# Patient Record
Sex: Female | Born: 1965
Health system: Southern US, Community
[De-identification: ages and names within clinical notes are randomized; demographics above are authoritative.]

## PROBLEM LIST (undated history)

## (undated) DIAGNOSIS — N209 Urinary calculus, unspecified: Secondary | ICD-10-CM

## (undated) DIAGNOSIS — R011 Cardiac murmur, unspecified: Secondary | ICD-10-CM

## (undated) DIAGNOSIS — K529 Noninfective gastroenteritis and colitis, unspecified: Secondary | ICD-10-CM

## (undated) DIAGNOSIS — N3010 Interstitial cystitis (chronic) without hematuria: Secondary | ICD-10-CM

## (undated) DIAGNOSIS — K589 Irritable bowel syndrome without diarrhea: Secondary | ICD-10-CM

## (undated) DIAGNOSIS — F32A Depression, unspecified: Secondary | ICD-10-CM

## (undated) DIAGNOSIS — I499 Cardiac arrhythmia, unspecified: Secondary | ICD-10-CM

## (undated) DIAGNOSIS — F419 Anxiety disorder, unspecified: Secondary | ICD-10-CM

## (undated) DIAGNOSIS — R51 Headache: Secondary | ICD-10-CM

## (undated) DIAGNOSIS — F329 Major depressive disorder, single episode, unspecified: Secondary | ICD-10-CM

## (undated) DIAGNOSIS — I341 Nonrheumatic mitral (valve) prolapse: Secondary | ICD-10-CM

## (undated) DIAGNOSIS — C801 Malignant (primary) neoplasm, unspecified: Secondary | ICD-10-CM

## (undated) HISTORY — DX: Major depressive disorder, single episode, unspecified: F32.9

## (undated) HISTORY — DX: Depression, unspecified: F32.A

## (undated) HISTORY — DX: Noninfective gastroenteritis and colitis, unspecified: K52.9

## (undated) HISTORY — DX: Malignant (primary) neoplasm, unspecified: C80.1

## (undated) HISTORY — DX: Urinary calculus, unspecified: N20.9

## (undated) HISTORY — PX: TONSILLECTOMY: SUR1361

## (undated) HISTORY — DX: Nonrheumatic mitral (valve) prolapse: I34.1

## (undated) HISTORY — DX: Irritable bowel syndrome, unspecified: K58.9

## (undated) HISTORY — DX: Interstitial cystitis (chronic) without hematuria: N30.10

## (undated) HISTORY — PX: DILATION AND CURETTAGE OF UTERUS: SHX78

---

## 1997-12-12 ENCOUNTER — Emergency Department (HOSPITAL_COMMUNITY): Admission: EM | Admit: 1997-12-12 | Discharge: 1997-12-12 | Payer: Self-pay | Admitting: Emergency Medicine

## 1997-12-21 ENCOUNTER — Emergency Department (HOSPITAL_COMMUNITY): Admission: EM | Admit: 1997-12-21 | Discharge: 1997-12-21 | Payer: Self-pay | Admitting: Emergency Medicine

## 1998-01-08 ENCOUNTER — Other Ambulatory Visit: Admission: RE | Admit: 1998-01-08 | Discharge: 1998-01-08 | Payer: Self-pay | Admitting: Obstetrics & Gynecology

## 1998-06-14 HISTORY — PX: TUBAL LIGATION: SHX77

## 2003-06-15 HISTORY — PX: APPENDECTOMY: SHX54

## 2003-06-15 HISTORY — PX: PARTIAL HYSTERECTOMY: SHX80

## 2003-06-15 HISTORY — PX: TOTAL ABDOMINAL HYSTERECTOMY: SHX209

## 2003-09-03 ENCOUNTER — Encounter: Payer: Self-pay | Admitting: Family Medicine

## 2003-09-13 ENCOUNTER — Encounter: Payer: Self-pay | Admitting: Family Medicine

## 2004-01-21 ENCOUNTER — Encounter: Payer: Self-pay | Admitting: Family Medicine

## 2004-02-07 ENCOUNTER — Encounter: Payer: Self-pay | Admitting: Family Medicine

## 2004-12-14 ENCOUNTER — Encounter: Payer: Self-pay | Admitting: Family Medicine

## 2005-06-14 HISTORY — PX: EXPLORATORY LAPAROTOMY: SUR591

## 2008-07-26 ENCOUNTER — Ambulatory Visit: Payer: Self-pay | Admitting: Family Medicine

## 2008-07-26 DIAGNOSIS — M26629 Arthralgia of temporomandibular joint, unspecified side: Secondary | ICD-10-CM | POA: Insufficient documentation

## 2008-08-17 ENCOUNTER — Ambulatory Visit: Payer: Self-pay | Admitting: Family Medicine

## 2008-08-17 DIAGNOSIS — J069 Acute upper respiratory infection, unspecified: Secondary | ICD-10-CM | POA: Insufficient documentation

## 2008-12-19 ENCOUNTER — Ambulatory Visit: Payer: Self-pay | Admitting: Family Medicine

## 2008-12-19 DIAGNOSIS — R319 Hematuria, unspecified: Secondary | ICD-10-CM | POA: Insufficient documentation

## 2008-12-19 LAB — CONVERTED CEMR LAB
Nitrite: NEGATIVE
Protein, U semiquant: 100
Urobilinogen, UA: 0.2

## 2008-12-29 ENCOUNTER — Ambulatory Visit: Payer: Self-pay | Admitting: Occupational Medicine

## 2008-12-29 DIAGNOSIS — N209 Urinary calculus, unspecified: Secondary | ICD-10-CM | POA: Insufficient documentation

## 2009-03-31 ENCOUNTER — Ambulatory Visit: Payer: Self-pay | Admitting: Occupational Medicine

## 2009-03-31 LAB — CONVERTED CEMR LAB
Bilirubin Urine: NEGATIVE
Glucose, Urine, Semiquant: NEGATIVE
Ketones, urine, test strip: NEGATIVE
Nitrite: NEGATIVE
WBC Urine, dipstick: NEGATIVE

## 2009-04-04 ENCOUNTER — Ambulatory Visit: Payer: Self-pay | Admitting: Family Medicine

## 2009-04-04 LAB — CONVERTED CEMR LAB
Bilirubin Urine: NEGATIVE
Specific Gravity, Urine: 1.025
pH: 6

## 2009-05-10 ENCOUNTER — Ambulatory Visit: Payer: Self-pay | Admitting: Family Medicine

## 2009-05-10 DIAGNOSIS — S60229A Contusion of unspecified hand, initial encounter: Secondary | ICD-10-CM | POA: Insufficient documentation

## 2009-05-16 ENCOUNTER — Encounter: Payer: Self-pay | Admitting: Cardiology

## 2009-05-16 ENCOUNTER — Ambulatory Visit: Payer: Self-pay | Admitting: Family Medicine

## 2009-05-16 ENCOUNTER — Other Ambulatory Visit: Admission: RE | Admit: 2009-05-16 | Discharge: 2009-05-16 | Payer: Self-pay | Admitting: Family Medicine

## 2009-05-16 DIAGNOSIS — I499 Cardiac arrhythmia, unspecified: Secondary | ICD-10-CM | POA: Insufficient documentation

## 2009-05-16 DIAGNOSIS — R002 Palpitations: Secondary | ICD-10-CM | POA: Insufficient documentation

## 2009-05-26 DIAGNOSIS — K259 Gastric ulcer, unspecified as acute or chronic, without hemorrhage or perforation: Secondary | ICD-10-CM | POA: Insufficient documentation

## 2009-05-26 DIAGNOSIS — Z8679 Personal history of other diseases of the circulatory system: Secondary | ICD-10-CM | POA: Insufficient documentation

## 2009-05-28 ENCOUNTER — Ambulatory Visit: Payer: Self-pay | Admitting: Cardiology

## 2009-05-28 ENCOUNTER — Encounter: Payer: Self-pay | Admitting: Cardiology

## 2009-05-28 DIAGNOSIS — F17200 Nicotine dependence, unspecified, uncomplicated: Secondary | ICD-10-CM | POA: Insufficient documentation

## 2009-05-28 DIAGNOSIS — I059 Rheumatic mitral valve disease, unspecified: Secondary | ICD-10-CM | POA: Insufficient documentation

## 2009-06-04 ENCOUNTER — Ambulatory Visit: Payer: Self-pay | Admitting: Internal Medicine

## 2009-06-04 ENCOUNTER — Ambulatory Visit (HOSPITAL_COMMUNITY): Admission: RE | Admit: 2009-06-04 | Discharge: 2009-06-04 | Payer: Self-pay | Admitting: Cardiology

## 2009-06-04 ENCOUNTER — Ambulatory Visit: Payer: Self-pay

## 2009-06-04 ENCOUNTER — Encounter: Payer: Self-pay | Admitting: Cardiology

## 2009-06-04 ENCOUNTER — Ambulatory Visit: Payer: Self-pay | Admitting: Cardiology

## 2009-06-15 ENCOUNTER — Encounter: Payer: Self-pay | Admitting: Family Medicine

## 2009-07-16 ENCOUNTER — Telehealth: Payer: Self-pay | Admitting: Cardiology

## 2009-11-04 ENCOUNTER — Telehealth: Payer: Self-pay | Admitting: Family Medicine

## 2009-11-04 DIAGNOSIS — R197 Diarrhea, unspecified: Secondary | ICD-10-CM | POA: Insufficient documentation

## 2009-11-05 ENCOUNTER — Encounter: Payer: Self-pay | Admitting: Family Medicine

## 2009-11-07 ENCOUNTER — Ambulatory Visit: Payer: Self-pay | Admitting: Family Medicine

## 2009-11-07 LAB — CONVERTED CEMR LAB
Glucose, Urine, Semiquant: NEGATIVE
Nitrite: NEGATIVE
Specific Gravity, Urine: 1.01
WBC Urine, dipstick: NEGATIVE

## 2009-11-11 LAB — CONVERTED CEMR LAB
Alkaline Phosphatase: 62 units/L (ref 39–117)
BUN: 14 mg/dL (ref 6–23)
Basophils Absolute: 0 10*3/uL (ref 0.0–0.1)
Basophils Relative: 0 % (ref 0–1)
Eosinophils Relative: 2 % (ref 0–5)
Glucose, Bld: 90 mg/dL (ref 70–99)
Hemoglobin: 14.4 g/dL (ref 12.0–15.0)
MCHC: 32.4 g/dL (ref 30.0–36.0)
Monocytes Absolute: 1 10*3/uL (ref 0.1–1.0)
Neutro Abs: 7.6 10*3/uL (ref 1.7–7.7)
Platelets: 396 10*3/uL (ref 150–400)
RDW: 13.8 % (ref 11.5–15.5)
Sodium: 141 meq/L (ref 135–145)
Total Bilirubin: 0.6 mg/dL (ref 0.3–1.2)

## 2010-02-04 ENCOUNTER — Telehealth (INDEPENDENT_AMBULATORY_CARE_PROVIDER_SITE_OTHER): Payer: Self-pay | Admitting: *Deleted

## 2010-03-01 ENCOUNTER — Encounter: Payer: Self-pay | Admitting: Family Medicine

## 2010-03-01 DIAGNOSIS — H0469 Other changes of lacrimal passages: Secondary | ICD-10-CM | POA: Insufficient documentation

## 2010-03-06 ENCOUNTER — Ambulatory Visit: Payer: Self-pay | Admitting: Family Medicine

## 2010-03-06 ENCOUNTER — Encounter: Admission: RE | Admit: 2010-03-06 | Discharge: 2010-03-06 | Payer: Self-pay | Admitting: Family Medicine

## 2010-03-06 DIAGNOSIS — N83209 Unspecified ovarian cyst, unspecified side: Secondary | ICD-10-CM | POA: Insufficient documentation

## 2010-03-06 DIAGNOSIS — N200 Calculus of kidney: Secondary | ICD-10-CM | POA: Insufficient documentation

## 2010-03-06 LAB — CONVERTED CEMR LAB
Bilirubin Urine: NEGATIVE
Ketones, urine, test strip: NEGATIVE
Protein, U semiquant: NEGATIVE
Urobilinogen, UA: 0.2

## 2010-03-07 ENCOUNTER — Encounter: Payer: Self-pay | Admitting: Family Medicine

## 2010-04-14 ENCOUNTER — Ambulatory Visit: Payer: Self-pay | Admitting: Emergency Medicine

## 2010-04-14 DIAGNOSIS — M79609 Pain in unspecified limb: Secondary | ICD-10-CM | POA: Insufficient documentation

## 2010-04-14 DIAGNOSIS — T300 Burn of unspecified body region, unspecified degree: Secondary | ICD-10-CM | POA: Insufficient documentation

## 2010-04-15 ENCOUNTER — Telehealth (INDEPENDENT_AMBULATORY_CARE_PROVIDER_SITE_OTHER): Payer: Self-pay | Admitting: *Deleted

## 2010-04-22 ENCOUNTER — Telehealth (INDEPENDENT_AMBULATORY_CARE_PROVIDER_SITE_OTHER): Payer: Self-pay | Admitting: *Deleted

## 2010-04-29 ENCOUNTER — Encounter: Payer: Self-pay | Admitting: Cardiology

## 2010-04-29 ENCOUNTER — Ambulatory Visit: Payer: Self-pay | Admitting: Cardiology

## 2010-05-27 ENCOUNTER — Encounter
Admission: RE | Admit: 2010-05-27 | Discharge: 2010-05-27 | Payer: Self-pay | Source: Home / Self Care | Attending: Family Medicine | Admitting: Family Medicine

## 2010-06-02 LAB — HM MAMMOGRAPHY: HM Mammogram: NORMAL

## 2010-06-14 HISTORY — PX: BILATERAL OOPHORECTOMY: SHX1221

## 2010-07-09 ENCOUNTER — Telehealth (INDEPENDENT_AMBULATORY_CARE_PROVIDER_SITE_OTHER): Payer: Self-pay | Admitting: *Deleted

## 2010-07-12 LAB — CONVERTED CEMR LAB
ALT: 18 units/L (ref 0–35)
AST: 18 units/L (ref 0–37)
Alkaline Phosphatase: 53 units/L (ref 39–117)
BUN: 22 mg/dL (ref 6–23)
Bilirubin Urine: NEGATIVE
Calcium: 9.6 mg/dL (ref 8.4–10.5)
Creatinine, Ser: 0.74 mg/dL (ref 0.40–1.20)
HDL: 64 mg/dL (ref >39)
Ketones, urine, test strip: NEGATIVE
LDL Cholesterol: 84 mg/dL (ref 0–99)
Specific Gravity, Urine: 1.02
TSH: 0.755 microintl units/mL (ref 0.350–4.500)
Total Bilirubin: 0.5 mg/dL (ref 0.3–1.2)
Total CHOL/HDL Ratio: 2.5
VLDL: 10 mg/dL (ref 0–40)
WBC Urine, dipstick: NEGATIVE
pH: 5.5

## 2010-07-14 NOTE — Procedures (Signed)
Summary: [facility]   Imported By: Lanelle Bal 06/26/2009 11:50:57  _____________________________________________________________________  External Attachment:    Type:   Image     Comment:   External Document

## 2010-07-14 NOTE — Assessment & Plan Note (Signed)
Summary: diarrhea   Vital Signs:  Patient profile:   45 year old Shelly Shelly status:  hysterectomy Height:      66 inches Weight:      158 pounds BMI:     Shelly.59 O2 Sat:      99 % on Room air Temp:     98.7 degrees F oral Pulse rate:   80 / minute BP sitting:   117 / 73  (left arm) Cuff size:   regular  Vitals Entered By: Payton Spark CMA (Nov 07, 2009 1:33 PM)  O2 Flow:  Room air CC: Diarrhea and abd cramping x 2 weeks.    Primary Care Provider:  Seymour Bars DO  CC:  Diarrhea and abd cramping x 2 weeks. [name]  History of Present Illness: 45 yo WF presents for 2 wks of watery stools.  She has been having about 6 loose stools in the morning, sometimes 2 in the afternoon.   Having cramps.  No fevers or chills.  She has had blood in the stool on occasion.  She had a colonoscopy 5 yrs ago in [place].  She was told that she had a parasite back then.  She works with [employer] for her occupation.  Her last scope was done at [facility]. She denies any change to her diet or new meds.  Has not taking anything OTC for symptom relief.  Denies recent camping or travel.  No sick contacts.    Current Medications (verified): 1)  Tylenol With Codeine #3 300-30 Mg Tabs (Acetaminophen-Codeine) .... As Needed 2)  Ibuprofen 200 Mg Tabs (Ibuprofen) .... As Needed 3)  Atenolol Shelly Mg Tabs (Atenolol) .... Take One Tablet By Mouth Daily  Allergies (verified): No Known Drug Allergies  Past History:  Past Medical History: Reviewed history from 06/15/2009 and no changes required. STONE, URINARY CALCULUS,UNSPEC. (ICD-592.9) HEMATURIA UNSPECIFIED (ICD-599.70) GASTRIC ULCER (ICD-531.90) cervical cancer Mitral valve prolapse MRI braine 7-06: maxillary mucous retention cyst o/w normal 2D echo 8-05: mild MVP with moderate late MVR  Past Surgical History: Reviewed history from 05/28/2009 and no changes required. Appendectomy Hysterectomy w/o oophorectomy for cancer 08 Tubal  ligation Tonsillectomy  Physical Exam  General:  alert, well-developed, well-nourished, and well-hydrated.   Head:  normocephalic and atraumatic.   Eyes:  sclera non icteric Nose:  no nasal discharge.   Mouth:  pharynx pink and moist.   Neck:  no masses.   Lungs:  Normal respiratory effort, chest expands symmetrically. Lungs are clear to auscultation, no crackles or wheezes. Heart:  Normal rate and regular rhythm. S1 and S2 normal without gallop, murmur, click, rub or other extra sounds. Abdomen:  suprapubic TTP, periumbilical guarding.  ND.  NABS.  No HSM Pulses:  2+ radial pulses Extremities:  no LE edema Skin:  color normal and no rashes.  no jaundice Cervical Nodes:  No lymphadenopathy noted   Impression & Recommendations:  Problem # 1:  DIARRHEA (ICD-787.91) 2 wks of cramping, watery stools.  Stool for O&P neg (given hx of parastite 5 yrs ago).  Checking cx and C. diff toxin.  Will empirically treat with Cipro for probable infectious colitis, possibly food borne illness.  Check CBC and CMP today.  May need refer back to [facility] for scoping if failing to improve to r/o autoimmune etiology. Treat symptoms with Bentyl and anti emetics.  Bland diet, clear fluid rehydration.  No rebound tachycardia or wt loss seen thus far. Orders: T-Clostridium difficile Toxin A/B ([id number]) T-CBC w/Diff ([id number]) T-Comprehensive  Metabolic Panel [phone])  Complete Medication List: 1)  Tylenol With Codeine #3 300-30 Mg Tabs (Acetaminophen-codeine) .... As needed 2)  Ibuprofen 200 Mg Tabs (Ibuprofen) .... As needed 3)  Atenolol Shelly Mg Tabs (Atenolol) .... Take one tablet by mouth daily 4)  Dicyclomine Hcl 20 Mg Tabs (Dicyclomine hcl) .[name].. 1 tab by mouth three times a day as needed abdominal cramping 5)  Promethazine Hcl Shelly Mg Tabs (Promethazine hcl) .[name].. 1 tab by mouth q 6 hrs as needed nausea 6)  Ciprofloxacin Hcl 500 Mg Tabs (Ciprofloxacin hcl) .[name].. 1 tab by mouth q 12 hrs x 5  days  Other Orders: UA Dipstick w/o Micro (automated)  ([id number])  Patient Instructions: 1)  Labs downstairs today. 2)  Will f/u the rest of your stool studies on Tuesday. 3)  Bland diet, clear fluids. 4)  Use Dicyclomine for cramping and Phenergan for nausea. 5)  Will go ahead and empirically treat for infectious causes with Cipro x 5 days.   6)  REturn for f/u in 10 days. Prescriptions: CIPROFLOXACIN HCL 500 MG TABS (CIPROFLOXACIN HCL) 1 tab by mouth q 12 hrs x 5 days  #10 x 0   Entered and Authorized by:   Seymour Bars DO   Signed by:   Seymour Bars DO on 11/07/2009   Method used:   Electronically to        [facility] [phone]* (retail)       [address]       [place], [place]  [address]       Ph: [phone] or [phone]       Fax: [phone]   RxID:   [id number] PROMETHAZINE HCL Shelly MG TABS (PROMETHAZINE HCL) 1 tab by mouth q 6 hrs as needed nausea  #24 x 0   Entered and Authorized by:   Seymour Bars DO   Signed by:   Seymour Bars DO on 11/07/2009   Method used:   Electronically to        [facility] [phone]* (retail)       [address]       [place], [place]  [address]       Ph: [phone] or [phone]       Fax: [phone]   RxID:   [id number] DICYCLOMINE HCL 20 MG TABS (DICYCLOMINE HCL) 1 tab by mouth three times a day as needed abdominal cramping  #30 x 0   Entered and Authorized by:   Seymour Bars DO   Signed by:   Seymour Bars DO on 11/07/2009   Method used:   Electronically to        [facility] [phone]* (retail)       [address] [place], [place]  [address]       Ph: [phone] or [phone]       Fax: [phone]   RxID:   [id number]   Laboratory Results   Urine Tests    Routine Urinalysis   Color: yellow Appearance: Clear Glucose: negative   (Normal Range: Negative) Bilirubin: negative   (Normal Range: Negative) Ketone: negative   (Normal Range: Negative) Spec. Gravity: 1.010   (Normal Range:  1.003-1.035) Blood: large   (Normal Range: Negative) pH: 6.0   (Normal Range: 5.0-8.0) Protein: negative   (Normal Range: Negative) Urobilinogen: 0.2   (Normal Range: 0-1) Nitrite: negative   (Normal Range: Negative) Leukocyte Esterace: negative   (Normal Range:  Negative)

## 2010-07-14 NOTE — Progress Notes (Signed)
  Phone Note Call from Patient   Caller: Patient Call For: [name] Reason for Call: Talk to Doctor Summary of Call: Patient called into office today to speak with nurse.   Patient cancelled her appointment today with Dr. Jens Som due to be called out of town for work.  Patient had holter monitor and turned it in 2 weeks ago.  Patient wanted to know if the results are in yet.  Please call patient at [phone].  When you call her she will reschedule the appointment.   Initial call taken by: Haskell Riling  Follow-up for Phone Call        spoke with pt, monitor was reviewed by dr Jens Som, pt aware that monitor shows sinus with PVC's.the pt states she still will occ have episodes where her heart beats real fast. she states dr Jens Som had mentioned using atenolol for her palpitations. she does not want to make a follow up appt due to insurance issues and wonders if he wants to try atenolol if she needs it. will foward to dr Jens Som for his review Shelly Goody, RN  July 16, 2009 3:39 PM  Follow-up by: Shelly Goody, RN,  July 16, 2009 3:31 PM  Additional Follow-up for Phone Call Additional follow up Details #1::        Begin atenolol 25 mg by mouth daily Ferman Hamming, MD, [facility]  July 16, 2009 4:12 PM  pt aware and will call with any problems Shelly Goody, RN  July 16, 2009 4:40 PM     New/Updated Medications: ATENOLOL 25 MG TABS (ATENOLOL) Take one tablet by mouth daily Prescriptions: ATENOLOL 25 MG TABS (ATENOLOL) Take one tablet by mouth daily  #30 x 12   Entered by:   Shelly Goody, RN   Authorized by:   Ferman Hamming, MD, [facility]   Signed by:   Shelly Goody, RN on 07/16/2009   Method used:   Electronically to        [facility] [phone]* (retail)       [address]       [place], [place]  [address]       Ph: [phone] or [phone]       Fax: [phone]   RxID:   [id number]

## 2010-07-14 NOTE — Progress Notes (Signed)
  Phone Note Outgoing Call Call back at [facility] Phone [phone]   Call placed by: Emilio Math,  April 22, 2010 11:55 AM Call placed to: Patient Summary of Call: Foot is doing a lot better!

## 2010-07-14 NOTE — Assessment & Plan Note (Signed)
Summary: [facility]   Visit Type:  Follow-up Primary Provider:  Seymour Bars DO   History of Present Illness: 45 yo female I saw in Dec 2010 for evaluation of palpitations and MVP. Echocardiogram in December of 2010 showed normal LV function, mild left ventricular hypertrophy, mild prolapse of the anterior mitral valve leaflet and mild mitral regurgitation. Since I last saw her, she denies dyspnea and chest pain. Palpitations much improved on atenolol. Occasional "skip" but no sustained palpitations.  Current Medications (verified): 1)  Atenolol 25 Mg Tabs (Atenolol) .... Take One Tablet By Mouth Daily  Allergies (verified): No Known Drug Allergies  Past History:  Past Medical History: STONE, URINARY CALCULUS,UNSPEC. (ICD-592.9) GASTRIC ULCER (ICD-531.90) cervical cancer Mitral valve prolapse  Past Surgical History: Reviewed history from 05/28/2009 and no changes required. Appendectomy Hysterectomy w/o oophorectomy for cancer 08 Tubal ligation Tonsillectomy  Social History: Reviewed history from 05/28/2009 and no changes required. Works for [employer]. smoke 1 pack per day for 20 years drinks 2 drinks per week denies recreational drug use No exercise. Engaged to  [employer].  Has 2 grown kids.  Review of Systems       no fevers or chills, productive cough, hemoptysis, dysphasia, odynophagia, melena, hematochezia, dysuria, hematuria, rash, seizure activity, orthopnea, PND, pedal edema, claudication. Remaining systems are negative.   Vital Signs:  Patient profile:   45 year old female Menstrual status:  hysterectomy Height:      66 inches Weight:      164.75 pounds BMI:     26.69 Pulse rate:   80 / minute Pulse rhythm:   regular Resp:     18 per minute BP sitting:   121 / 75  (left arm) Cuff size:   large  Vitals Entered By: Vikki Ports (April 29, 2010 4:22 PM)  Physical Exam  General:  Well-developed well-nourished in no acute distress.  Skin  is warm and dry.  HEENT is normal.  Neck is supple. No thyromegaly.  Chest is clear to auscultation with normal expansion.  Cardiovascular exam is regular rate and rhythm.  Abdominal exam nontender or distended. No masses palpated. Extremities show no edema. neuro grossly intact    EKG  Procedure date:  04/29/2010  Findings:      Normal sinus rhythm at a rate of 80. No ST changes.  Impression & Recommendations:  Problem # 1:  TOBACCO ABUSE (ICD-305.1) Patient instructed on importance of discontinuing.  Problem # 2:  MITRAL VALVE PROLAPSE (ICD-424.0)  Will need f/u echocardiograms in the future. Her updated medication list for this problem includes:    Atenolol 50 Mg Tabs (Atenolol) .[name]... Take one tablet by mouth daily  Her updated medication list for this problem includes:    Atenolol 50 Mg Tabs (Atenolol) .[name]... Take one tablet by mouth daily  Problem # 3:  PALPITATIONS (ICD-785.1)  Much improved on atenolol. Continue at present dose. Her updated medication list for this problem includes:    Atenolol 50 Mg Tabs (Atenolol) .[name]... Take one tablet by mouth daily  Her updated medication list for this problem includes:    Atenolol 50 Mg Tabs (Atenolol) .[name]... Take one tablet by mouth daily  Patient Instructions: 1)  Your physician wants you to follow-up in: one year  You will receive a reminder letter in the mail two months in advance. If you don't receive a letter, please call our office to schedule the follow-up appointment. Prescriptions: ATENOLOL 50 MG TABS (ATENOLOL) Take one tablet by mouth daily  #  90 x 4   Entered by:   [name], RN   Authorized by:   Ferman Hamming, MD, [facility]   Signed by:   [name], RN on 04/29/2010   Method used:   Electronically to        [facility] [phone]* (retail)       [address] [place], [place]  [address]       Ph: [phone]       Fax: [phone]   RxID:   [id number]

## 2010-07-14 NOTE — Progress Notes (Signed)
Summary: Cramping and diarrhea  Phone Note Call from Patient   Caller: Patient Summary of Call: Pt called stating she has had stomach cramping and diarrhea x 3 days. Pt states she has had the same symptoms when she had stones and when she had a parasite. Pt will come today to leave [facility]A and to pick up cup for stool culture and then she will return for OV on Fri. Pt unable to get off of work before then.  Initial call taken by: Payton Spark CMA,  Nov 04, 2009 1:16 PM  Follow-up for Phone Call        ok with plan, still needs OV Fri Follow-up by: Seymour Bars DO,  Nov 04, 2009 2:00 PM  New Problems: DIARRHEA (ICD-787.91)   New Problems: DIARRHEA (ICD-787.91)  Appended Document: Cramping and diarrhea Pt aware

## 2010-07-14 NOTE — Assessment & Plan Note (Signed)
Summary: UTI/TM  (left arm) Cuff size:   regular  Vitals Entered By: Areta Haber CMA (March 01, 2010 1:13 PM)                  Current Allergies: No known allergies History of Present Illness Chief Complaint: Frequent, painful urination x History of Present Illness:  Subjective:  Patient complains of 10 day history of mucous discharge from her right eye each morning when she awakens.  There has been no eye redness, swelling, foreign body sensation, itching, burning, or changes in vision.  After she arises, the symptom resolves.  She wears contacts, but only during the day.  She has a history of seasonal allergies, but usually only in the springtime.  She notes that she has been having some mild sinus congestion at night although it alternates between the two nares.  Current Problems: OTHER CHANGE OF LACRIMAL PASSAGES (ICD-375.69) DIARRHEA (ICD-787.91) TOBACCO ABUSE (ICD-305.1) MITRAL VALVE PROLAPSE (ICD-424.0) HEART MURMUR, HX OF (ICD-V12.50) PALPITATIONS (ICD-785.1) ROUTINE GYNECOLOGICAL EXAMINATION (ICD-V72.31) OTHER SCREENING MAMMOGRAM (ICD-V76.12) OTH&UNSPEC ENDOCRN NUTRIT METAB&IMMUNITY D/O (ICD-V77.99) SCREENING FOR LIPOID DISORDERS (ICD-V77.91) UNSPECIFIED CARDIAC DYSRHYTHMIA (ICD-427.9) CONTUSION, LEFT HAND (ICD-923.20) STONE, URINARY CALCULUS,UNSPEC. (ICD-592.9) HEMATURIA UNSPECIFIED (ICD-599.70) URI (ICD-465.9) TMJ PAIN (ICD-524.62) GASTRIC ULCER (ICD-531.90)   Current Meds TYLENOL WITH CODEINE #3 300-30 MG TABS (ACETAMINOPHEN-CODEINE) as needed IBUPROFEN 800 MG TABS (IBUPROFEN) Take 1 tab by mouth three times a day with food as needed ATENOLOL 25 MG TABS (ATENOLOL) Take one tablet by mouth daily  REVIEW OF SYSTEMS Constitutional Symptoms      Denies fever, chills, night sweats, weight loss, weight gain, and fatigue.  Eyes       Denies change in vision, eye pain, eye discharge, glasses, contact lenses, and eye surgery. Ear/Nose/Throat/Mouth  Denies hearing loss/aids, change in hearing, ear pain, ear discharge, dizziness, frequent runny nose, frequent nose bleeds, sinus problems, sore throat, hoarseness, and tooth pain or bleeding.  Respiratory       Denies dry cough, productive cough, wheezing, shortness of breath, asthma, bronchitis, and emphysema/COPD.  Cardiovascular       Denies murmurs, chest pain, and tires easily with exhertion.    Gastrointestinal       Complains of stomach pain.      Denies nausea/vomiting, diarrhea, constipation, blood in bowel movements, and indigestion. Genitourniary       Complains of painful urination.      Denies kidney stones and loss of urinary control. Neurological       Denies paralysis, seizures, and fainting/blackouts. Musculoskeletal       Denies muscle pain, joint pain, joint stiffness, decreased range of motion, redness, swelling, muscle weakness, and gout.  Skin       Denies bruising, unusual mles/lumps or sores, and hair/skin or nail changes.  Psych       Denies mood changes, temper/anger issues, anxiety/stress, speech problems, depression, and sleep problems.  Past History:  Past Medical History: Last updated: 06/15/2009 STONE, URINARY CALCULUS,UNSPEC. (ICD-592.9) HEMATURIA UNSPECIFIED (ICD-599.70) GASTRIC ULCER (ICD-531.90) cervical cancer Mitral valve prolapse MRI braine 7-06: maxillary mucous retention cyst o/w normal 2D echo 8-05: mild MVP with moderate late MVR  Past Surgical History: Last updated: 05/28/2009 Appendectomy Hysterectomy w/o oophorectomy for cancer 08 Tubal ligation Tonsillectomy  Family History: Last updated: 05/28/2009 mother alive age 70 healthy father alive age 60 healthy sister alive age 55 healthy No premature CAD  Social History: Last updated: 05/28/2009 Works for [employer]. smoke 1 pack per day for 20 years drinks 2  drinks per week denies recreational drug use No exercise. Engaged to  [employer].  Has 2 grown kids.   Objective:    Appearance:  Patient appears healthy, stated age, and in no acute distress  Eyes:  Pupils are equal, round, and reactive to light and accomdation.  Extraocular movement is intact.  Conjunctivae are not inflamed.  Fundi benign.  No lid swelling, eythema, or tenderness.  No discharge present. Ears:  Canals normal.  Tympanic membranes normal.   Nose:  Mildly congested turbinates on the right.  No sinus tenderness Pharynx:  Normal  Neck:  Supple.  No adenopathy is present.  No thyromegaly is present  Assessment New Problems: OTHER CHANGE OF LACRIMAL PASSAGES (ICD-375.69)  SUSPECT ALLERGIC RHINITITIS AND SINUS CONGESTION WITH CONSEQUENT LACRIMAL DUCT CONGESTION.  Plan New Orders: Est. Patient Level III [99213] Planning Comments:   Trial of Afrin nasal spray for about 5 days.  Begin saline nasal irrigation.  Recommend that she resume her allergy med (Allegra, Claritin, Zyrtec, etc).  Recommend that she massage her tear duct several times daily.  May use Refresh tears as needed. Follow-up with ophthalmologist if not improving or if symptoms worsen.   The patient and/or caregiver has been counseled thoroughly with regard to medications prescribed including dosage, schedule, interactions, rationale for use, and possible side effects and they verbalize understanding.  Diagnoses and expected course of recovery discussed and will return if not improved as expected or if the condition worsens. Patient and/or caregiver verbalized understanding.   Orders Added: 1)  Est. Patient Level III [40981]  Appended Document: UTI/TM Disregard above chart entries (and diagnosis): wrong patient data

## 2010-07-14 NOTE — Progress Notes (Signed)
  Phone Note Outgoing Call Call back at [facility] Phone [phone]   Call placed by: Lajean Saver RN,  April 15, 2010 12:21 PM Call placed to: Patient Action Taken: Appt scheduled Summary of Call: Spoke with patient, notified her of appointment with Dr. Lajoyce Corners on 04/30/10 @ 8:45AM.

## 2010-07-14 NOTE — Assessment & Plan Note (Signed)
Summary: BURN ON TOP OF FOOT/TJ   Vital Signs:  Patient Profile:   45 Years Old Female CC:      left foot chemical burn x 1 week Height:     66 inches Weight:      163 pounds O2 Sat:      98 % O2 treatment:    Room Air Temp:     98.7 degrees F oral Pulse rate:   84 / minute Resp:     12 per minute BP sitting:   131 / 80  (left arm) Cuff size:   regular  Pt. in pain?   yes    Location:   left foot    Type:       burning  Vitals Entered By: Lajean Saver RN (April 14, 2010 7:00 PM)                   Updated Prior Medication List: ATENOLOL 25 MG TABS (ATENOLOL) Take one tablet by mouth daily DOXYCYCLINE HYCLATE 100 MG CAPS (DOXYCYCLINE HYCLATE) 1 cap by mouth two times a day for 10 days SILVADENE 1 % CREA (SILVER SULFADIAZINE) apply to wound on the top of your L foot twice a day ULTRACET 37.5-325 MG TABS (TRAMADOL-ACETAMINOPHEN) 1 tab by mouth Q6 hours as needed for pain  Current Allergies: No known allergies History of Present Illness History from: patient Chief Complaint: left foot chemical burn x 1 week History of Present Illness: Top of L foot burned 1 week ago with NaOH.  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  She wears boots but it must have soaked through and sat there for 20 mins or so.  She washed it off when she realized what happened.  She has burned herself in the past on her arm and other foot, but this was worse.  She hasn't seeked out any medical care.  She has been using topical neosprorin.   It is still red but is slightly better compared to a few days ago.  She states that it is painful and she is concerned that the bone is damaged.  Her "tendons feel tight" also.  No F/C/N/V.  Last Td was Dec '10.   REVIEW OF SYSTEMS Constitutional Symptoms      Denies fever, chills, night sweats, weight loss, weight gain, and fatigue.  Eyes       Denies change in vision, eye pain, eye discharge, glasses, contact lenses,  and eye surgery. Ear/Nose/Throat/Mouth       Denies hearing loss/aids, change in hearing, ear pain, ear discharge, dizziness, frequent runny nose, frequent nose bleeds, sinus problems, sore throat, hoarseness, and tooth pain or bleeding.  Respiratory       Denies dry cough, productive cough, wheezing, shortness of breath, asthma, bronchitis, and emphysema/COPD.  Cardiovascular       Denies murmurs, chest pain, and tires easily with exhertion.    Gastrointestinal       Denies stomach pain, nausea/vomiting, diarrhea, constipation, blood in bowel movements, and indigestion. Genitourniary       Denies painful urination, kidney stones, and loss of urinary control. Neurological       Denies paralysis, seizures, and fainting/blackouts. Musculoskeletal       Denies muscle pain, joint pain, joint stiffness, decreased range of motion, redness, swelling, muscle weakness, and gout.  Skin       Denies bruising, unusual mles/lumps or sores, and hair/skin or nail changes.  Comments: chemical burn to top of left foot Psych       Denies mood changes, temper/anger issues, anxiety/stress, speech problems, depression, and sleep problems. Other Comments: Last tetanus 05/2009   Past History:  Past Medical History: Reviewed history from 03/06/2010 and no changes required. STONE, URINARY CALCULUS,UNSPEC. (ICD-592.9) GASTRIC ULCER (ICD-531.90) cervical cancer Mitral valve prolapse MRI braine 7-06: maxillary mucous retention cyst o/w normal 2D echo 8-05: mild MVP with moderate late MVR  Past Surgical History: Reviewed history from 05/28/2009 and no changes required. Appendectomy Hysterectomy w/o oophorectomy for cancer 08 Tubal ligation Tonsillectomy  Family History: Reviewed history from 05/28/2009 and no changes required. mother alive age 28 healthy father alive age 29 healthy sister alive age 95 healthy No premature CAD  Social History: Reviewed history from 05/28/2009 and no changes  required. Works for [employer]. smoke 1 pack per day for 20 years drinks 2 drinks per week denies recreational drug use No exercise. Engaged to  [employer].  Has 2 grown kids. Physical Exam General appearance: well developed, well nourished, no acute distress Heart: normal pulses Extremities: see below Neurological: normal distal sensatio Skin: see below MSE: oriented to time, place, and person L ankle: FROM, normal exam, resisted motions are mildly tender. L foot: TTP dorsal aspect with healing granulation tissue approx 4x4cm, small amount of erythema.  No active bleeding or drainage.  TTP over area.  Resisted great toe flexion is painful, resisted foot extension and toe extension also painful. Assessment New Problems: CHEMICAL BURN (ICD-949.0) FOOT PAIN, LEFT (ICD-729.5)  Xray is normal  Plan New Medications/Changes: ULTRACET 37.5-325 MG TABS (TRAMADOL-ACETAMINOPHEN) 1 tab by mouth Q6 hours as needed for pain  #20 x 0, 04/14/2010, Hoyt Koch MD SILVADENE 1 % CREA (SILVER SULFADIAZINE) apply to wound on the top of your L foot twice a day  #QS x2 wks x 0, 04/14/2010, Hoyt Koch MD DOXYCYCLINE HYCLATE 100 MG CAPS (DOXYCYCLINE HYCLATE) 1 cap by mouth two times a day for 10 days  #20 x 0, 04/14/2010, Hoyt Koch MD  New Orders: Est. Patient Level IV [16109] T-DG Foot Complete*L* [73630] Ace  Bandage < 3in. [U0454] Planning Comments:   Last Td was 1 year ago.  Will treat with Silvadene cream & Doxy for 10 days.  Refer to Dr. Lajoyce Corners for foot wound follow up.  If she's doing a lot better, can cancel appt.  Ultracet for pain but should expect pain anyway for 1-2 weeks.  I cannot predict a scar, but will likely have a small one.  The wound will probably take a few months to completely heal up.  She should have regular physician care until it is all better.  If worsening, f/u with PCP or ER.   The patient and/or caregiver has been counseled thoroughly with regard to  medications prescribed including dosage, schedule, interactions, rationale for use, and possible side effects and they verbalize understanding.  Diagnoses and expected course of recovery discussed and will return if not improved as expected or if the condition worsens. Patient and/or caregiver verbalized understanding.   PROCEDURE:  Dressing Site: L top of foot Procedure: Covered with silvadene, gauze, and wrapped with ACE bandage Prescriptions: ULTRACET 37.5-325 MG TABS (TRAMADOL-ACETAMINOPHEN) 1 tab by mouth Q6 hours as needed for pain  #20 x 0   Entered and Authorized by:   Hoyt Koch MD   Signed by:   Hoyt Koch MD on 04/14/2010   Method used:   Print then Give to Patient   RxID:  [id number] SILVADENE 1 % CREA (SILVER SULFADIAZINE) apply to wound on the top of your L foot twice a day  #QS x2 wks x 0   Entered and Authorized by:   Hoyt Koch MD   Signed by:   Hoyt Koch MD on 04/14/2010   Method used:   Print then Give to Patient   RxID:   [id number] DOXYCYCLINE HYCLATE 100 MG CAPS (DOXYCYCLINE HYCLATE) 1 cap by mouth two times a day for 10 days  #20 x 0   Entered and Authorized by:   Hoyt Koch MD   Signed by:   Hoyt Koch MD on 04/14/2010   Method used:   Print then Give to Patient   RxID:   [id number]   Orders Added: 1)  Est. Patient Level IV [71696] 2)  T-DG Foot Complete*L* [73630] 3)  Ace  Bandage < 3in. [V8938]

## 2010-07-14 NOTE — Progress Notes (Signed)
       New/Updated Medications: IBUPROFEN 800 MG TABS (IBUPROFEN) Take 1 tab by mouth three times a day with food as needed Prescriptions: IBUPROFEN 800 MG TABS (IBUPROFEN) Take 1 tab by mouth three times a day with food as needed  #60 x 0   Entered by:   Payton Spark CMA   Authorized by:   Seymour Bars DO   Signed by:   Payton Spark CMA on 02/04/2010   Method used:   Electronically to        [facility] [phone] (retail)       [address]       [place], [place]  [address]       Ph: [phone] or [phone]       Fax: [phone]   RxID:   [id number]

## 2010-07-14 NOTE — Assessment & Plan Note (Signed)
Summary: Incorrect Chart Entries  Disregard previous chart entries dated 03/01/2010:  wrong patient. Donna Christen MD  March 01, 2010 2:54 PM

## 2010-07-14 NOTE — Letter (Signed)
Summary: [facility]   Imported By: Lanelle Bal 06/26/2009 11:49:42  _____________________________________________________________________  External Attachment:    Type:   Image     Comment:   External Document

## 2010-07-14 NOTE — Assessment & Plan Note (Signed)
Summary: ED f/u   Vital Signs:  Patient profile:   45 year old female Menstrual status:  hysterectomy Height:      66 inches Weight:      162 pounds BMI:     26.24 O2 Sat:      100 % on Room air Temp:     98.4 degrees F oral Pulse rate:   69 / minute BP sitting:   125 / 79  (left arm) Cuff size:   regular  Vitals Entered By: Payton Spark CMA (March 06, 2010 2:20 PM)  O2 Flow:  Room air CC: F/U ED ovarian cyst and ? kidney stone   Primary Care Provider:  Seymour Bars DO  CC:  F/U ED ovarian cyst and ? kidney stone.  History of Present Illness: 45 yo WF presents for HFU visit.  She was diagnosed with a L ovarian cyst based on a CT scan that showed maybe a kidney stone.  She is s/p TAH but still has her ovaries.  Denies vaginal bleeding.  Using Ibuprofen and Tylenol #3.  L pelvic pain is almost completeley resolved.  Yesterday, she started having R and midline pelvic pain with urinary frequency and dysuria.  Denies gross hematuria, fevers, chills or flank pain.      Current Medications (verified): 1)  Tylenol With Codeine #3 300-30 Mg Tabs (Acetaminophen-Codeine) .... As Needed 2)  Ibuprofen 800 Mg Tabs (Ibuprofen) .... Take 1 Tab By Mouth Three Times A Day With Food As Needed 3)  Atenolol 25 Mg Tabs (Atenolol) .... Take One Tablet By Mouth Daily  Allergies (verified): No Known Drug Allergies  Past History:  Past Medical History: STONE, URINARY CALCULUS,UNSPEC. (ICD-592.9) GASTRIC ULCER (ICD-531.90) cervical cancer Mitral valve prolapse MRI braine 7-06: maxillary mucous retention cyst o/w normal 2D echo 8-05: mild MVP with moderate late MVR  Past Surgical History: Reviewed history from 05/28/2009 and no changes required. Appendectomy Hysterectomy w/o oophorectomy for cancer 08 Tubal ligation Tonsillectomy  Social History: Reviewed history from 05/28/2009 and no changes required. Works for [employer]. smoke 1 pack per day for 20 years drinks 2 drinks  per week denies recreational drug use No exercise. Engaged to  [employer].  Has 2 grown kids.  Review of Systems      See HPI  Physical Exam  General:  alert, well-developed, well-nourished, and well-hydrated.   Head:  normocephalic and atraumatic.   Eyes:  sclera non icteric Mouth:  pharynx pink and moist.   Neck:  no masses.   Lungs:  Normal respiratory effort, chest expands symmetrically. Lungs are clear to auscultation, no crackles or wheezes. Heart:  Normal rate and regular rhythm. S1 and S2 normal without gallop, murmur, click, rub or other extra sounds. Abdomen:  RLQ TTP with vol guarding, suprapubic TTP.  No LLQ TTP or guarding.  soft.  no CVAT.   Extremities:  no LE edema Skin:  color normal.   Cervical Nodes:  No lymphadenopathy noted Psych:  good eye contact, not anxious appearing, and not depressed appearing.     Impression & Recommendations:  Problem # 1:  NEPHROLITHIASIS (ICD-592.0) Has Tylenol #3 and RX Ibuprofen.  UA is ++ for large blood. KUB to look for recurrent stone, Ucx to look for infection. If not improving, refer to urology. Orders: T-DG ABD 1 View (74000)  Problem # 2:  HEMATURIA UNSPECIFIED (ICD-599.70) Kidney stone vs UTI with hx of stones. Ucx sent.  Empirically cover with Macrobid.  KUB today to look for radio -  opague stone given quantity of RLQ pain today.   Her updated medication list for this problem includes:    Nitrofurantoin Monohyd Macro 100 Mg Caps (Nitrofurantoin monohyd macro) .[name]... 1 capsule by mouth two times a day x 7 days  Orders: UA Dipstick w/o Micro (automated)  (81003) T-Culture, Urine ([id number])  Problem # 3:  OVARIAN CYST (ICD-620.2) L ovarian cyst, confirmed on CT scan in the ED. Spent 10 min reviewing records. Pain is much improved.    Complete Medication List: 1)  Tylenol With Codeine #3 300-30 Mg Tabs (Acetaminophen-codeine) .... As needed 2)  Ibuprofen 800 Mg Tabs (Ibuprofen) .... Take 1 tab by mouth three times  a day with food as needed 3)  Atenolol 25 Mg Tabs (Atenolol) .... Take one tablet by mouth daily 4)  Nitrofurantoin Monohyd Macro 100 Mg Caps (Nitrofurantoin monohyd macro) .[name].. 1 capsule by mouth two times a day x 7 days  Patient Instructions: 1)  Xray abdomen today. 2)  Will call you w/ results tonight. 3)  Use RX pain meds/ ibuprofen as needed. 4)  Will culture your urine, but go ahead and start on Macrobid to cover for infection. Prescriptions: NITROFURANTOIN MONOHYD MACRO 100 MG CAPS (NITROFURANTOIN MONOHYD MACRO) 1 capsule by mouth two times a day x 7 days  #14 x 0   Entered and Authorized by:   Seymour Bars DO   Signed by:   Seymour Bars DO on 03/06/2010   Method used:   Electron[facility] to        [facility] [phone]* (retail)       [address] [place], [place]  [address]       Ph: [phone] or [phone]       Fax: [phone]   RxID:   [id number]   Laboratory Results   Urine Tests    Routine Urinalysis   Color: yellow Appearance: Clear Glucose: negative   (Normal Range: Negative) Bilirubin: negative   (Normal Range: Negative) Ketone: negative   (Normal Range: Negative) Spec. Gravity: 1.020   (Normal Range: 1.003-1.035) Blood: large   (Normal Range: Negative) pH: 6.5   (Normal Range: 5.0-8.0) Protein: negative   (Normal Range: Negative) Urobilinogen: 0.2   (Normal Range: 0-1) Nitrite: negative   (Normal Range: Negative) Leukocyte Esterace: negative   (Normal Range: Negative)

## 2010-07-14 NOTE — Miscellaneous (Signed)
Summary: old records  Clinical Lists Changes  Observations: Added new observation of PAST MED HX: STONE, URINARY CALCULUS,UNSPEC. (ICD-592.9) HEMATURIA UNSPECIFIED (ICD-599.70) GASTRIC ULCER (ICD-531.90) cervical cancer Mitral valve prolapse MRI braine 7-06: maxillary mucous retention cyst o/w normal 2D echo 8-05: mild MVP with moderate late MVR (06/15/2009 13:11) Added new observation of PRIMARY MD: Seymour Bars DO (06/15/2009 13:11)       Past History:  Past Medical History: STONE, URINARY CALCULUS,UNSPEC. (ICD-592.9) HEMATURIA UNSPECIFIED (ICD-599.70) GASTRIC ULCER (ICD-531.90) cervical cancer Mitral valve prolapse MRI braine 7-06: maxillary mucous retention cyst o/w normal 2D echo 8-05: mild MVP with moderate late MVR

## 2010-07-16 NOTE — Progress Notes (Signed)
       New/Updated Medications: IBUPROFEN 800 MG TABS (IBUPROFEN) Take 1 tab by mouth three times a day  as needed Prescriptions: IBUPROFEN 800 MG TABS (IBUPROFEN) Take 1 tab by mouth three times a day  as needed  #60 x 1   Entered by:   Payton Spark CMA   Authorized by:   Seymour Bars DO   Signed by:   Payton Spark CMA on 07/09/2010   Method used:   Electronically to        [facility] [phone]* (retail)       [address]       [place], [place]  [address]       Ph: [phone] or [phone]       Fax: [phone]   RxID:   [id number]

## 2010-11-06 ENCOUNTER — Encounter: Payer: Self-pay | Admitting: Family Medicine

## 2010-11-06 ENCOUNTER — Ambulatory Visit (INDEPENDENT_AMBULATORY_CARE_PROVIDER_SITE_OTHER): Payer: BC Managed Care – PPO | Admitting: Family Medicine

## 2010-11-06 VITALS — BP 108/66 | HR 72 | Temp 98.8°F | Ht 66.0 in | Wt 165.0 lb

## 2010-11-06 DIAGNOSIS — N200 Calculus of kidney: Secondary | ICD-10-CM

## 2010-11-06 DIAGNOSIS — F3289 Other specified depressive episodes: Secondary | ICD-10-CM

## 2010-11-06 DIAGNOSIS — F329 Major depressive disorder, single episode, unspecified: Secondary | ICD-10-CM

## 2010-11-06 LAB — POCT URINALYSIS DIPSTICK
Bilirubin, UA: NEGATIVE
Glucose, UA: NEGATIVE
Nitrite, UA: NEGATIVE
pH, UA: 6

## 2010-11-06 MED ORDER — SERTRALINE HCL 50 MG PO TABS: ORAL_TABLET | ORAL | Status: DC

## 2010-11-06 MED ORDER — TAMSULOSIN HCL 0.4 MG PO CAPS: 0.4000 mg | ORAL_CAPSULE | Freq: Every day | ORAL | Status: DC

## 2010-11-06 MED ORDER — OXYCODONE-ACETAMINOPHEN 5-325 MG PO TABS: ORAL_TABLET | ORAL | Status: DC

## 2010-11-06 NOTE — Progress Notes (Signed)
  Subjective:    Patient ID: [name], female    DOB: 07-05-1965, 45 y.o.   MRN: [id number]  HPI 45 yo WF presents for pelvic and R flank pain that occurred at the end of the week last wk.  She was in [place] and was diagnosed with several kidney stones on CT in the ED.  She has some mild nausea but no vomitting.  She is in pain and vicodin is not helping.  She has  Had many stones in the past.  Denies gross hematuria.  vicodin not helping.  Drinking lots of water.  Denies back pain, fevers or chills.  She also feels depressed and overwhelmed, mostly by stressors at work.  She is ready to start something to take so that she is not so emotional.  She is looking for work elsewhere.  BP 108/66  Pulse 72  Temp(Src) 98.8 F (37.1 C) (Oral)  Ht 5\' 6"  (1.676 m)  Wt 165 lb (74.844 kg)  BMI 26.63 kg/m2  SpO2 98% Patient Active Problem List  Diagnoses  . TOBACCO ABUSE  . OTHER CHANGE OF LACRIMAL PASSAGES  . MITRAL VALVE PROLAPSE  . UNSPECIFIED CARDIAC DYSRHYTHMIA  . URI  . TMJ PAIN  . GASTRIC ULCER  . NEPHROLITHIASIS  . STONE, URINARY CALCULUS,UNSPEC.  [name] HEMATURIA UNSPECIFIED  . OVARIAN CYST  . FOOT PAIN, LEFT  . PALPITATIONS  . DIARRHEA  . CONTUSION, LEFT HAND  . CHEMICAL BURN  . HEART MURMUR, HX OF       Review of Systems  Constitutional: Negative for fever, appetite change, fatigue and unexpected weight change.  Respiratory: Negative for shortness of breath.   Cardiovascular: Negative for chest pain.  Gastrointestinal: Positive for nausea, abdominal pain and diarrhea. Negative for vomiting.  Genitourinary: Positive for dysuria, frequency, flank pain and pelvic pain. Negative for urgency, hematuria and vaginal bleeding.  Psychiatric/Behavioral: Positive for sleep disturbance and dysphoric mood. Negative for suicidal ideas and self-injury. The patient is nervous/anxious.        Objective:   Physical Exam  Constitutional: She appears well-developed and well-nourished. No  distress.  Eyes: No scleral icterus.  Neck: Neck supple.  Cardiovascular: Normal rate, regular rhythm and normal heart sounds.        Mid sytstolic click  Pulmonary/Chest: Effort normal and breath sounds normal.  Abdominal: Soft. Bowel sounds are normal. She exhibits no distension and no mass. There is tenderness (midline suprapubic TTP, no CVAT). There is guarding.  Musculoskeletal: She exhibits no edema.  Skin: Skin is warm and dry.  Psychiatric: She has a normal mood and affect.       tearful          Assessment & Plan:

## 2010-11-06 NOTE — Assessment & Plan Note (Signed)
Under a lot of stress and is emotional and also going thru perimenopause.  She really wants to take something to even her mood out.  Will try Zoloft once daily.  Call if any problems o/w f/u in 6 wks.

## 2010-11-06 NOTE — Assessment & Plan Note (Signed)
Reviewed Ct results, hosp notes.  UA grossly + for blood here today.  She is likely still passing small stones.  Treat her pain with percocet since vicodin is not helping.  Has rx for zofran prn nausea.  Continue (RF) flomax at night for ureteral diliation.  Hydrate well.  If not improving by mid wk, will repeat a CT urogram.

## 2010-11-06 NOTE — Patient Instructions (Signed)
UA ++ for blood.  Take Percocet as needed for pain -- caution about sedation and constipation.  Use Flomax at night.  Call if symptoms have not cleared by Wednesday.  Will get a CT urogram on Thursday if you still have symptoms.

## 2010-11-11 ENCOUNTER — Telehealth: Payer: Self-pay | Admitting: Family Medicine

## 2010-11-11 NOTE — Telephone Encounter (Addendum)
Pt called after 4:00pm, however; calls checked and due to the nature of the call triage nurse routed after hours to the provider.  Pt talked to [place] earlier today ( Dr. Cathey Endow medical asst), and she was told if the pain didn't get better to call The office back.  Pain in front, and on the right side.  Tried to reach the patient at 6:58pm to get details, but had to [facility] for the patient to go to UC, ED if pain doubling her over, and if she felt she could wait til the morning to call triage nurse shortly after 8am. Plan:  Routed to Dr. Arlice Colt, LPN [facility] Dimes  [birth year] Pt notified to see how she was doing .  Not as bad as Tues and Wed of this week, but still uncomfortable.  Pain level #4/10.  Pt did not go to ED last night since she has a history of kidney stones.   Plan:  Told pt we need to get CT Urogram.  Since Dr. Cathey Endow out of the office and non reachable today will route this to Dr. Linford Arnold to order the test.  Told pt we will call her back with instructions. Jarvis Newcomer, LPN [facility] Dimes  Routed to Dr. Linford Arnold

## 2010-11-11 NOTE — Telephone Encounter (Signed)
Pls call pt back Thursday morning.  If she did not go to the ED Wed night, will go ahead and get a CT urogram to see where her stone is.  She has RX pain meds, Flomax and anti nausea meds already.  Find out how she is feeling now.  Thanks.

## 2010-11-17 NOTE — Telephone Encounter (Signed)
OK.  So if I understand correctly, she is much improved.  OK to stop meds for kidney stone and call me if any problems.

## 2010-11-17 NOTE — Telephone Encounter (Signed)
Pt had already been contacted to get an update.  Acknowledged that Dr. Cathey Endow had received message and this encounter was closed. Jarvis Newcomer, LPN Domingo Dimes

## 2010-11-17 NOTE — Telephone Encounter (Signed)
Pt did not respond by calling back to the office on Thursday of last week.  Notified the pt today to check on her since no response and she said she must of passed the stone on Friday because she got 100% improved.  Pt did not go to the ED.  Wants to hold off on the CT urogram for now. Plan:  Routed to Dr. Cathey Endow so she would know patient passed the stone. Jarvis Newcomer, LPN Domingo Dimes

## 2010-12-14 ENCOUNTER — Telehealth: Payer: Self-pay | Admitting: Cardiology

## 2010-12-14 ENCOUNTER — Encounter: Payer: Self-pay | Admitting: *Deleted

## 2010-12-14 NOTE — Telephone Encounter (Signed)
Pt needs a letter or note stating she can drive [facility] fax to # [phone] [facility].

## 2010-12-14 NOTE — Telephone Encounter (Signed)
Spoke with pt, she needs a note stating she is okay to drive [facility] with no restrictions, noter to be faxed to number provided [name]

## 2010-12-15 ENCOUNTER — Encounter: Payer: Self-pay | Admitting: *Deleted

## 2010-12-15 ENCOUNTER — Telehealth: Payer: Self-pay | Admitting: Cardiology

## 2010-12-15 NOTE — Telephone Encounter (Signed)
They did a DOT physical and they need something stating her symptoms and ability to work and the note you sent was not good they need more info with her dx and her job title and other things need to be corrected as well

## 2010-12-15 NOTE — Telephone Encounter (Signed)
Spoke with [name], they need a note from dr Jens Som reporting pts diagnosis and last she is okay to drive. Will discuss with dr Jens Som [name]

## 2010-12-15 NOTE — Telephone Encounter (Signed)
Discussed with dr Jens Som, will compose letter and fax to [place] at [phone] [name]

## 2010-12-23 ENCOUNTER — Telehealth: Payer: Self-pay | Admitting: *Deleted

## 2010-12-23 NOTE — Telephone Encounter (Signed)
Pt states she tried to call last week leaving a cpl messages but never got a return call. Pt has been doing well on the zoloft but felt that it needed increased so she is now taking 1 1/2 tabs daily. Is this OK?

## 2010-12-24 NOTE — Telephone Encounter (Signed)
Pt aware and apt scheduled  °

## 2010-12-24 NOTE — Telephone Encounter (Signed)
OK to stay on 1.5 tabs/ day.  Set up OV with me next Thursday morning if she can.

## 2010-12-27 ENCOUNTER — Encounter: Payer: Self-pay | Admitting: Family Medicine

## 2011-01-08 ENCOUNTER — Ambulatory Visit (INDEPENDENT_AMBULATORY_CARE_PROVIDER_SITE_OTHER): Payer: BC Managed Care – PPO | Admitting: Family Medicine

## 2011-01-08 ENCOUNTER — Ambulatory Visit: Payer: BC Managed Care – PPO | Admitting: Family Medicine

## 2011-01-08 ENCOUNTER — Encounter: Payer: Self-pay | Admitting: Family Medicine

## 2011-01-08 DIAGNOSIS — F329 Major depressive disorder, single episode, unspecified: Secondary | ICD-10-CM

## 2011-01-08 DIAGNOSIS — L678 Other hair color and hair shaft abnormalities: Secondary | ICD-10-CM

## 2011-01-08 DIAGNOSIS — F3289 Other specified depressive episodes: Secondary | ICD-10-CM

## 2011-01-08 DIAGNOSIS — L738 Other specified follicular disorders: Secondary | ICD-10-CM

## 2011-01-08 MED ORDER — SERTRALINE HCL 50 MG PO TABS: ORAL_TABLET | ORAL | Status: DC

## 2011-01-08 MED ORDER — DOXYCYCLINE HYCLATE 100 MG PO CAPS: 100.0000 mg | ORAL_CAPSULE | Freq: Two times a day (BID) | ORAL | Status: AC

## 2011-01-08 NOTE — Progress Notes (Signed)
  Subjective:    Patient ID: [name], female    DOB: [birth year], 45 y.o.   MRN: [id number]  HPI  45 yo WF presents for f/u anxiety and depression.  She is doing well on Zoloft, increased to 75 mg 2 wks ago.  She feels like it is really helping.  Able to handle stressors better.   She has had panful red ? Boils on both sides of her inguinal creases where the elastic on her underwear rubs.  This started over a month ago with the summer heat.  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  The lesions have drained some fluid.  She has mild itching.  No fevers or chills.  BP 113/61  Pulse 78  Ht 5\' 6"  (1.676 m)  Wt 158 lb (71.668 kg)  BMI 25.50 kg/m2  SpO2 96%    Review of Systems  Constitutional: Negative for fever and chills.  Psychiatric/Behavioral: Negative for suicidal ideas, sleep disturbance and dysphoric mood. The patient is not nervous/anxious.        Objective:   Physical Exam  Constitutional: She appears well-developed and well-nourished.  Cardiovascular: Normal rate, regular rhythm and normal heart sounds.   Pulmonary/Chest: Effort normal and breath sounds normal.  Abdominal:       No inguinal lymphadenopathy  Skin:       Erythematous follicular pustules on R and L inguinal folds under panty elastic, none draning or indurated.  Psychiatric: She has a normal mood and affect.          Assessment & Plan:   No problem-specific assessment & plan notes found for this encounter.

## 2011-01-08 NOTE — Patient Instructions (Addendum)
Stay on Zoloft 1 and a half tabs/ day.  Take Doxycycline in the morning and at night for folliculitis. Use Vasoline or Aquaphor as a skin protectant during the day.  Try to wear a dry fit compression short to protect skin from polyester pants.  Call me if lesions to not clear after 7 days.

## 2011-01-09 DIAGNOSIS — L738 Other specified follicular disorders: Secondary | ICD-10-CM | POA: Insufficient documentation

## 2011-01-09 NOTE — Assessment & Plan Note (Signed)
Much improved.  Continue 1.5 tabs (75 mg) of Zoloft daily.  RFd.

## 2011-01-09 NOTE — Assessment & Plan Note (Signed)
Exacerbated from sweating.  Will treat with doxycycline x 10 days. Clean skin with antibacterial soap and water and try to protect from fritciton and sweat with topical vasoline as a skin protectant and try a dri fit spandex breif under work pants.  Let me know if not improving in the next 7-10 days

## 2011-01-25 ENCOUNTER — Other Ambulatory Visit: Payer: Self-pay | Admitting: *Deleted

## 2011-01-30 ENCOUNTER — Encounter: Payer: Self-pay | Admitting: Emergency Medicine

## 2011-01-30 ENCOUNTER — Inpatient Hospital Stay (INDEPENDENT_AMBULATORY_CARE_PROVIDER_SITE_OTHER)
Admission: RE | Admit: 2011-01-30 | Discharge: 2011-01-30 | Disposition: A | Discharge status: Home / Self Care | Payer: BC Managed Care – PPO | Source: Ambulatory Visit | Attending: Emergency Medicine | Admitting: Emergency Medicine

## 2011-01-30 DIAGNOSIS — J029 Acute pharyngitis, unspecified: Secondary | ICD-10-CM | POA: Insufficient documentation

## 2011-01-30 LAB — CONVERTED CEMR LAB: Rapid Strep: NEGATIVE

## 2011-02-01 ENCOUNTER — Telehealth (INDEPENDENT_AMBULATORY_CARE_PROVIDER_SITE_OTHER): Payer: Self-pay | Admitting: *Deleted

## 2011-02-04 ENCOUNTER — Ambulatory Visit
Admission: RE | Admit: 2011-02-04 | Discharge: 2011-02-04 | Disposition: A | Discharge status: Home / Self Care | Payer: BC Managed Care – PPO | Source: Ambulatory Visit | Attending: Family Medicine | Admitting: Family Medicine

## 2011-02-04 ENCOUNTER — Other Ambulatory Visit: Payer: Self-pay | Admitting: Family Medicine

## 2011-02-04 ENCOUNTER — Encounter: Payer: Self-pay | Admitting: Family Medicine

## 2011-02-04 ENCOUNTER — Inpatient Hospital Stay (INDEPENDENT_AMBULATORY_CARE_PROVIDER_SITE_OTHER)
Admission: RE | Admit: 2011-02-04 | Discharge: 2011-02-04 | Disposition: A | Discharge status: Home / Self Care | Payer: BC Managed Care – PPO | Source: Ambulatory Visit | Attending: Family Medicine | Admitting: Family Medicine

## 2011-02-04 DIAGNOSIS — R059 Cough, unspecified: Secondary | ICD-10-CM

## 2011-02-04 DIAGNOSIS — R319 Hematuria, unspecified: Secondary | ICD-10-CM

## 2011-02-04 DIAGNOSIS — R109 Unspecified abdominal pain: Secondary | ICD-10-CM

## 2011-02-04 DIAGNOSIS — J209 Acute bronchitis, unspecified: Secondary | ICD-10-CM

## 2011-02-04 DIAGNOSIS — R05 Cough: Secondary | ICD-10-CM

## 2011-02-04 LAB — CONVERTED CEMR LAB
Glucose, Urine, Semiquant: NEGATIVE
Ketones, urine, test strip: NEGATIVE
Specific Gravity, Urine: 1.015
Urobilinogen, UA: 0.2

## 2011-02-07 ENCOUNTER — Telehealth (INDEPENDENT_AMBULATORY_CARE_PROVIDER_SITE_OTHER): Payer: Self-pay

## 2011-05-17 NOTE — Telephone Encounter (Signed)
  Phone Note Call from Patient   Caller: Patient Summary of Call: pts fiance called and states that after the pt spoke with the nurse earlier today she began to feel hot and her eyes are burning. advised him to tell her to stop the ABT and prednisone, follow up here or with her PCP today. pts fiance agrees.  Initial call taken by: Clemens Catholic LPN,  February 01, 2011 3:25 PM

## 2011-05-17 NOTE — Telephone Encounter (Signed)
  Phone Note Outgoing Call   Call placed by: Linton Flemings RN,  February 07, 2011 4:35 PM Call placed to: Patient Summary of Call: F/U CALL TO PT, MSG LEFT TOCALL FACILITY WITH QUESTIONS/CONCERNS

## 2011-05-17 NOTE — Letter (Signed)
Summary: Out of Work  [facility]  [address]   [place], [place] [address]   Phone: [phone]  Fax: [phone]    February 04, 2011   Employee:  [name]    To Whom It May Concern:   For Medical reasons, please excuse the above named employee from work for the following dates:  Start:   02/04/2011  Return:   02/06/2011  If you need additional information, please feel free to contact our office.         Sincerely,    Hassan Rowan MD

## 2011-05-17 NOTE — Progress Notes (Signed)
Summary: sore throat/TM   Vital Signs:  Patient Profile:   45 Years Old Female CC:      sore throat (hx STrep) Height:     66 inches Weight:      152.50 pounds O2 Sat:      97 % O2 treatment:    Room Air Temp:     98.6 degrees F oral Pulse rate:   68 / minute Resp:     16 per minute BP sitting:   96 / 65  (left arm) Cuff size:   regular  Pt. in pain?   yes    Location:   throat  Vitals Entered By: Lavell Islam RN (January 30, 2011 11:41 AM)                   Updated Prior Medication List: ATENOLOL 50 MG TABS (ATENOLOL) Take one tablet by mouth daily IBUPROFEN 800 MG TABS (IBUPROFEN) Take 1 tab by mouth three times a day  as needed  Current Allergies: No known allergies History of Present Illness History from: patient Chief Complaint: sore throat (hx STrep) History of Present Illness: 45 Years Old Female complains of onset of cold symptoms for 1 days.  Not using any meds. + sore throat + cough + hoarseness No pleuritic pain No wheezing + nasal congestion + post-nasal drainage No sinus pain/pressure No chest congestion No itchy/red eyes No earache No hemoptysis + hurts to swallow No chills/sweats No fever No nausea No vomiting No abdominal pain No diarrhea No skin rashes +fatigue + myalgias + headache   REVIEW OF SYSTEMS Constitutional Symptoms       Complains of fatigue.     Denies fever, chills, night sweats, weight loss, and weight gain.  Eyes       Denies change in vision, eye pain, eye discharge, glasses, contact lenses, and eye surgery. Ear/Nose/Throat/Mouth       Complains of frequent runny nose, sore throat, and hoarseness.      Denies hearing loss/aids, change in hearing, ear pain, ear discharge, dizziness, frequent nose bleeds, sinus problems, and tooth pain or bleeding.  Respiratory       Complains of dry cough and shortness of breath.      Denies productive cough, wheezing, asthma, bronchitis, and emphysema/COPD.  Cardiovascular  Denies murmurs, chest pain, and tires easily with exhertion.    Gastrointestinal       Denies stomach pain, nausea/vomiting, diarrhea, constipation, blood in bowel movements, and indigestion. Genitourniary       Denies painful urination, kidney stones, and loss of urinary control. Neurological       Denies paralysis, seizures, and fainting/blackouts. Musculoskeletal       Denies muscle pain, joint pain, joint stiffness, decreased range of motion, redness, swelling, muscle weakness, and gout.  Skin       Denies bruising, unusual mles/lumps or sores, and hair/skin or nail changes.  Psych       Denies mood changes, temper/anger issues, anxiety/stress, speech problems, depression, and sleep problems. Other Comments: sore throat   Past History:  Family History: Last updated: 05/28/2009 mother alive age 5 healthy father alive age 73 healthy sister alive age 55 healthy No premature CAD  Social History: Last updated: 05/28/2009 Works for [employer]. smoke 1 pack per day for 20 years drinks 2 drinks per week denies recreational drug use No exercise. Engaged to  [employer].  Has 2 grown kids.  Past Medical History: Reviewed history from 04/29/2010 and no changes required.  STONE, URINARY CALCULUS,UNSPEC. (ICD-592.9) GASTRIC ULCER (ICD-531.90) cervical cancer Mitral valve prolapse  Past Surgical History: Reviewed history from 05/28/2009 and no changes required. Appendectomy Hysterectomy w/o oophorectomy for cancer 08 Tubal ligation Tonsillectomy  Family History: Reviewed history from 05/28/2009 and no changes required. mother alive age 71 healthy father alive age 56 healthy sister alive age 28 healthy No premature CAD  Social History: Reviewed history from 05/28/2009 and no changes required. Works for [employer]. smoke 1 pack per day for 20 years drinks 2 drinks per week denies recreational drug use No exercise. Engaged to  [employer].  Has 2 grown kids. Physical  Exam General appearance: well developed, well nourished, no acute distress Ears: normal, no lesions or deformities Nasal: mucosa pink, nonedematous, no septal deviation, turbinates normal Oral/Pharynx: pharyngeal erythema without exudate, uvula midline without deviation Chest/Lungs: no rales, wheezes, or rhonchi bilateral, breath sounds equal without effort Heart: regular rate and  rhythm, no murmur MSE: oriented to time, place, and person Assessment New Problems: ACUTE PHARYNGITIS (ICD-462) UPPER RESPIRATORY INFECTION, ACUTE (ICD-465.9)   Plan New Medications/Changes: PREDNISONE (PAK) 10 MG TABS (PREDNISONE) 6 day pack, use as directed  #1 x 0, 01/30/2011, Hoyt Koch MD AMOXICILLIN 875 MG TABS (AMOXICILLIN) 1 by mouth two times a day for 8 days  #16 x 0, 01/30/2011, Hoyt Koch MD  New Orders: Est. Patient Level III [96045] Rapid Strep [40981] T-Culture, Throat [[id number]] Planning Comments:   1)  Take the prescribed antibiotic as instructed.  Rx for prednisone for symptomatic relief 2)  Use nasal saline solution (over the counter) at least 3 times a day. 3)  Use over the counter decongestants like Zyrtec-D every 12 hours as needed to help with congestion. 4)  Can take tylenol every 6 hours or motrin every 8 hours for pain or fever. 5)  Follow up with your primary doctor  if no improvement in 5-7 days, sooner if increasing pain, fever, or new symptoms.    The patient and/or caregiver has been counseled thoroughly with regard to medications prescribed including dosage, schedule, interactions, rationale for use, and possible side effects and they verbalize understanding.  Diagnoses and expected course of recovery discussed and will return if not improved as expected or if the condition worsens. Patient and/or caregiver verbalized understanding.  Prescriptions: PREDNISONE (PAK) 10 MG TABS (PREDNISONE) 6 day pack, use as directed  #1 x 0   Entered and Authorized by:    Hoyt Koch MD   Signed by:   Hoyt Koch MD on 01/30/2011   Method used:   Electronically to        [facility] [phone]* (retail)       [address] [place], [place]  [address]       Ph: [phone]       Fax: [phone]   RxID:   [id number] AMOXICILLIN 875 MG TABS (AMOXICILLIN) 1 by mouth two times a day for 8 days  #16 x 0   Entered and Authorized by:   Hoyt Koch MD   Signed by:   Hoyt Koch MD on 01/30/2011   Method used:   Electronically to        [facility] [phone]* (retail)       [address] [place], [place]  [address]       Ph: [phone]       Fax: [phone]   RxID:   [id number]  Orders Added: 1)  Est. Patient Level III [16109] 2)  Rapid Strep [60454] 3)  T-Culture, Throat [[id number]]    Laboratory Results  Date/Time Received: January 30, 2011 11:50 AM  Date/Time Reported: January 30, 2011 11:50 AM   Other Tests  Rapid Strep: negative  Kit Test Internal QC: Negative   (Normal Range: Negative)

## 2011-05-17 NOTE — Telephone Encounter (Signed)
  Phone Note Outgoing Call Call back at [facility] Phone [phone]   Call placed by: Lajean Saver RN,  February 01, 2011 2:18 PM Call placed to: Patient Action Taken: Phone Call Completed Summary of Call: Callback: Patient reports she is not doing nay better. She came home early from work today. Negative throat culutre result given. Since she is still symptomatic adivsed to finish out the antibiotic and prednisone. Take zyrtec -D for congestion

## 2011-05-17 NOTE — Progress Notes (Signed)
Summary:  Worsening cough, UTI, Room 4   Vital Signs:  Patient Profile:   45 Years Old Female CC:      Cough worsening since Monday, Dysuria x 3 days Height:     66 inches Weight:      152 pounds O2 Sat:      97 % O2 treatment:    Room Air Temp:     99.0 degrees F oral Pulse rate:   67 / minute Pulse rhythm:   regular Resp:     16 per minute BP sitting:   112 / 74  (left arm) Cuff size:   regular  Vitals Entered By: Emilio Math (February 04, 2011 10:43 AM)                  Prior Medication List:  ATENOLOL 50 MG TABS (ATENOLOL) Take one tablet by mouth daily IBUPROFEN 800 MG TABS (IBUPROFEN) Take 1 tab by mouth three times a day  as needed AMOXICILLIN 875 MG TABS (AMOXICILLIN) 1 by mouth two times a day for 8 days PREDNISONE (PAK) 10 MG TABS (PREDNISONE) 6 day pack, use as directed   Current Allergies: No known allergies History of Present Illness Chief Complaint: Cough worsening since Monday, Dysuria x 3 days History of Present Illness: Patient reports being seen Saturday for sore throat and cough but not getting any better. She reports that the coughing has gotten worse despite the Amoxicillin.  She does smoke but has since getting sick. Her strep test as negative.  She also reports dysuria and L flank pain. She does have a HX of frequent stones. She has had a hysterectomy and an appendectomy.   Current Problems: ABDOMINAL PAIN, ACUTE (ICD-789.00) HEMATURIA UNSPECIFIED (ICD-599.70) BRONCHITIS, ACUTE (ICD-466.0) UPPER RESPIRATORY INFECTION, ACUTE (ICD-465.9) ACUTE PHARYNGITIS (ICD-462) UPPER RESPIRATORY INFECTION, ACUTE (ICD-465.9) CHEMICAL BURN (ICD-949.0) FOOT PAIN, LEFT (ICD-729.5) OVARIAN CYST (ICD-620.2) NEPHROLITHIASIS (ICD-592.0) HEMATURIA UNSPECIFIED (ICD-599.70) OTHER CHANGE OF LACRIMAL PASSAGES (ICD-375.69) DIARRHEA (ICD-787.91) TOBACCO ABUSE (ICD-305.1) MITRAL VALVE PROLAPSE (ICD-424.0) HEART MURMUR, HX OF (ICD-V12.50) PALPITATIONS  (ICD-785.1) ROUTINE GYNECOLOGICAL EXAMINATION (ICD-V72.31) OTHER SCREENING MAMMOGRAM (ICD-V76.12) OTH&UNSPEC ENDOCRN NUTRIT METAB&IMMUNITY D/O (ICD-V77.99) SCREENING FOR LIPOID DISORDERS (ICD-V77.91) UNSPECIFIED CARDIAC DYSRHYTHMIA (ICD-427.9) CONTUSION, LEFT HAND (ICD-923.20) STONE, URINARY CALCULUS,UNSPEC. (ICD-592.9) HEMATURIA UNSPECIFIED (ICD-599.70) URI (ICD-465.9) TMJ PAIN (ICD-524.62) GASTRIC ULCER (ICD-531.90)   Current Meds ATENOLOL 50 MG TABS (ATENOLOL) Take one tablet by mouth daily IBUPROFEN 800 MG TABS (IBUPROFEN) Take 1 tab by mouth three times a day  as needed AMOXICILLIN 875 MG TABS (AMOXICILLIN) 1 by mouth two times a day for 8 days LEVAQUIN 750 MG TABS (LEVOFLOXACIN) 1 by mouth q day x 5 days TUSSIONEX PENNKINETIC ER 8-10 MG/5ML LQCR (CHLORPHENIRAMINE-HYDROCODONE) 1/2 to 1 tsp by mouth q 12 hrs as needed for cough PYRIDIUM 200 MG TABS (PHENAZOPYRIDINE HCL) 1 by mouth 3 xaday for 5 days  REVIEW OF SYSTEMS Constitutional Symptoms       Complains of fever and fatigue.     Denies chills, night sweats, weight loss, and weight gain.  Eyes       Denies change in vision, eye pain, eye discharge, glasses, contact lenses, and eye surgery. Ear/Nose/Throat/Mouth       Complains of frequent runny nose, sore throat, and hoarseness.      Denies hearing loss/aids, change in hearing, ear pain, ear discharge, dizziness, frequent nose bleeds, sinus problems, and tooth pain or bleeding.  Respiratory       Complains of dry cough.      Denies productive  cough, wheezing, shortness of breath, asthma, bronchitis, and emphysema/COPD.  Cardiovascular       Denies murmurs, chest pain, and tires easily with exhertion.    Gastrointestinal       Denies stomach pain, nausea/vomiting, diarrhea, constipation, blood in bowel movements, and indigestion. Genitourniary       Complains of painful urination and kidney stones.      Denies loss of urinary control. Neurological       Denies paralysis,  seizures, and fainting/blackouts. Musculoskeletal       Denies muscle pain, joint pain, joint stiffness, decreased range of motion, redness, swelling, muscle weakness, and gout.  Skin       Denies bruising, unusual mles/lumps or sores, and hair/skin or nail changes.  Psych       Denies mood changes, temper/anger issues, anxiety/stress, speech problems, depression, and sleep problems.  Past History:  Family History: Last updated: 05/28/2009 mother alive age 18 healthy father alive age 22 healthy sister alive age 72 healthy No premature CAD  Social History: Last updated: 05/28/2009 Works for [employer]. smoke 1 pack per day for 20 years drinks 2 drinks per week denies recreational drug use No exercise. Engaged to  [employer].  Has 2 grown kids.  Past Medical History: Reviewed history from 04/29/2010 and no changes required. STONE, URINARY CALCULUS,UNSPEC. (ICD-592.9) GASTRIC ULCER (ICD-531.90) cervical cancer Mitral valve prolapse  Past Surgical History: Reviewed history from 05/28/2009 and no changes required. Appendectomy Hysterectomy w/o oophorectomy for cancer 08 Tubal ligation Tonsillectomy  Family History: Reviewed history from 05/28/2009 and no changes required. mother alive age 24 healthy father alive age 51 healthy sister alive age 65 healthy No premature CAD  Social History: Reviewed history from 05/28/2009 and no changes required. Works for [employer]. smoke 1 pack per day for 20 years drinks 2 drinks per week denies recreational drug use No exercise. Engaged to  [employer].  Has 2 grown kids. Physical Exam General appearance: well developed, well nourished, moderatediscomfort Head: normocephalic, atraumatic Oral/Pharynx: pharyngeal erythema without exudate, uvula midline without deviation Neck: supple,anterior lymphadenopathy present Chest/Lungs: scattered rhonchi actively coughing Heart: regular rate and  rhythm, no murmur Extremities: normal  extremities Neurological: grossly intact and non-focal Skin: no obvious rashes or lesions MSE: oriented to time, place, and person Assessment Problems:   ACUTE PHARYNGITIS (ICD-462) UPPER RESPIRATORY INFECTION, ACUTE (ICD-465.9) CHEMICAL BURN (ICD-949.0) FOOT PAIN, LEFT (ICD-729.5) OVARIAN CYST (ICD-620.2) NEPHROLITHIASIS (ICD-592.0) HEMATURIA UNSPECIFIED (ICD-599.70) OTHER CHANGE OF LACRIMAL PASSAGES (ICD-375.69) DIARRHEA (ICD-787.91) TOBACCO ABUSE (ICD-305.1) MITRAL VALVE PROLAPSE (ICD-424.0) HEART MURMUR, HX OF (ICD-V12.50) PALPITATIONS (ICD-785.1) ROUTINE GYNECOLOGICAL EXAMINATION (ICD-V72.31) OTHER SCREENING MAMMOGRAM (ICD-V76.12) OTH&UNSPEC ENDOCRN NUTRIT METAB&IMMUNITY D/O (ICD-V77.99) SCREENING FOR LIPOID DISORDERS (ICD-V77.91) UNSPECIFIED CARDIAC DYSRHYTHMIA (ICD-427.9) CONTUSION, LEFT HAND (ICD-923.20) STONE, URINARY CALCULUS,UNSPEC. (ICD-592.9) HEMATURIA UNSPECIFIED (ICD-599.70) URI (ICD-465.9) TMJ PAIN (ICD-524.62) GASTRIC ULCER (ICD-531.90) New Problems: ABDOMINAL PAIN, ACUTE (ICD-789.00) HEMATURIA UNSPECIFIED (ICD-599.70) BRONCHITIS, ACUTE (ICD-466.0) UPPER RESPIRATORY INFECTION, ACUTE (ICD-465.9)   Plan New Medications/Changes: PYRIDIUM 200 MG TABS (PHENAZOPYRIDINE HCL) 1 by mouth 3 xaday for 5 days  #15 x 0, 02/04/2011, Hassan Rowan MD Sandria Senter ER 8-10 MG/5ML LQCR (CHLORPHENIRAMINE-HYDROCODONE) 1/2 to 1 tsp by mouth q 12 hrs as needed for cough  #4 fl oz x 0, 02/04/2011, Hassan Rowan MD LEVAQUIN 750 MG TABS (LEVOFLOXACIN) 1 by mouth q day x 5 days  #5 x 0, 02/04/2011, Hassan Rowan MD PYRIDIUM 200 MG TABS (PHENAZOPYRIDINE HCL) 1 by mouth 3 xaday for 5 days  #15 x 0, 02/04/2011, Hassan Rowan MD Stevphen Meuse  PENNKINETIC ER 8-10 MG/5ML LQCR (CHLORPHENIRAMINE-HYDROCODONE) 1/2 to 1 tsp by mouth q 12 hrs as needed for cough  #4 fl oz x 0, 02/04/2011, Hassan Rowan MD LEVAQUIN 750 MG TABS (LEVOFLOXACIN) 1 by mouth q day x 5 days  #5 x 0, 02/04/2011, Hassan Rowan  MD  New Orders: T-CT Abdomen/pelvis w/o [40981] T-Chest x-ray, 2 views [71020] UA Dipstick W/ Micro (manual) [81000] T-Culture, Urine [[id number]] Est. Patient Level IV [21308] Follow Up: Follow up in 2-3 days if no improvement, Follow up on an as needed basis, Follow up with Primary Physician Work/School Excuse: Return to work/school in 2 days  The patient and/or caregiver has been counseled thoroughly with regard to medications prescribed including dosage, schedule, interactions, rationale for use, and possible side effects and they verbalize understanding.  Diagnoses and expected course of recovery discussed and will return if not improved as expected or if the condition worsens. Patient and/or caregiver verbalized understanding.  Prescriptions: PYRIDIUM 200 MG TABS (PHENAZOPYRIDINE HCL) 1 by mouth 3 xaday for 5 days  #15 x 0   Entered and Authorized by:   Hassan Rowan MD   Signed by:   Hassan Rowan MD on 02/04/2011   Method used:   Printed then faxed to ...       [address] [phone]* (retail)       [address] [place], [place]  [address]       Ph: [phone]       Fax: [phone]   RxID:   [id number] Sandria Senter ER 8-10 MG/5ML LQCR (CHLORPHENIRAMINE-HYDROCODONE) 1/2 to 1 tsp by mouth q 12 hrs as needed for cough  #4 fl oz x 0   Entered and Authorized by:   Hassan Rowan MD   Signed by:   Hassan Rowan MD on 02/04/2011   Method used:   Printed then faxed to ...       [address] [phone]* (retail)       [address] [place], [place]  [address]       Ph: [phone]       Fax: [phone]   RxID:   [id number] LEVAQUIN 750 MG TABS (LEVOFLOXACIN) 1 by mouth q day x 5 days  #5 x 0   Entered and Authorized by:   Hassan Rowan MD   Signed by:   Hassan Rowan MD on 02/04/2011   Method used:   Printed then faxed to ...       [address] [phone]* (retail)       [address] [place], [place]  [address]       Ph: [phone]       Fax:  [phone]   RxID:   [id number] PYRIDIUM 200 MG TABS (PHENAZOPYRIDINE HCL) 1 by mouth 3 xaday for 5 days  #15 x 0   Entered and Authorized by:   Hassan Rowan MD   Signed by:   Hassan Rowan MD on 02/04/2011   Method used:   Printed then faxed to ...       [address] [phone]* (retail)       [address] [place], [place]  [address]       Ph: [phone]       Fax: [phone]   RxID:   [id number] Sandria Senter ER 8-10 MG/5ML LQCR (CHLORPHENIRAMINE-HYDROCODONE) 1/2  to 1 tsp by mouth q 12 hrs as needed for cough  #4 fl oz x 0   Entered and Authorized by:   Hassan Rowan MD   Signed by:   Hassan Rowan MD on 02/04/2011   Method used:   Printed then faxed to ...       [address] [phone]* (retail)       [address] [place], [place]  [address]       Ph: [phone]       Fax: [phone]   RxID:   [id number] LEVAQUIN 750 MG TABS (LEVOFLOXACIN) 1 by mouth q day x 5 days  #5 x 0   Entered and Authorized by:   Hassan Rowan MD   Signed by:   Hassan Rowan MD on 02/04/2011   Method used:   Printed then faxed to ...       [address] [phone]* (retail)       [address] [place], [place]  [address]       Ph: [phone]       Fax: [phone]   RxID:   [id number]   Patient Instructions: 1)  Please schedule a follow-up appointment as needed. 2)  Please schedule an appointment with your primary doctor in :3-10 days if not better 3)  Tobacco is very bad for your health and your loved ones! You Should stop smoking!. 4)  Stop Smoking Tips: Choose a Quit date. Cut down before the Quit date. decide what you will do as a substitute when you feel the urge to smoke(gum,toothpick,exercise). 5)  Get plenty of rest, drink lots of clear liquids, and use Tylenol or Ibuprofen for fever and comfort. Return in 7-10 days if you're not better:sooner if you're feeling worse. 6)  Take your antibiotic as prescribed until ALL of it is gone, but stop if you develop a  rash or swelling and contact our office as soon as possible. 7)  Acute bronchitis symptoms for less than 10 days are not helped by antibiotics. take over the counter cough medications. call if no improvment in  5-7 days, sooner if increasing cough, fever, or new symptoms( shortness of breath, chest pain). 8)  Recommended remaining out of work for   Orders Added: 1)  T-CT Abdomen/pelvis w/o [74176] 2)  T-Chest x-ray, 2 views [71020] 3)  UA Dipstick W/ Micro (manual) [81000] 4)  T-Culture, Urine [[id number]] 5)  Est. Patient Level IV [54270]    Laboratory Results   Urine Tests  Date/Time Received: February 04, 2011 10:46 AM  Date/Time Reported: February 04, 2011 10:46 AM   Routine Urinalysis   Color: yellow Appearance: Clear Glucose: negative   (Normal Range: Negative) Bilirubin: negative   (Normal Range: Negative) Ketone: negative   (Normal Range: Negative) Spec. Gravity: 1.015   (Normal Range: 1.003-1.035) Blood: 2+   (Normal Range: Negative) pH: 6.5   (Normal Range: 5.0-8.0) Protein: negative   (Normal Range: Negative) Urobilinogen: 0.2   (Normal Range: 0-1) Nitrite: negative   (Normal Range: Negative) Leukocyte Esterace: negative   (Normal Range: Negative)

## 2011-05-31 ENCOUNTER — Telehealth: Payer: Self-pay | Admitting: *Deleted

## 2011-05-31 ENCOUNTER — Encounter: Payer: Self-pay | Admitting: Family Medicine

## 2011-05-31 ENCOUNTER — Ambulatory Visit (INDEPENDENT_AMBULATORY_CARE_PROVIDER_SITE_OTHER): Payer: BC Managed Care – PPO | Admitting: Family Medicine

## 2011-05-31 VITALS — BP 92/54 | HR 58 | Wt 157.0 lb

## 2011-05-31 DIAGNOSIS — N83209 Unspecified ovarian cyst, unspecified side: Secondary | ICD-10-CM

## 2011-05-31 NOTE — Telephone Encounter (Signed)
Pt's husband called and states pt is in a lot of pain and has taken 3 percocet and it has not touched the pain.Advised pt that we dont rx'ed anything stronger than percocet but I would let dr know.

## 2011-05-31 NOTE — Telephone Encounter (Signed)
We don't do anything stronger than Percocet. If her pain is so severe then she may need IV pain medication. I would like t for her to be able to get in tomorrow with GYN, but if her pain is very severe she probably needs to go to the emergency department.

## 2011-05-31 NOTE — Progress Notes (Addendum)
Subjective:    Patient ID: [name], female    DOB: Jul 20, 1965, 45 y.o.   MRN: [id number]  HPI Lower abdominal pain for 1 weeks. + hx of kidney stones. Says would have to "push:"  To urinate. Woke up Friday AM w/ vomiting and 12/10 pain. Went to [facility] ED and told had an ovarian cyst, based on CT renal stone protocol and US pelvis.  Told to f/u with Dr. Mervyn Gay but they got the run around and couldn't get in. Says went back to the ED on Sunday (yesterday) and went to [facility] ED.  Today pain is 7/10. Was told would likely need surgical removal bc of size.  Needs referral to OB surg promptly. Given Percocet and diclofenac and hydroxyzine and phenergan for pain, given at the ED.     Review of Systems.  BP 92/54  Pulse 58  Wt 157 lb (71.215 kg)  SpO2 100%    No Known Allergies  Past Medical History  Diagnosis Date  . Urinary calculus or stone   . Gastric ulcer   . Cancer     cervical  . MVP (mitral valve prolapse)     Past Surgical History  Procedure Date  . Appendectomy   . Abdominal hysterectomy 2008    with oophorectomy for cancer  . Tubal ligation   . Tonsillectomy     History   Social History  . Marital Status: Married    Spouse Name: N/A    Number of Children: N/A  . Years of Education: N/A   Occupational History  . Not on file.   Social History Main Topics  . Smoking status: Current Everyday Smoker -- 1.0 packs/day for 20 years    Types: Cigarettes  . Smokeless tobacco: Not on file  . Alcohol Use: 1.0 oz/week    2 drink(s) per week     per week  . Drug Use: No  . Sexually Active: Not on file   Other Topics Concern  . Not on file   Social History Narrative  . No narrative on file    No family history on file.     Objective:   Physical Exam  Constitutional: She appears well-developed and well-nourished.  HENT:  Head: Normocephalic and atraumatic.  Cardiovascular: Normal rate and normal heart sounds.   Pulmonary/Chest: Effort normal  and breath sounds normal.  Abdominal: Soft. Bowel sounds are normal.       Very tender in the LLQ and some Radiation of pain to the LLQ with palpation on the RLQ.  She was tearful on exam. Mild tenderness in the RUQ an dLUQ.           Assessment & Plan:  Ovarian cyst - discussed her diagnosis. Based on the size of the lesion and the ED physician thought it was best for her have a cervical consult to see if it can be removed because of increased risk of torsion. Also her pain is very significant at this point in time. We will contact her with the GYN either later today or tomorrow. It is unclear whether or not she is pre-or postmenopausal and she has had a hysterectomy. Evidently they were unable to locate her other ovary on ultrasound. No family history of ovarian cancer which is reassuring. She said that a single Percocet does not seem to control her pain so I did encourage her to try taking 2 pills at one time and see if this relieves her pain better. Not give her any  more pain medications as she aren't he has prescriptions are given to her yesterday.  I reviewed her notes from the emergency room. They did visit was 05/28/2011. Ultrasound of the pelvis showed that the uterus and right ovary have been removed. The left adnexa showed a cystic mass measuring 5.6 x 3.8 cm. There were septations which makes this a complicated cyst. There is no definite left ovarian tissue although it could be compressed by the cyst. Consider this could represent a remnant cyst. Does anything with her pain level and the fact that her cyst is complicated it warrants referral to GYN for further evaluation and possible surgical removal.

## 2011-06-02 NOTE — Telephone Encounter (Signed)
Pt has been already been sched with GYN and was notified this

## 2011-07-12 ENCOUNTER — Other Ambulatory Visit: Payer: Self-pay | Admitting: *Deleted

## 2011-07-12 MED ORDER — SERTRALINE HCL 50 MG PO TABS: ORAL_TABLET | ORAL | Status: DC

## 2011-07-13 ENCOUNTER — Other Ambulatory Visit: Payer: Self-pay | Admitting: Cardiology

## 2011-09-08 ENCOUNTER — Emergency Department (INDEPENDENT_AMBULATORY_CARE_PROVIDER_SITE_OTHER)
Admission: EM | Admit: 2011-09-08 | Discharge: 2011-09-08 | Disposition: A | Discharge status: Home / Self Care | Payer: Worker's Compensation | Source: Home / Self Care | Attending: Family Medicine | Admitting: Family Medicine

## 2011-09-08 ENCOUNTER — Emergency Department
Admit: 2011-09-08 | Discharge: 2011-09-08 | Disposition: A | Discharge status: Home / Self Care | Payer: Worker's Compensation

## 2011-09-08 ENCOUNTER — Encounter: Payer: Self-pay | Admitting: *Deleted

## 2011-09-08 DIAGNOSIS — S60219A Contusion of unspecified wrist, initial encounter: Secondary | ICD-10-CM

## 2011-09-08 DIAGNOSIS — S60221A Contusion of right hand, initial encounter: Secondary | ICD-10-CM

## 2011-09-08 DIAGNOSIS — IMO0001 Reserved for inherently not codable concepts without codable children: Secondary | ICD-10-CM

## 2011-09-08 DIAGNOSIS — Z23 Encounter for immunization: Secondary | ICD-10-CM

## 2011-09-08 DIAGNOSIS — S60229A Contusion of unspecified hand, initial encounter: Secondary | ICD-10-CM

## 2011-09-08 DIAGNOSIS — S60211A Contusion of right wrist, initial encounter: Secondary | ICD-10-CM

## 2011-09-08 DIAGNOSIS — S6000XA Contusion of unspecified finger without damage to nail, initial encounter: Secondary | ICD-10-CM

## 2011-09-08 MED ORDER — KETOROLAC TROMETHAMINE 60 MG/2ML IM SOLN
60.0000 mg | Freq: Once | INTRAMUSCULAR | Status: AC
  Administered 2011-09-08: 60 mg via INTRAMUSCULAR

## 2011-09-08 MED ORDER — TETANUS-DIPHTH-ACELL PERTUSSIS 5-2.5-18.5 LF-MCG/0.5 IM SUSP
0.5000 mL | Freq: Once | INTRAMUSCULAR | Status: AC
  Administered 2011-09-08: 0.5 mL via INTRAMUSCULAR

## 2011-09-08 MED ORDER — NAPROXEN 500 MG PO TABS: 500.0000 mg | ORAL_TABLET | Freq: Two times a day (BID) | ORAL | Status: DC

## 2011-09-08 MED ORDER — HYDROCODONE-ACETAMINOPHEN 7.5-500 MG PO TABS: 1.0000 | ORAL_TABLET | Freq: Four times a day (QID) | ORAL | Status: DC | PRN

## 2011-09-08 NOTE — ED Notes (Signed)
Pt c/o RT hand injury x today @ work. "lifting well hatch& it fell on my fingers, fingers on right hand, 1st two index & middle fingers on right hand"--per pt

## 2011-09-08 NOTE — Discharge Instructions (Signed)
Apply ice pack for 30 to 45 minutes every 1 to 4 hours.  Continue until swelling decreases.  Wear ace wrap until swelling resolves.  Wear wrist splint for about 5 days. Apply Bacitracin and bandage to abrasions on fingers daily until healed.  Contusion A contusion is the result of an injury to the skin and underlying tissues and is usually caused by direct trauma. The injury results in the appearance of a bruise on the skin overlying the injured tissues. Contusions cause rupture and bleeding of the small capillaries and blood vessels and affect function, because the bleeding infiltrates muscles, tendons, nerves, or other soft tissues.  SYMPTOMS   Swelling and often a hard lump in the injured area, either superficial or deep.   Pain and tenderness over the area of the contusion.   Feeling of firmness when pressure is exerted over the contusion.   Discoloration under the skin, beginning with redness and progressing to the characteristic "black and blue" bruise.  CAUSES  A contusion is typically the result of direct trauma. This is often by a blunt object.  RISK INCREASES WITH:  Sports that have a high likelihood of trauma (football, boxing, ice hockey, soccer, field hockey, martial arts, basketball, and baseball).   Sports that make falling from a height likely (high-jumping, pole-vaulting, skating, or gymnastics).   Any bleeding disorder (hemophilia) or taking medications that affect clotting (aspirin, nonsteroidal anti-inflammatory medications, or warfarin [Coumadin]).   Inadequate protection of exposed areas during contact sports.  PREVENTION  Maintain physical fitness:   Joint and muscle flexibility.   Strength and endurance.   Coordination.   Wear proper protective equipment. Make sure it fits correctly.  PROGNOSIS  Contusions typically heal without any complications. Healing time varies with the severity of injury and intake of medications that affect clotting. Contusions  usually heal in 1 to 4 weeks. RELATED COMPLICATIONS   Damage to nearby nerves or blood vessels, causing numbness, coldness, or paleness.   Compartment syndrome.   Bleeding into the soft tissues that leads to disability.   Infiltrative-type bleeding, leading to the calcification and impaired function of the injured muscle (rare).   Prolonged healing time if usual activities are resumed too soon.   Infection if the skin over the injury site is broken.   Fracture of the bone underlying the contusion.   Stiffness in the joint where the injured muscle crosses.  TREATMENT  Treatment initially consists of resting the injured area as well as medication and ice to reduce inflammation. The use of a compression bandage may also be helpful in minimizing inflammation. As pain diminishes and movement is tolerated, the joint where the affected muscle crosses should be moved to prevent stiffness and the shortening (contracture) of the joint. Movement of the joint should begin as soon as possible. It is also important to work on maintaining strength within the affected muscles. Occasionally, extra padding over the area of contusion may be recommended before returning to sports, particularly if re-injury is likely.  MEDICATION   If pain relief is necessary these medications are often recommended:   Nonsteroidal anti-inflammatory medications, such as aspirin and ibuprofen.   Other minor pain relievers, such as acetaminophen, are often recommended.   Prescription pain relievers may be given by your caregiver. Use only as directed and only as much as you need.  HEAT AND COLD  Cold treatment (icing) relieves pain and reduces inflammation. Cold treatment should be applied for 10 to 15 minutes every 2 to 3 hours  for inflammation and pain and immediately after any activity that aggravates your symptoms. Use ice packs or an ice massage. (To do an ice massage fill a large styrofoam cup with water and freeze.  Tear a small amount of foam from the top so ice protrudes. Massage ice firmly over the injured area in a circle about the size of a softball.)   Heat treatment may be used prior to performing the stretching and strengthening activities prescribed by your caregiver, physical therapist, or athletic trainer. Use a heat pack or a warm soak.  SEEK MEDICAL CARE IF:   Symptoms get worse or do not improve despite treatment in a few days.   You have difficulty moving a joint.   Any extremity becomes extremely painful, numb, pale, or cool (This is an emergency!).   Medication produces any side effects (bleeding, upset stomach, or allergic reaction).   Signs of infection (drainage from skin, headache, muscle aches, dizziness, fever, or general ill feeling) occur if skin was broken.  Document Released: 05/31/2005 Document Revised: 05/20/2011 Document Reviewed: 09/12/2008 [facility] Patient Information 2012 [place], [place].

## 2011-09-08 NOTE — ED Provider Notes (Signed)
History     CSN: [id number]  Arrival date & time 09/08/11  1708   First MD Initiated Contact with Patient 09/08/11 1737      Chief Complaint  Patient presents with  . Hand Injury    Right hand- Worker's comp injury     HPI Comments: Patient reports that a heavy wood enclosure well hatch weighing about 100 pounds fell on her right fingers and hand today.  She complains of pain in fingers, hand, and wrist.  Patient is a 46 y.o. female presenting with hand injury. The history is provided by the patient.  Hand Injury  The incident occurred 1 to 2 hours ago. The incident occurred at work. The injury mechanism was a direct blow. The pain is present in the right hand, right fingers and right wrist. The quality of the pain is described as sharp and throbbing. The pain has been constant since the incident. She reports no foreign bodies present.    Past Medical History  Diagnosis Date  . Urinary calculus or stone   . Gastric ulcer   . Cancer     cervical  . MVP (mitral valve prolapse)     Past Surgical History  Procedure Date  . Appendectomy   . Abdominal hysterectomy 2008    with oophorectomy for cancer  . Tubal ligation   . Tonsillectomy     History reviewed. No pertinent family history.  History  Substance Use Topics  . Smoking status: Current Everyday Smoker -- 1.0 packs/day for 20 years    Types: Cigarettes  . Smokeless tobacco: Not on file  . Alcohol Use: 1.0 oz/week    2 drink(s) per week     per week    OB History    Grav Para Term Preterm Abortions TAB SAB Ect Mult Living                  Review of Systems  All other systems reviewed and are negative.    Allergies  Review of patient's allergies indicates no known allergies.  Home Medications   Current Outpatient Rx  Name Route Sig Dispense Refill  . ESTRADIOL 0.1 MG/24HR TD PTWK Transdermal Place 1 patch onto the skin once a week.    . ATENOLOL 50 MG PO TABS  TAKE ONE TABLET BY MOUTH EVERY DAY 90  tablet 1  . HYDROCODONE-ACETAMINOPHEN 7.5-500 MG PO TABS Oral Take 1 tablet by mouth every 6 (six) hours as needed for pain. 15 tablet 0  . IBUPROFEN 800 MG PO TABS Oral Take 800 mg by mouth every 8 (eight) hours as needed.      [name] NAPROXEN 500 MG PO TABS Oral Take 1 tablet (500 mg total) by mouth 2 (two) times daily. (every 12 hours with food) 14 tablet 1  . OXYCODONE-ACETAMINOPHEN 5-325 MG PO TABS  1-2 tab po q 6 hrs prn severe pain 40 tablet 0  . SERTRALINE HCL 50 MG PO TABS  75 mg. 1.5 tabs po daily      BP 105/65  Pulse 72  Temp(Src) 98.2 F (36.8 C) (Oral)  Resp 16  Ht 5\' 6"  (1.676 m)  Wt 156 lb (70.761 kg)  BMI 25.18 kg/m2  Physical Exam  Nursing note and vitals reviewed. Constitutional: She is oriented to person, place, and time. She appears well-developed and well-nourished. No distress.  Musculoskeletal:       Right wrist: She exhibits decreased range of motion, tenderness and bony tenderness. She exhibits no swelling,  no effusion, no crepitus, no deformity and no laceration.       Right hand: She exhibits tenderness, bony tenderness and swelling. She exhibits normal range of motion, normal two-point discrimination, normal capillary refill, no deformity and no laceration. normal sensation noted. Decreased strength noted. She exhibits thumb/finger opposition.       Hands:      Right hand reveals presence of a superficial 5mm long abrasion over dorsal PIP joint of 2nd and 3rd fingers.  No deformity.  2nd and 3rd fingers have decreased range of motion, but flexion/extension is intact.  There is tenderness over 2nd and 3rd fingers, and dorsum of the hand.   Right wrist reveals mild tenderness dorsally, extending proximally about 5cm over the distal radius and ulna.    Distal Neurovascular function is intact.   Neurological: She is alert and oriented to person, place, and time.    ED Course  Procedures  none  Labs Reviewed - No data to display Dg Wrist Complete  Right  09/08/2011  *RADIOLOGY REPORT*  Clinical Data: Right wrist injury.  RIGHT WRIST - COMPLETE 3+ VIEW  Comparison: None  Findings: The joint spaces are maintained.  No acute fracture.  IMPRESSION: No acute bony findings.  Original Report Authenticated By: P. Loralie Champagne, M.D.   Dg Hand Complete Right  09/08/2011  *RADIOLOGY REPORT*  Clinical Data: Right hand injury.  RIGHT HAND - COMPLETE 3+ VIEW  Comparison: None  Findings: The joint spaces are maintained.  No acute fracture.  IMPRESSION: No acute bony findings.  Original Report Authenticated By: P. Loralie Champagne, M.D.     1. Contusion of right hand   2. Contusion of second finger, right   3. Contusion of third finger of right hand   4. Contusion of right wrist       MDM  Abrasions on right 2nd and 3rd fingers cleaned with saline; applied Bacitracin and bandages. Tdap administered.  Ace wrap applied, followed by thumb spica splint Apply ice pack for 30 to 45 minutes every 1 to 4 hours.  Continue until swelling decreases.  Elevate.  Wear ace wrap until swelling resolves.  Wear wrist splint for about 5 days.  Begin range of motion exercises in about 5 days. Given Rx for Naproxen.  Lortab Q6hr prn, and taper to bedtime only. Apply Bacitracin and bandage to abrasions on fingers daily until healed.  Wound precautions discussed. Return for follow-up on 09/14/11.  [name]         Lattie Haw, MD 09/08/11 [phone]

## 2011-11-05 ENCOUNTER — Telehealth: Payer: Self-pay | Admitting: Cardiology

## 2011-11-05 NOTE — Telephone Encounter (Signed)
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

## 2011-11-09 ENCOUNTER — Encounter: Payer: Self-pay | Admitting: *Deleted

## 2011-11-09 ENCOUNTER — Telehealth: Payer: Self-pay | Admitting: Cardiology

## 2011-11-09 NOTE — Telephone Encounter (Signed)
Pt has a follow up appt. Generic letter made for her work. Note faxed to pts work @ [phone].

## 2011-11-09 NOTE — Telephone Encounter (Signed)
Left message for pt to call back  °

## 2011-11-09 NOTE — Telephone Encounter (Signed)
New msg Pt wanted to talk to you. She wouldn't tell my why. Please call

## 2011-11-09 NOTE — Telephone Encounter (Signed)
Left message for pt, she has not been seen since 2011. Will need office visit prior to getting a letter.

## 2011-11-10 ENCOUNTER — Ambulatory Visit (INDEPENDENT_AMBULATORY_CARE_PROVIDER_SITE_OTHER): Payer: Self-pay | Admitting: Physician Assistant

## 2011-11-10 ENCOUNTER — Encounter: Payer: Self-pay | Admitting: Physician Assistant

## 2011-11-10 DIAGNOSIS — R197 Diarrhea, unspecified: Secondary | ICD-10-CM

## 2011-11-10 DIAGNOSIS — K921 Melena: Secondary | ICD-10-CM

## 2011-11-10 DIAGNOSIS — R319 Hematuria, unspecified: Secondary | ICD-10-CM

## 2011-11-10 DIAGNOSIS — R112 Nausea with vomiting, unspecified: Secondary | ICD-10-CM

## 2011-11-10 LAB — POCT URINALYSIS DIPSTICK
Leukocytes, UA: NEGATIVE
Nitrite, UA: NEGATIVE
Protein, UA: NEGATIVE

## 2011-11-10 MED ORDER — HYDROCODONE-ACETAMINOPHEN 7.5-500 MG PO TABS: 1.0000 | ORAL_TABLET | Freq: Four times a day (QID) | ORAL | Status: AC | PRN

## 2011-11-10 MED ORDER — ONDANSETRON HCL 4 MG PO TABS: 4.0000 mg | ORAL_TABLET | Freq: Three times a day (TID) | ORAL | Status: AC | PRN

## 2011-11-10 NOTE — Patient Instructions (Addendum)
Will call with lab results of stool sample. Will send urine off for culture. Need to recheck urine in 2 weeks to make sure blood has resolved. Stay hydrated. Call if symptoms worsen or if show signs of dehydration. Sent Zofran to pharmacy as needed up to every 6 hours. Gave medication for pain control.  B.R.A.T. Diet Your doctor has recommended the B.R.A.T. diet for you or your child until the condition improves. This is often used to help control diarrhea and vomiting symptoms. If you or your child can tolerate clear liquids, you may have:  Bananas.   Rice.   Applesauce.   Toast (and other simple starches such as crackers, potatoes, noodles).  Be sure to avoid dairy products, meats, and fatty foods until symptoms are better. Fruit juices such as apple, grape, and prune juice can make diarrhea worse. Avoid these. Continue this diet for 2 days or as instructed by your caregiver. Document Released: 05/31/2005 Document Revised: 05/20/2011 Document Reviewed: 11/17/2006 [facility] Patient Information 2012 [place], [place].Dehydration, Adult Dehydration is when you lose more fluids from the body than you take in. Vital organs like the kidneys, brain, and heart cannot function without a proper amount of fluids and salt. Any loss of fluids from the body can cause dehydration.  CAUSES   Vomiting.   Diarrhea.   Excessive sweating.   Excessive urine output.   Fever.  SYMPTOMS  Mild dehydration  Thirst.   Dry lips.   Slightly dry mouth.  Moderate dehydration  Very dry mouth.   Sunken eyes.   Skin does not bounce back quickly when lightly pinched and released.   Dark urine and decreased urine production.   Decreased tear production.   Headache.  Severe dehydration  Very dry mouth.   Extreme thirst.   Rapid, weak pulse (more than 100 beats per minute at rest).   Cold hands and feet.   Not able to sweat in spite of heat and temperature.   Rapid breathing.   Blue lips.    Confusion and lethargy.   Difficulty being awakened.   Minimal urine production.   No tears.  DIAGNOSIS  Your caregiver will diagnose dehydration based on your symptoms and your exam. Blood and urine tests will help confirm the diagnosis. The diagnostic evaluation should also identify the cause of dehydration. TREATMENT  Treatment of mild or moderate dehydration can often be done at home by increasing the amount of fluids that you drink. It is best to drink small amounts of fluid more often. Drinking too much at one time can make vomiting worse. Refer to the home care instructions below. Severe dehydration needs to be treated at the hospital where you will probably be given intravenous (IV) fluids that contain water and electrolytes. HOME CARE INSTRUCTIONS   Ask your caregiver about specific rehydration instructions.   Drink enough fluids to keep your urine clear or pale yellow.   Drink small amounts frequently if you have nausea and vomiting.   Eat as you normally do.   Avoid:   Foods or drinks high in sugar.   Carbonated drinks.   Juice.   Extremely hot or cold fluids.   Drinks with caffeine.   Fatty, greasy foods.   Alcohol.   Tobacco.   Overeating.   Gelatin desserts.   Wash your hands well to avoid spreading bacteria and viruses.   Only take over-the-counter or prescription medicines for pain, discomfort, or fever as directed by your caregiver.   Ask your caregiver if you  should continue all prescribed and over-the-counter medicines.   Keep all follow-up appointments with your caregiver.  SEEK MEDICAL CARE IF:  You have abdominal pain and it increases or stays in one area (localizes).   You have a rash, stiff neck, or severe headache.   You are irritable, sleepy, or difficult to awaken.   You are weak, dizzy, or extremely thirsty.  SEEK IMMEDIATE MEDICAL CARE IF:   You are unable to keep fluids down or you get worse despite treatment.   You  have frequent episodes of vomiting or diarrhea.   You have blood or green matter (bile) in your vomit.   You have blood in your stool or your stool looks black and tarry.   You have not urinated in 6 to 8 hours, or you have only urinated a small amount of very dark urine.   You have a fever.   You faint.  MAKE SURE YOU:   Understand these instructions.   Will watch your condition.   Will get help right away if you are not doing well or get worse.  Document Released: 05/31/2005 Document Revised: 05/20/2011 Document Reviewed: 01/18/2011 [facility] Patient Information 2012 [place], [place].

## 2011-11-10 NOTE — Progress Notes (Signed)
  Subjective:    Patient ID: [name], female    DOB: 1965-11-24, 46 y.o.   MRN: [id number]  HPI For the past 2 days she has had watery diarrhea up to 12 times a day. Her abdominal pain is contstant and over the entire abdomen. She has been nauseated and vomiting. She has not had a fever, chills, SOB. No recent abx, travel. No one else is sick in family. She does smoke. She denies any painful urination. She does report blood in stool bright red. She has not tried anything to make better. She has tried to eat and thrown it up. She has been able to keep some fluid today. Abdominal pain is 9/10. She did have this once before and was a parasite. She works with well water. Once treated no more problems.    Review of Systems     Objective:   Physical Exam  Constitutional: She is oriented to person, place, and time. She appears well-developed and well-nourished.  HENT:  Head: Normocephalic and atraumatic.  Cardiovascular: Normal rate, regular rhythm and normal heart sounds.   Pulmonary/Chest: Effort normal and breath sounds normal.       No CVA tenderness.   Abdominal:       Tenderness throughout the whole abdomen. Jumped when I touched any area of her stomach. Normal bowel sounds.   Genitourinary: Guaiac negative stool.  Neurological: She is alert and oriented to person, place, and time.  Skin: Skin is warm and dry.  Psychiatric: She has a normal mood and affect. Her behavior is normal.          Assessment & Plan:  Nausea/Vomiting/Diarrhea/blood in stool/abdominal pain- UA + for large blood will send off for culture. REcheck urine in 2 weeks to make sure blood has resolved. Hemoccult negative for blood. Zofran give for nausea. Stool cultures ordered along with lab work. Will call when get results. Gave Loratab for pain control to use as needed but to every 8 hours. Gave handout for brat diet and told to stay hydrated.

## 2011-11-11 LAB — CBC WITH DIFFERENTIAL/PLATELET
Basophils Relative: 0 % (ref 0–1)
Eosinophils Absolute: 0.3 10*3/uL (ref 0.0–0.7)
Eosinophils Relative: 3 % (ref 0–5)
HCT: 40 % (ref 36.0–46.0)
Hemoglobin: 13.5 g/dL (ref 12.0–15.0)
Lymphs Abs: 2.5 10*3/uL (ref 0.7–4.0)
MCH: 32 pg (ref 26.0–34.0)
MCHC: 33.8 g/dL (ref 30.0–36.0)
MCV: 94.8 fL (ref 78.0–100.0)
Monocytes Absolute: 1.1 10*3/uL — ABNORMAL HIGH (ref 0.1–1.0)
Monocytes Relative: 10 % (ref 3–12)

## 2011-11-11 LAB — COMPLETE METABOLIC PANEL WITH GFR
ALT: 18 U/L (ref 0–35)
AST: 20 U/L (ref 0–37)
Alkaline Phosphatase: 59 U/L (ref 39–117)
CO2: 22 mEq/L (ref 19–32)
GFR, Est African American: >89 mL/min
Sodium: 138 mEq/L (ref 135–145)
Total Bilirubin: 0.5 mg/dL (ref 0.3–1.2)
Total Protein: 6.6 g/dL (ref 6.0–8.3)

## 2011-11-12 ENCOUNTER — Telehealth: Payer: Self-pay | Admitting: *Deleted

## 2011-11-12 NOTE — Telephone Encounter (Signed)
Left detailed message on pts phone

## 2011-11-12 NOTE — Telephone Encounter (Signed)
Can't give anything stronger than that. If she needs to go to ER for pain management she can. They will not get stool sample back any sooner than we will. If go to ER they will probably due CT scan if there might be anything else wrong beside what we suspect. If in that much pain that is my suggestion.

## 2011-11-12 NOTE — Telephone Encounter (Signed)
Pt called this am asking about results for the stool specimen. I informed pt that per lab it can take 2-3 days to get results. Pt has called back stating that she wanted to know about results and that she is in pain. Informed pt again that lab states it could be 2-3 days. Pt states that she is taking the Lortab but says it isn't helping her pain. Please advise.

## 2011-11-14 LAB — CULTURE, URINE COMPREHENSIVE

## 2011-11-15 ENCOUNTER — Other Ambulatory Visit: Payer: Self-pay | Admitting: Physician Assistant

## 2011-11-15 LAB — STOOL CULTURE

## 2011-11-15 MED ORDER — CIPROFLOXACIN HCL 500 MG PO TABS: 500.0000 mg | ORAL_TABLET | Freq: Two times a day (BID) | ORAL | Status: AC

## 2011-11-15 NOTE — Progress Notes (Signed)
Urine culture was positive for infection. Sent 7 days of abx.

## 2011-11-18 ENCOUNTER — Other Ambulatory Visit: Payer: Self-pay

## 2011-11-18 ENCOUNTER — Telehealth: Payer: Self-pay | Admitting: Family Medicine

## 2011-11-18 ENCOUNTER — Ambulatory Visit
Admission: RE | Admit: 2011-11-18 | Discharge: 2011-11-18 | Disposition: A | Discharge status: Home / Self Care | Payer: BC Managed Care – PPO | Source: Ambulatory Visit | Attending: Physician Assistant | Admitting: Physician Assistant

## 2011-11-18 ENCOUNTER — Telehealth: Payer: Self-pay | Admitting: *Deleted

## 2011-11-18 ENCOUNTER — Other Ambulatory Visit: Payer: Self-pay | Admitting: Physician Assistant

## 2011-11-18 DIAGNOSIS — Z87442 Personal history of urinary calculi: Secondary | ICD-10-CM

## 2011-11-18 DIAGNOSIS — R1084 Generalized abdominal pain: Secondary | ICD-10-CM

## 2011-11-18 DIAGNOSIS — K529 Noninfective gastroenteritis and colitis, unspecified: Secondary | ICD-10-CM

## 2011-11-18 DIAGNOSIS — R197 Diarrhea, unspecified: Secondary | ICD-10-CM

## 2011-11-18 MED ORDER — OXYCODONE-ACETAMINOPHEN 5-325 MG PO TABS: ORAL_TABLET | ORAL | Status: DC

## 2011-11-18 NOTE — Telephone Encounter (Signed)
Pt's husband informed of results of CT scan. Informed to come by and pick up pain med rx and that referral for GI has been placed. Husband will be by to pick up pain rx and states wife doesn't need anything for spasms. That she is not having cramps, just pain from ribs down to hip.

## 2011-11-18 NOTE — Telephone Encounter (Signed)
Call pt: CT is completley normal. Some cysts seen on one kidney but they have been there and are unchanged.  Will rx small quantity of percocet and will refer to GI for her diarrhea. Also consider bentyl for spasms.

## 2011-11-18 NOTE — Telephone Encounter (Signed)
Authorization obtained for CT Abdomen Pelvis. Auth # [id number]. Good for 30 days. KG LPN

## 2011-11-18 NOTE — Telephone Encounter (Signed)
Will schedule for CT of abdomen. Called patient and made aware. Will you write out of work for today and tomorrow.

## 2011-11-18 NOTE — Telephone Encounter (Signed)
Pt called this AM and is in extreme pain. She has been taking ABX but is now feeling worse. Please advise.

## 2011-11-29 ENCOUNTER — Encounter: Payer: Self-pay | Admitting: *Deleted

## 2011-12-01 ENCOUNTER — Encounter: Payer: Self-pay | Admitting: Family Medicine

## 2011-12-01 ENCOUNTER — Ambulatory Visit: Payer: Self-pay | Admitting: Cardiology

## 2011-12-01 DIAGNOSIS — K52832 Lymphocytic colitis: Secondary | ICD-10-CM | POA: Insufficient documentation

## 2011-12-13 ENCOUNTER — Telehealth: Payer: Self-pay | Admitting: Cardiology

## 2011-12-13 ENCOUNTER — Encounter: Payer: Self-pay | Admitting: *Deleted

## 2011-12-13 NOTE — Telephone Encounter (Signed)
New msg Pt is taking perphenazine 2 mg and she wanted to make sure ok to take.

## 2011-12-13 NOTE — Telephone Encounter (Signed)
Spoke with pt, discussed meds with sally putt pharm md, okay to take the med, it can cause long QT but the pt has an appt in 2 weeks and can be checked by EKG at that time. Pt aware of the appt and the importance of keeping that appt.

## 2011-12-22 ENCOUNTER — Encounter: Payer: Self-pay | Admitting: Cardiology

## 2011-12-22 ENCOUNTER — Ambulatory Visit (INDEPENDENT_AMBULATORY_CARE_PROVIDER_SITE_OTHER): Payer: BC Managed Care – PPO | Admitting: Cardiology

## 2011-12-22 VITALS — BP 100/68 | HR 69 | Ht 66.0 in | Wt 165.0 lb

## 2011-12-22 DIAGNOSIS — R002 Palpitations: Secondary | ICD-10-CM

## 2011-12-22 DIAGNOSIS — F17200 Nicotine dependence, unspecified, uncomplicated: Secondary | ICD-10-CM

## 2011-12-22 DIAGNOSIS — I059 Rheumatic mitral valve disease, unspecified: Secondary | ICD-10-CM

## 2011-12-22 NOTE — Assessment & Plan Note (Signed)
Continue atenolol. Palpitations are controlled.

## 2011-12-22 NOTE — Assessment & Plan Note (Signed)
Plan repeat echocardiogram. 

## 2011-12-22 NOTE — Assessment & Plan Note (Signed)
Patient counseled on discontinuing. She is in the process of doing this.

## 2011-12-22 NOTE — Patient Instructions (Addendum)
Your physician wants you to follow-up in: TWO YEARS WITH DR Jens Som IN [place] You will receive a reminder letter in the mail two months in advance. If you don't receive a letter, please call our office to schedule the follow-up appointment.   Your physician has requested that you have an echocardiogram. Echocardiography is a painless test that uses sound waves to create images of your heart. It provides your doctor with information about the size and shape of your heart and how well your heart's chambers and valves are working. This procedure takes approximately one hour. There are no restrictions for this procedure.

## 2011-12-22 NOTE — Progress Notes (Signed)
   HPI: Pleasant female for fu of palpitations and MVP. Echocardiogram in December of 2010 showed normal LV function, mild left ventricular hypertrophy, mild prolapse of the anterior mitral valve leaflet and mild mitral regurgitation. Since I last saw her in Nov 2011, the patient denies any dyspnea on exertion, orthopnea, PND, pedal edema, palpitations, syncope or chest pain.   Current Outpatient Prescriptions  Medication Sig Dispense Refill  . atenolol (TENORMIN) 50 MG tablet TAKE ONE TABLET BY MOUTH EVERY DAY  90 tablet  1  . budesonide (ENTOCORT EC) 3 MG 24 hr capsule Take 3 tablets by mouth Daily.      [name] estradiol (ESTRACE) 1 MG tablet Take 1 tablet by mouth Daily.      [name] ibuprofen (ADVIL,MOTRIN) 800 MG tablet Take 800 mg by mouth every 8 (eight) hours as needed.        [name] oxyCODONE-acetaminophen (PERCOCET) 5-325 MG per tablet 1-2 tab po q 6 hrs prn severe pain  40 tablet  0  . perphenazine (TRILAFON) 2 MG tablet Take 1 tablet by mouth Daily.      [name] perphenazine-amitriptyline (ETRAFON/TRIAVIL) 2-25 MG TABS Take 1 tablet by mouth Daily.      . sertraline (ZOLOFT) 50 MG tablet 75 mg. 1.5 tabs po daily      . DISCONTD: estradiol (CLIMARA - DOSED IN MG/24 HR) 0.1 mg/24hr Place 1 patch onto the skin once a week.         Past Medical History  Diagnosis Date  . Urinary calculus or stone   . Gastric ulcer   . Cancer     cervical  . MVP (mitral valve prolapse)     Past Surgical History  Procedure Date  . Appendectomy   . Abdominal hysterectomy 2008    with oophorectomy for cancer  . Tubal ligation   . Tonsillectomy     History   Social History  . Marital Status: Married    Spouse Name: N/A    Number of Children: N/A  . Years of Education: N/A   Occupational History  . Not on file.   Social History Main Topics  . Smoking status: Current Everyday Smoker -- 1.0 packs/day for 20 years    Types: Cigarettes  . Smokeless tobacco: Not on file  . Alcohol Use: 1.0 oz/week    2  drink(s) per week     per week  . Drug Use: No  . Sexually Active: Not on file   Other Topics Concern  . Not on file   Social History Narrative  . No narrative on file    ROS: no fevers or chills, productive cough, hemoptysis, dysphasia, odynophagia, melena, hematochezia, dysuria, hematuria, rash, seizure activity, orthopnea, PND, pedal edema, claudication. Remaining systems are negative.  Physical Exam: Well-developed well-nourished in no acute distress.  Skin is warm and dry.  HEENT is normal.  Neck is supple.  Chest is clear to auscultation with normal expansion.  Cardiovascular exam is regular rate and rhythm. Positive click Abdominal exam nontender or distended. No masses palpated. Extremities show no edema. neuro grossly intact  ECG sinus rhythm at a rate of 69. No ST changes.

## 2012-01-07 ENCOUNTER — Ambulatory Visit (HOSPITAL_COMMUNITY): Payer: BC Managed Care – PPO | Attending: Cardiology

## 2012-01-07 DIAGNOSIS — I059 Rheumatic mitral valve disease, unspecified: Secondary | ICD-10-CM | POA: Insufficient documentation

## 2012-01-07 DIAGNOSIS — I379 Nonrheumatic pulmonary valve disorder, unspecified: Secondary | ICD-10-CM | POA: Insufficient documentation

## 2012-01-07 DIAGNOSIS — I079 Rheumatic tricuspid valve disease, unspecified: Secondary | ICD-10-CM | POA: Insufficient documentation

## 2012-01-07 DIAGNOSIS — R002 Palpitations: Secondary | ICD-10-CM | POA: Insufficient documentation

## 2012-01-07 NOTE — Progress Notes (Signed)
Echocardiogram performed.  

## 2012-01-22 ENCOUNTER — Other Ambulatory Visit: Payer: Self-pay | Admitting: Family Medicine

## 2012-02-16 ENCOUNTER — Encounter: Payer: Self-pay | Admitting: Physician Assistant

## 2012-02-16 ENCOUNTER — Ambulatory Visit (INDEPENDENT_AMBULATORY_CARE_PROVIDER_SITE_OTHER): Payer: BC Managed Care – PPO

## 2012-02-16 ENCOUNTER — Ambulatory Visit (INDEPENDENT_AMBULATORY_CARE_PROVIDER_SITE_OTHER): Payer: BC Managed Care – PPO | Admitting: Physician Assistant

## 2012-02-16 VITALS — BP 115/76 | HR 63 | Temp 98.1°F | Ht 66.0 in | Wt 180.0 lb

## 2012-02-16 DIAGNOSIS — Z87442 Personal history of urinary calculi: Secondary | ICD-10-CM

## 2012-02-16 DIAGNOSIS — R300 Dysuria: Secondary | ICD-10-CM

## 2012-02-16 DIAGNOSIS — R1030 Lower abdominal pain, unspecified: Secondary | ICD-10-CM

## 2012-02-16 DIAGNOSIS — K52832 Lymphocytic colitis: Secondary | ICD-10-CM

## 2012-02-16 DIAGNOSIS — R233 Spontaneous ecchymoses: Secondary | ICD-10-CM

## 2012-02-16 DIAGNOSIS — R1032 Left lower quadrant pain: Secondary | ICD-10-CM

## 2012-02-16 DIAGNOSIS — R109 Unspecified abdominal pain: Secondary | ICD-10-CM

## 2012-02-16 LAB — POCT URINALYSIS DIPSTICK
Ketones, UA: NEGATIVE
Leukocytes, UA: NEGATIVE
Nitrite, UA: NEGATIVE
Protein, UA: NEGATIVE
pH, UA: 7

## 2012-02-16 MED ORDER — OXYCODONE-ACETAMINOPHEN 5-325 MG PO TABS: ORAL_TABLET | ORAL | Status: DC

## 2012-02-16 NOTE — Progress Notes (Signed)
  Subjective:    Patient ID: [name], female    DOB: 11-16-1965, 46 y.o.   MRN: [id number]  HPI Pt is a 46 yo female that presents to the clinic with CC of diarrhea and lower abdominal pain that started Friday. It continues to worsen. She does not have appendix/uterus/ovaries. She did have similar pain a couple months ago and went to GI to be diagnosed with lymphocytic colitis. She did have a CT that was completely normal a couple of months ago.She has had one exacerbation since then and had medication increased and did resolve.  Pain today is 9/10.  She has had some mucus in stool but denies blood.She did call Dr.weeks and he did not think this was flare and told her to go to ER. She did not want to go to ER so she called PCP. She denies any back pain, mild pain with urination. Has hx of kidney stones. She does not have any pain rx and has not tried anything else. She feels very swollen in her abdomen. She has not had any vomiting or nausea. She is out of pain medication.   She has noticed some red spots on her skin that concern her and her gums bleed when she brushes her teeth.   Review of Systems     Objective:   Physical Exam  Constitutional: She is oriented to person, place, and time. She appears well-developed and well-nourished.  HENT:  Head: Normocephalic and atraumatic.  Cardiovascular: Normal rate, regular rhythm and normal heart sounds.   Pulmonary/Chest: Effort normal and breath sounds normal. She has no wheezes.  Abdominal: Soft. Bowel sounds are normal.       Abdomin seemed slightly distended. Tenderness in left and right lower quadrants. NO rebound tenderness.  Neurological: She is alert and oriented to person, place, and time.  Skin:       Right arm red macule under skin in clusters.  Psychiatric: She has a normal mood and affect. Her behavior is normal.          Assessment & Plan:  Left lower abdominal pain/Dysuria/hx of kidney stones- UA trace blood. Will send  for culture. Could be kidney stone will evaluate with abd x-ray and call with results. I suspect that it is a flare of lymphocytic colitis. I recommend calling GI and follow up there. I want her to ask about ongoing Zoloft and since there has been some links could this be causing flares? I will give rx for pain.   [name]- I will check liver enzymes and CBC. REassured patient.

## 2012-02-16 NOTE — Patient Instructions (Addendum)
Gave percocet for pain.  Will send urine for culture. Get x-ray for kidney stones.  Will call with results. Stay hydrated call if worsening of symptoms changing.

## 2012-02-17 ENCOUNTER — Telehealth: Payer: Self-pay | Admitting: *Deleted

## 2012-02-17 LAB — CBC WITH DIFFERENTIAL/PLATELET
Basophils Absolute: 0 10*3/uL (ref 0.0–0.1)
Basophils Relative: 0 % (ref 0–1)
Eosinophils Absolute: 0.1 10*3/uL (ref 0.0–0.7)
Eosinophils Relative: 0 % (ref 0–5)
Lymphs Abs: 1.4 10*3/uL (ref 0.7–4.0)
MCH: 31.1 pg (ref 26.0–34.0)
MCHC: 32.8 g/dL (ref 30.0–36.0)
MCV: 94.6 fL (ref 78.0–100.0)
Neutrophils Relative %: 83 % — ABNORMAL HIGH (ref 43–77)
Platelets: 416 10*3/uL — ABNORMAL HIGH (ref 150–400)
RBC: 4.25 MIL/uL (ref 3.87–5.11)
RDW: 14.5 % (ref 11.5–15.5)

## 2012-02-17 LAB — AST: AST: 26 U/L (ref 0–37)

## 2012-02-17 NOTE — Telephone Encounter (Signed)
Work note given

## 2012-02-18 ENCOUNTER — Telehealth: Payer: Self-pay | Admitting: Physician Assistant

## 2012-02-18 NOTE — Telephone Encounter (Signed)
Pt called today and worried about leukemia. I do not see a typical presentation but will recheck CBC in 1 week.

## 2012-02-19 LAB — URINE CULTURE

## 2012-02-22 ENCOUNTER — Telehealth: Payer: Self-pay | Admitting: *Deleted

## 2012-02-22 DIAGNOSIS — R109 Unspecified abdominal pain: Secondary | ICD-10-CM

## 2012-02-22 NOTE — Telephone Encounter (Signed)
Labs entered for repeat CBC

## 2012-02-23 ENCOUNTER — Telehealth: Payer: Self-pay | Admitting: Physician Assistant

## 2012-02-23 ENCOUNTER — Other Ambulatory Visit: Payer: Self-pay | Admitting: Physician Assistant

## 2012-02-23 LAB — CBC WITH DIFFERENTIAL/PLATELET
Basophils Absolute: 0 10*3/uL (ref 0.0–0.1)
Eosinophils Absolute: 0.1 10*3/uL (ref 0.0–0.7)
Eosinophils Relative: 1 % (ref 0–5)
HCT: 38.6 % (ref 36.0–46.0)
Lymphocytes Relative: 17 % (ref 12–46)
MCH: 31.5 pg (ref 26.0–34.0)
MCHC: 32.9 g/dL (ref 30.0–36.0)
MCV: 95.8 fL (ref 78.0–100.0)
Monocytes Absolute: 0.7 10*3/uL (ref 0.1–1.0)
Platelets: 408 10*3/uL — ABNORMAL HIGH (ref 150–400)
RDW: 14.9 % (ref 11.5–15.5)
WBC: 10.4 10*3/uL (ref 4.0–10.5)

## 2012-02-23 NOTE — Telephone Encounter (Signed)
Let pt know that WBC has gone back in normal range. Platelets are slightly elevated but still trending down. I do not suspect leukemia as that was your worry. Next time you come in let's repeat again to make sure normal range.

## 2012-02-23 NOTE — Telephone Encounter (Signed)
Pt aware.

## 2012-03-04 LAB — CBC AND DIFFERENTIAL
HCT: 36 % (ref 36–46)
Platelets: 347 10*3/uL (ref 150–399)
WBC: 10.2 10^3/mL

## 2012-03-06 ENCOUNTER — Ambulatory Visit (INDEPENDENT_AMBULATORY_CARE_PROVIDER_SITE_OTHER): Payer: BC Managed Care – PPO | Admitting: Physician Assistant

## 2012-03-06 ENCOUNTER — Encounter: Payer: Self-pay | Admitting: Physician Assistant

## 2012-03-06 ENCOUNTER — Telehealth: Payer: Self-pay | Admitting: Physician Assistant

## 2012-03-06 VITALS — BP 120/72 | HR 60 | Ht 66.0 in | Wt 182.0 lb

## 2012-03-06 DIAGNOSIS — D649 Anemia, unspecified: Secondary | ICD-10-CM

## 2012-03-06 DIAGNOSIS — R233 Spontaneous ecchymoses: Secondary | ICD-10-CM

## 2012-03-06 DIAGNOSIS — K5289 Other specified noninfective gastroenteritis and colitis: Secondary | ICD-10-CM

## 2012-03-06 DIAGNOSIS — R5383 Other fatigue: Secondary | ICD-10-CM

## 2012-03-06 DIAGNOSIS — R238 Other skin changes: Secondary | ICD-10-CM

## 2012-03-06 DIAGNOSIS — K52832 Lymphocytic colitis: Secondary | ICD-10-CM

## 2012-03-06 DIAGNOSIS — R319 Hematuria, unspecified: Secondary | ICD-10-CM

## 2012-03-06 DIAGNOSIS — R5381 Other malaise: Secondary | ICD-10-CM

## 2012-03-06 NOTE — Telephone Encounter (Signed)
LM w/ triage nurse for Dr. Clarita Leber nurse to find out and call us back.

## 2012-03-06 NOTE — Patient Instructions (Addendum)
300mg  at night and 100mg  during the day. Call tomorrow and let me know if you feel like you can go.

## 2012-03-06 NOTE — Telephone Encounter (Signed)
Husband aware.

## 2012-03-06 NOTE — Telephone Encounter (Signed)
Nurse called back stating if Pt continues to have problems, she should call their office to schedule OV.

## 2012-03-06 NOTE — Telephone Encounter (Signed)
Ok per patient she was told that he did not need to see her again that she needed this test and that his office did not do it. Please relay msg and make patient aware. Maybe office will order test before seeing her again.

## 2012-03-06 NOTE — Telephone Encounter (Signed)
Call Dr. Heron Nay office at [facility]. Per patient they stated if blood in her urine persisted then another test should be ordered for further evaluation of hematuria/flank pain. Find out what test needs to be order. Thanks,

## 2012-03-06 NOTE — Progress Notes (Signed)
Subjective:    Patient ID: [name], female    DOB: 1966/01/27, 46 y.o.   MRN: [id number]  HPI Patient is a 46 year old female who presents to the clinic to followup after a emergency room visit for decreased mental status on 03/04/2012. She reports that she was told that she was anemic at the emergency room. She does not have a history of anemia. She arrived at the ER with decreased mental status and diagnosis was to be anemia, alcohol intoxication.   She has been recently started on a new medicine gabapentin. The night this occurred she had taken the gabapentin along with her Percocet and did have rum and cokes. Her husband found her in the bedroom with vomiting coming out of her mouth lying on the bed and unresponsive. Nothing like this has ever happened before. CT of the head was normal, CBC differential showed low RBCs and anemia, urine was positive for blood, glucose was not decrease or elevated on arrival or throughout the hospital visit, BUN/CR ratio was slightly elevated at 31.8, ethanol was positive and blood at 2:15, only oxycodone was found to Imitrex and, and CT of the chest was negative.  She does feel weak today and has felt this way practically since starting Neurontin. 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 Dr. Rexene Agent a gastroenterologist had put her on Neurontin and daily Advil along with another medication that she cannot remember. She does feel like the abdomen pains that have been ongoing for several weeks due to lymphocytic colitis are getting better. She does feel out of it when taking Neurontin. Her first dose he started her at 100 mg at night 100 mg during the day she had to decrease to 600 mg at night and 100 mg a day which was tolerable.   Persistent hematuria is an ongoing problem. She's been evaluated by [facility], Dr. Vicki Mallet. Per patient he said that and other tests need to be ordered to further evaluate.  The test was never ordered and patient wonders if they should be evaluated. Last urine culture was 914 and negative. Patient denies any worsening dysuria or urinary frequency. She does report that it is always more difficult for her to urinate. Review of Systems     Objective:   Physical Exam  Constitutional: She is oriented to person, place, and time. She appears well-developed and well-nourished.  HENT:  Head: Normocephalic and atraumatic.  Eyes: Conjunctivae normal are normal.  Cardiovascular: Normal rate, regular rhythm and normal heart sounds.   Pulmonary/Chest: Effort normal and breath sounds normal.  Neurological: She is alert and oriented to person, place, and time.  Skin:       Small petechia on right wrist. Bruises of her IV site in the antecubital space of left elbow and slight swelling over IV site in the left wrist. None of the IV sites fill warm or have any drainage coming from them. There are multiple bruises over bilateral arms and even bilateral legs.  Psychiatric: She has a normal mood and affect. Her behavior is normal.          Assessment & Plan:   anemia/easy bruising/knot of left wrist-fingerstick hemoglobin was 12.6. This is reassuring that there is no acute blood loss going on. I reassured the patient that bruising is likely coming from all the trauma she underwent with IV in the emergency room. Her liver enzymes are great. Her platelets were slightly elevated. I reassured  her that I thought taking ibuprofen  3 times a day slightly decreasing clotting factors and making her more easily to bleed. Patient encouraged to followup if she continues to bruise. Symptomatic care of ice and increasing her anti-inflammatories was given for the knot over an IV site on her left posterior wrist. It does not appear to be infected and patient was told if got red and hot and she started running fever to please call office. I did state she could start taking oral iron one to 2 times a  day to get her iron stores back. Follow up as needed  Lymphocytic colitis tiredness-patient is being managed by Dr. Katherine Roan and Neurontin seems to be helping patient with pain control. Since patient has been taking Neurontin she has felt very out of it. She is supposed to go back to work on Wednesday which is one day for now. She does not feel like she is coherent enough to drive a hazmat truck with chemicals. She has already had to decrease her Neurontin from 900 mg at night to 600 mg. That helped significantly with her consciousness. I suggested that she needs to decrease Neurontin again and try to get back to work. My suggestion was to call Dr. Katherine Roan and see if she could take 300 mg at night and 100 mg here and a day. Patient is aware that I want her to discuss with her gastroenterologist before making this change. If he agrees with the change she can have him or me write her back in to work with light duty. Patient instructed to call office if she felt like she could go to work or not her to work either way to get a written note for work. Patient was instructed to followup with Dr. Katherine Roan for further medication management if he is prescribing medication.  Persistent hematuria-we discussed that this is an ongoing problem and has not been thoroughly investigated at this point. I am going to call [facility] Dr. Simona Huh and we are going to see what he feels like the next test would be work procedure to be done to further evaluate this ongoing problem.     was spent with patient and greater than 50% of encounter was spent counseling over plan.

## 2012-03-07 ENCOUNTER — Telehealth: Payer: Self-pay | Admitting: *Deleted

## 2012-03-07 NOTE — Telephone Encounter (Signed)
On review of yesterday's note and blood work my initial reaction would be to encourage her to stop neurontin, however since I wasn't the prescribing physician I'm not sure what this was initially prescribed for and it should be discussed with the original provider.  However, I have seen Gabapentin/Neurontin cause significant confusion and fatigue.  I think it would be wise if she followed up with [facility] tomorrow if she's still feeling bad.  If she's feeling worse with respect to altered mental status, confusion, fever, shortness of breath, uncontrollable vomiting or any other major change in health status it would be wiser to be evaluated in an ED before then.

## 2012-03-07 NOTE — Telephone Encounter (Signed)
Pt has called stating that she is feeling worse today than yesterday. Pt seen [name] yesterday. Please advise.

## 2012-03-07 NOTE — Telephone Encounter (Signed)
Husband informed and states he will call back in am if pt not feeling better.

## 2012-03-08 ENCOUNTER — Telehealth: Payer: Self-pay | Admitting: *Deleted

## 2012-03-08 ENCOUNTER — Telehealth: Payer: Self-pay | Admitting: Physician Assistant

## 2012-03-08 NOTE — Telephone Encounter (Signed)
Letter printed and faxed

## 2012-03-08 NOTE — Telephone Encounter (Signed)
Pt called stating she stopped gabapentin completely bc she couldn't do anything besides sleep while on it. Pt returned to work today but would like letter to written & faxed [phone]) for light duty only x 1 week or so. Please advise.

## 2012-03-08 NOTE — Telephone Encounter (Signed)
Ok for letter for 1 week only. 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

## 2012-03-08 NOTE — Telephone Encounter (Signed)
Spoke with pt today. She has completely stopped gabapentin and still feels very drowsy and out of it. 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 She is to call back tomorrow to see how she feels after 48 hrs of no gabapentin.

## 2012-03-09 ENCOUNTER — Telehealth: Payer: Self-pay | Admitting: Physician Assistant

## 2012-03-09 NOTE — Telephone Encounter (Signed)
Patient called and lmom stating she was told by Lesly Rubenstein to call and update her or Marcelino Duster on how she is feeling. Patient states she is not any better and still feels really lethargic. Please called patient back and advise. Thanks, [employer]

## 2012-03-10 ENCOUNTER — Telehealth: Payer: Self-pay | Admitting: *Deleted

## 2012-03-10 NOTE — Telephone Encounter (Signed)
Pt aware.

## 2012-03-10 NOTE — Telephone Encounter (Signed)
Need to discuss with Gastroenterologist other medication options that help with pain but do not make you as sleepy as neuron tin does.

## 2012-03-10 NOTE — Telephone Encounter (Signed)
Med list updated

## 2012-03-13 ENCOUNTER — Encounter: Payer: Self-pay | Admitting: *Deleted

## 2012-03-14 ENCOUNTER — Telehealth: Payer: Self-pay | Admitting: *Deleted

## 2012-03-14 DIAGNOSIS — T148XXA Other injury of unspecified body region, initial encounter: Secondary | ICD-10-CM

## 2012-03-14 DIAGNOSIS — R5383 Other fatigue: Secondary | ICD-10-CM

## 2012-03-14 NOTE — Telephone Encounter (Signed)
Ok for b12, Vitamin D, ferritin, serum iron. Can go tomorrow.

## 2012-03-14 NOTE — Telephone Encounter (Signed)
Pt calls requesting to have her Vit B12 and ferritin levels checked due to fatigue, bruising and confusion. Please advise.

## 2012-03-15 LAB — IRON: Vitamin B12 Bind Capacity: 473

## 2012-03-21 ENCOUNTER — Telehealth: Payer: Self-pay | Admitting: *Deleted

## 2012-03-21 MED ORDER — ALPRAZOLAM 0.5 MG PO TABS: 0.5000 mg | ORAL_TABLET | Freq: Every evening | ORAL | Status: DC | PRN

## 2012-03-21 NOTE — Telephone Encounter (Signed)
Rx faxed

## 2012-03-21 NOTE — Telephone Encounter (Signed)
We can fax over rx for xanax.

## 2012-03-21 NOTE — Telephone Encounter (Signed)
Anxiety med needed for unexpected death of her mother. Pt going to [place] today to be w/ family and make arrangements for mother.

## 2012-03-21 NOTE — Telephone Encounter (Signed)
Husband calls and states that [name] lost her mother this morning and would like something for her nerves to get through. Husband states she is a basket case. Uses [facility] [address] Has used Valium in the past.  Med faxed to pharmacy per Marcelino Duster

## 2012-03-24 ENCOUNTER — Telehealth: Payer: Self-pay | Admitting: *Deleted

## 2012-03-24 MED ORDER — ALPRAZOLAM 1 MG PO TABS: 1.0000 mg | ORAL_TABLET | Freq: Two times a day (BID) | ORAL | Status: DC | PRN

## 2012-03-24 NOTE — Telephone Encounter (Signed)
Pt called requesting different med be called in for anxiety. Her mother died last week and she is having a very hard time w/ this. She is unable to sleep or eat. Pt was very tearful on the phone. Please advise.

## 2012-03-24 NOTE — Telephone Encounter (Signed)
Husband aware.

## 2012-03-24 NOTE — Telephone Encounter (Signed)
I will increase xanax to 1mg  and can take as needed up to twice a day for panic attacks and anxiety. This is only to use during this hard time. Will need to discuss other options if anxiety continues and needed Xanax like this on a regular basis.

## 2012-03-29 ENCOUNTER — Telehealth: Payer: Self-pay | Admitting: Physician Assistant

## 2012-03-29 NOTE — Telephone Encounter (Signed)
Pt's husband aware...

## 2012-03-29 NOTE — Telephone Encounter (Signed)
Call pt: vitamin d great! b12 great. Iron and ferritin normal but low normal. Oral iron twice a day could help give you some energy and get your stores back up. Start twice a day. Remember it can make you constipated so stool softners, water, and high fiber diet can help.

## 2012-03-30 ENCOUNTER — Encounter: Payer: Self-pay | Admitting: *Deleted

## 2012-04-14 ENCOUNTER — Telehealth: Payer: Self-pay | Admitting: *Deleted

## 2012-04-14 MED ORDER — PREGABALIN 75 MG PO CAPS: 75.0000 mg | ORAL_CAPSULE | Freq: Two times a day (BID) | ORAL | Status: DC

## 2012-04-14 NOTE — Telephone Encounter (Signed)
Husband calls stating Pt has seen pain management bc she was unable to tolerate gabapentin due to drowsiness. Pain management stopped gabapentin and started her phentanyl patches and clonazepam. Husband thinks patches are too addictive and too strong for Pt's type of pain. Husband wanted to know if there is anything that woks like the gabapentin but does not have SEs. I advised they call pain clinic but husband states they are not very helpful and do not return calls. Please advise.

## 2012-04-14 NOTE — Telephone Encounter (Signed)
Pt would like to try the Lyrica at whatever dose would be similar to the Neurontin. [facility] to [facility] [address]

## 2012-04-14 NOTE — Telephone Encounter (Signed)
She could try lyrica. It does have same side effect of sleepiness but she could tolerate it better. No generic available but insurance should cover since failed neurotin. I could send over script to try would you like that?

## 2012-04-14 NOTE — Telephone Encounter (Signed)
Follow up in 2 months

## 2012-04-15 ENCOUNTER — Emergency Department (HOSPITAL_BASED_OUTPATIENT_CLINIC_OR_DEPARTMENT_OTHER)
Admission: EM | Admit: 2012-04-15 | Discharge: 2012-04-15 | Disposition: A | Discharge status: Home / Self Care | Payer: BC Managed Care – PPO | Attending: Emergency Medicine | Admitting: Emergency Medicine

## 2012-04-15 ENCOUNTER — Encounter (HOSPITAL_BASED_OUTPATIENT_CLINIC_OR_DEPARTMENT_OTHER): Payer: Self-pay | Admitting: *Deleted

## 2012-04-15 ENCOUNTER — Emergency Department (HOSPITAL_BASED_OUTPATIENT_CLINIC_OR_DEPARTMENT_OTHER): Payer: BC Managed Care – PPO

## 2012-04-15 DIAGNOSIS — R1031 Right lower quadrant pain: Secondary | ICD-10-CM

## 2012-04-15 DIAGNOSIS — R1030 Lower abdominal pain, unspecified: Secondary | ICD-10-CM

## 2012-04-15 DIAGNOSIS — Z79899 Other long term (current) drug therapy: Secondary | ICD-10-CM | POA: Insufficient documentation

## 2012-04-15 DIAGNOSIS — K259 Gastric ulcer, unspecified as acute or chronic, without hemorrhage or perforation: Secondary | ICD-10-CM | POA: Insufficient documentation

## 2012-04-15 DIAGNOSIS — Z87442 Personal history of urinary calculi: Secondary | ICD-10-CM | POA: Insufficient documentation

## 2012-04-15 DIAGNOSIS — Z8541 Personal history of malignant neoplasm of cervix uteri: Secondary | ICD-10-CM | POA: Insufficient documentation

## 2012-04-15 DIAGNOSIS — I059 Rheumatic mitral valve disease, unspecified: Secondary | ICD-10-CM | POA: Insufficient documentation

## 2012-04-15 LAB — URINALYSIS, ROUTINE W REFLEX MICROSCOPIC
Glucose, UA: NEGATIVE mg/dL
Leukocytes, UA: NEGATIVE
Protein, ur: NEGATIVE mg/dL
Specific Gravity, Urine: 1.012 (ref 1.005–1.030)

## 2012-04-15 LAB — CBC
HCT: 39.6 % (ref 36.0–46.0)
MCH: 31.3 pg (ref 26.0–34.0)
MCHC: 33.1 g/dL (ref 30.0–36.0)
MCV: 94.7 fL (ref 78.0–100.0)
Platelets: 327 10*3/uL (ref 150–400)
RDW: 13.6 % (ref 11.5–15.5)
WBC: 9.7 10*3/uL (ref 4.0–10.5)

## 2012-04-15 LAB — URINE MICROSCOPIC-ADD ON

## 2012-04-15 LAB — COMPREHENSIVE METABOLIC PANEL
AST: 18 U/L (ref 0–37)
Albumin: 4 g/dL (ref 3.5–5.2)
BUN: 14 mg/dL (ref 6–23)
Calcium: 9.5 mg/dL (ref 8.4–10.5)
Chloride: 102 mEq/L (ref 96–112)
Creatinine, Ser: 0.8 mg/dL (ref 0.50–1.10)
Total Bilirubin: 0.4 mg/dL (ref 0.3–1.2)
Total Protein: 7.3 g/dL (ref 6.0–8.3)

## 2012-04-15 MED ORDER — KETOROLAC TROMETHAMINE 15 MG/ML IJ SOLN
15.0000 mg | Freq: Once | INTRAMUSCULAR | Status: AC
  Administered 2012-04-15: 15 mg via INTRAVENOUS
  Filled 2012-04-15: qty 1

## 2012-04-15 MED ORDER — HYDROMORPHONE HCL PF 1 MG/ML IJ SOLN
1.0000 mg | Freq: Once | INTRAMUSCULAR | Status: AC
  Administered 2012-04-15: 1 mg via INTRAVENOUS

## 2012-04-15 MED ORDER — OXYCODONE-ACETAMINOPHEN 5-325 MG PO TABS: 1.0000 | ORAL_TABLET | Freq: Four times a day (QID) | ORAL | Status: DC | PRN

## 2012-04-15 MED ORDER — LACTATED RINGERS IV BOLUS (SEPSIS)
1000.0000 mL | Freq: Once | INTRAVENOUS | Status: AC
  Administered 2012-04-15: 1000 mL via INTRAVENOUS

## 2012-04-15 MED ORDER — HYDROMORPHONE HCL PF 1 MG/ML IJ SOLN
1.0000 mg | Freq: Once | INTRAMUSCULAR | Status: AC
  Administered 2012-04-15: 1 mg via INTRAVENOUS
  Filled 2012-04-15: qty 1

## 2012-04-15 MED ORDER — HYDROMORPHONE HCL PF 1 MG/ML IJ SOLN
INTRAMUSCULAR | Status: AC
  Administered 2012-04-15: 1 mg via INTRAVENOUS
  Filled 2012-04-15: qty 1

## 2012-04-15 MED ORDER — ONDANSETRON HCL 4 MG/2ML IJ SOLN
4.0000 mg | Freq: Once | INTRAMUSCULAR | Status: AC
  Administered 2012-04-15: 4 mg via INTRAVENOUS
  Filled 2012-04-15: qty 2

## 2012-04-15 MED ORDER — IOHEXOL 300 MG/ML  SOLN
25.0000 mL | INTRAMUSCULAR | Status: AC
  Administered 2012-04-15 (×2): 25 mL via ORAL

## 2012-04-15 MED ORDER — IOHEXOL 300 MG/ML  SOLN
100.0000 mL | Freq: Once | INTRAMUSCULAR | Status: AC | PRN
  Administered 2012-04-15: 100 mL via INTRAVENOUS

## 2012-04-15 NOTE — ED Notes (Signed)
ED aware of vital signs and wants to continue with medication

## 2012-04-15 NOTE — ED Provider Notes (Signed)
History  This chart was scribed for Shelly Skene, MD by Marlin Canary and Shari Heritage. The patient was seen in room MH01/MH01. Patient's care was started at 1458.  CSN: [id number]  Arrival date & time 04/15/12  1414   First MD Initiated Contact with Patient 04/15/12 1458      Chief Complaint  Patient presents with  . Abdominal Pain    The history is provided by the patient. No language interpreter was used.    [name] is a 46 y.o.  with a history of colitis and ovarian cysts who presents to the Emergency Department complaining of sharp, constant, severe, worsening, deep lower abdominal pain that radiates from her abdomen to her back onset last night. Pain also in right inguinal region that "feels like there is a bump there."  Patient states that the pain started severe, but is gradually getting worse. Patient reports no relieving positions. Patient is also positive nausea and diarrhea (that is baseline due to colitis). Patient denies black or tarry stools, emesis, HA, fever, chills, rash, blurry vision, double vision, or SOB. Patient states that she had a 25mg  Fentanyl patch placed on Monday, but she took it off on Wednesday after it made her dizzy and disoriented. Patient states that she traveled to [place] 3 weeks ago. Patient was seen earlier today at [facility] and was referred to the ED for further evaluation.  Patient had an oophorectomy in December 2012 at [facility] after ovarian cysts began to leak. Patient also has a surgical history of appendectomy and total hysterectomy (5 years ago). She was diagnosed with cervical cancer 20 years ago and only she received "chemo injections" for treatment at that time. She also has a medical history of kidney stones.  History of chronic abdominal pain and IBS.  Gastroenterologist - Kyla Balzarine   Past Medical History  Diagnosis Date  . Urinary calculus or stone   . Gastric ulcer   . Cancer     cervical   . MVP (mitral valve prolapse)     Past Surgical History  Procedure Date  . Appendectomy   . Abdominal hysterectomy 2008    with oophorectomy for cancer  . Tubal ligation   . Tonsillectomy     History reviewed. No pertinent family history.  History  Substance Use Topics  . Smoking status: Current Every Day Smoker -- 1.0 packs/day for 20 years    Types: Cigarettes  . Smokeless tobacco: Not on file  . Alcohol Use: 1.0 oz/week    2 drink(s) per week     per week    OB History    Grav Para Term Preterm Abortions TAB SAB Ect Mult Living                  Review of Systems At least 10pt or greater review of systems completed and are negative except where specified in the HPI.  Allergies  Review of patient's allergies indicates no known allergies.  Home Medications   Current Outpatient Rx  Name Route Sig Dispense Refill  . ALPRAZOLAM 0.5 MG PO TABS Oral Take 1 tablet (0.5 mg total) by mouth at bedtime as needed for sleep. 30 tablet 0  . ALPRAZOLAM 1 MG PO TABS Oral Take 1 tablet (1 mg total) by mouth 2 (two) times daily as needed for anxiety. 30 tablet 0  . ATENOLOL 25 MG PO TABS Oral Take 25 mg by mouth daily.    . BUDESONIDE ER 3 MG PO  CP24 Oral Take 3 tablets by mouth Daily.    [name] ESTRADIOL 1 MG PO TABS Oral Take 1 tablet by mouth Daily.    [name] GABAPENTIN 100 MG PO CAPS Oral Take 100 mg by mouth 3 (three) times daily.    [name] GABAPENTIN 600 MG PO TABS Oral Take 600 mg by mouth at bedtime.    . IBUPROFEN 800 MG PO TABS Oral Take 800 mg by mouth every 8 (eight) hours as needed.      . OXYCODONE-ACETAMINOPHEN 5-325 MG PO TABS  1-2 tab po q 6 hrs prn severe pain 40 tablet 0  . PERPHENAZINE-AMITRIPTYLINE 2-25 MG PO TABS Oral Take 1 tablet by mouth Daily.    [name] PREGABALIN 75 MG PO CAPS Oral Take 1 capsule (75 mg total) by mouth 2 (two) times daily. 60 capsule 1  . SERTRALINE HCL 50 MG PO TABS  TAKE ONE AND ONE-HALF TABLETS DAILY 135 tablet 0    BP 114/63  Pulse 50  Temp 97.8 F  (36.6 C)  Resp 22  Ht 5\' 6"  (1.676 m)  Wt 178 lb (80.74 kg)  BMI 28.73 kg/m2  SpO2 100%  Physical Exam Nursing notes reviewed.  Electronic medical record reviewed. VITAL SIGNS:   Filed Vitals:   04/15/12 1422  BP: 114/63  Pulse: 50  Temp: 97.8 F (36.6 C)  Resp: 22  Height: 5\' 6"  (1.676 m)  Weight: 178 lb (80.74 kg)  SpO2: 100%   CONSTITUTIONAL: Awake, oriented, appears non-toxic HENT: Atraumatic, normocephalic, oral mucosa pink and moist, airway patent. Nares patent without drainage. External ears normal. EYES: Conjunctiva clear, EOMI, PERRLA NECK: Trachea midline, non-tender, supple CARDIOVASCULAR: Normal heart rate, Normal rhythm, No murmurs, rubs, gallops PULMONARY/CHEST: Clear to auscultation, no rhonchi, wheezes, or rales. Symmetrical breath sounds. Non-tender. ABDOMINAL: Non-distended, soft, tender to palpation in lower abdomen R>L,  - no rebound or guarding.  BS normal.  Worse TTP in the inguinal canal.   No femoral/direct or indirect hernia appreciated.  Tenderness to palpation of right and left inguinal ligaments R>L. No masses or LAD. No erythema or skin abnormalities. NEUROLOGIC: Non-focal, moving all four extremities, no gross sensory or motor deficits. EXTREMITIES: No clubbing, cyanosis, or edema SKIN: Warm, Dry, No erythema, No rash    ED Course  [place] bedside Performed by: [name] Authorized by: [name] Consent: Verbal consent obtained. Comments: Veins of proximal thigh were evaluated showing good compressibility, no clots and good arterial flow.  No areas of hypoechoic fluid collection were seen.   (including critical care time) DIAGNOSTIC STUDIES: Oxygen Saturation is 100% on room air. normal by my interpretation.    COORDINATION OF CARE: 3:40pm- Patient informed of current plan for treatment and evaluation and agrees with plan at this time.   Results for orders placed during the hospital encounter of 04/15/12  URINALYSIS, ROUTINE W  REFLEX MICROSCOPIC      Component Value Range   Color, Urine YELLOW  YELLOW   APPearance CLEAR  CLEAR   Specific Gravity, Urine 1.012  1.005 - 1.030   pH 6.0  5.0 - 8.0   Glucose, UA NEGATIVE  NEGATIVE mg/dL   Hgb urine dipstick MODERATE (*) NEGATIVE   Bilirubin Urine NEGATIVE  NEGATIVE   Ketones, ur NEGATIVE  NEGATIVE mg/dL   Protein, ur NEGATIVE  NEGATIVE mg/dL   Urobilinogen, UA 0.2  0.0 - 1.0 mg/dL   Nitrite NEGATIVE  NEGATIVE   Leukocytes, UA NEGATIVE  NEGATIVE  CBC  Component Value Range   WBC 9.7  4.0 - 10.5 K/uL   RBC 4.18  3.87 - 5.11 MIL/uL   Hemoglobin 13.1  12.0 - 15.0 g/dL   HCT 40.9  81.1 - 91.4 %   MCV 94.7  78.0 - 100.0 fL   MCH 31.3  26.0 - 34.0 pg   MCHC 33.1  30.0 - 36.0 g/dL   RDW 78.2  95.6 - 21.3 %   Platelets 327  150 - 400 K/uL  COMPREHENSIVE METABOLIC PANEL      Component Value Range   Sodium 139  135 - 145 mEq/L   Potassium 4.3  3.5 - 5.1 mEq/L   Chloride 102  96 - 112 mEq/L   CO2 25  19 - 32 mEq/L   Glucose, Bld 87  70 - 99 mg/dL   BUN 14  6 - 23 mg/dL   Creatinine, Ser 0.86  0.50 - 1.10 mg/dL   Calcium 9.5  8.4 - 57.8 mg/dL   Total Protein 7.3  6.0 - 8.3 g/dL   Albumin 4.0  3.5 - 5.2 g/dL   AST 18  0 - 37 U/L   ALT 17  0 - 35 U/L   Alkaline Phosphatase 76  39 - 117 U/L   Total Bilirubin 0.4  0.3 - 1.2 mg/dL   GFR calc non Af Amer 86 (*) >90 mL/min   GFR calc Af Amer >90  >90 mL/min  URINE MICROSCOPIC-ADD ON      Component Value Range   Squamous Epithelial / LPF MANY (*) RARE   WBC, UA 0-2  <3 WBC/hpf   RBC / HPF 7-10  <3 RBC/hpf   Bacteria, UA FEW (*) RARE     Ct Abdomen Pelvis W Contrast  04/15/2012  *RADIOLOGY REPORT*  Clinical Data: Abdominal pain.  CT ABDOMEN AND PELVIS WITH CONTRAST  Technique:  Multidetector CT imaging of the abdomen and pelvis was performed following the standard protocol during bolus administration of intravenous contrast.  Contrast: OMNIPAQUE IOHEXOL 300 MG/ML  SOLN  Comparison: CT of the abdomen and  pelvis 11/18/2011.  Findings:  Lung Bases: Minimal dependent atelectasis in the lower lobes of the lungs bilaterally.  Abdomen/Pelvis:  The enhanced appearance of the liver, gallbladder, pancreas, spleen, bilateral adrenal glands in the right kidney is unremarkable.  A 3.8 cm simple cyst in the interpolar region of the left kidney is unchanged.  Small low attenuation lesions in the lower pole of the left kidney are similar to prior examinations, but are too small to definitively characterize on today's study.  No ascites or pneumoperitoneum and no pathologic distension of small bowel.  Status post appendectomy.  No definite pathologic lymphadenopathy identified within the abdomen or pelvis.  Status post hysterectomy.  Ovaries are not confidently identified and are likely surgically removed.  Musculoskeletal: There are no aggressive appearing lytic or blastic lesions noted in the visualized portions of the skeleton.  IMPRESSION: 1.  No acute findings in the abdomen or pelvis to account for the patient's symptoms. 2.  3.8 cm simple cyst in the interpolar region of the left kidney is unchanged, as are the sub-centimeter low attenuation lesions in the lower pole of the left kidney which is too small to definitively characterize. 3.  Postoperative changes, as above.   Original Report Authenticated By: Trudie Reed, M.D.      1. Right inguinal pain   2. Lower abdominal pain       MDM  Selena Swaminathan is  a 46 y.o. female with a h/o chronic abdominal pain presents with atypical variant of her typical pain - typically her pain is left sided, now it's on the right and includes the right inguinal region.  Denies vaginal DC/UTI symptoms.  H/o stones, diverticulosis.  S/P appendectomy and total hysterectomy.  Given her past surgical history, differential diagnosis of her lower abdominal pain include: urinary calculi, diverticulitis, cystitis, SBO/LBO, viscus perforation, hernias to include femoral/indirect and  direct. Pt also has a h/o depression and recently had death in the family.  Pt quickly dismissed this notion of a psychosomatic complaint (proposed by husband) but has also been changing around her pain medications.  She and her husband are clearly frustrated by recurrent pain, presentations to the ER "you guys never find anything".  Pt recently DC'd her fentanyl patches and was switched from gabapentin to lyrica.  Workup showed microscopic hematuria - she has also been worked up previously w/cystoscopy which did not c/w interstitial cystitis. CT showed no stones, a persistent unchanged renal cysts, but was unremarkable.  Re-examination of abdomen was unchanged.  She had focal TTP in the LLQ and RLQ also in the Right inguinal area.  I don't think this could be mesenteric ischemia based on her age, the focal nature and no relationship to eating.  Also, no hernias seen on CT. I performed a bedside ultrasound to evaluate deep veins of the inguinal regional - all were patent and compressible.  Good arterial flow.  She had tenderness over the inguinal sheath - pain does respond to gabapentin evidently, raises question of a nerve entrapment syndrome.    Pt will be given some pain medicine and advised to f/u with PCP and pain management. No emergent conditions found at this time.    I explained the diagnosis and have given explicit precautions to return to the ER including any other new or worsening symptoms. The patient understands and accepts the medical plan as it's been dictated and I have answered their questions. Discharge instructions concerning home care and prescriptions have been given.  The patient is STABLE and is discharged to home in good condition.    I personally performed the services described in this documentation, which was scribed in my presence. The recorded information has been reviewed and considered. [name], M.D.     Shelly Skene, MD 04/17/12 1212

## 2012-04-15 NOTE — ED Notes (Signed)
Pt presents to ED today with continous abd pain that started late last night.  Pt has hx of coloitis.  Pt states radiates around to back and no UTI sx

## 2012-04-17 ENCOUNTER — Ambulatory Visit (INDEPENDENT_AMBULATORY_CARE_PROVIDER_SITE_OTHER): Payer: BC Managed Care – PPO | Admitting: Physician Assistant

## 2012-04-17 ENCOUNTER — Encounter: Payer: Self-pay | Admitting: Physician Assistant

## 2012-04-17 VITALS — BP 110/62 | Wt 180.0 lb

## 2012-04-17 DIAGNOSIS — IMO0002 Reserved for concepts with insufficient information to code with codable children: Secondary | ICD-10-CM

## 2012-04-17 DIAGNOSIS — R109 Unspecified abdominal pain: Secondary | ICD-10-CM

## 2012-04-17 DIAGNOSIS — R1031 Right lower quadrant pain: Secondary | ICD-10-CM

## 2012-04-17 DIAGNOSIS — M792 Neuralgia and neuritis, unspecified: Secondary | ICD-10-CM

## 2012-04-17 DIAGNOSIS — R1032 Left lower quadrant pain: Secondary | ICD-10-CM

## 2012-04-17 NOTE — Progress Notes (Signed)
Subjective:    Patient ID: [name], female    DOB: 06/28/1965, 46 y.o.   MRN: [id number]  HPI Patient is a pleasant 46 yo female who presents to the clinic to follow up from ER 2 days ago. Patient has had lower right and left quadrant abdominal pain since June. In June she I did diagnose her with lymphocytic colitis. The colitis improved with medication and she felt great for 6 weeks. She then started having abdominal pain again and he continued to worsen. GI has done workup and cannot find any other causes of abdominal pain. She has had multiple CT scans of the abdomen with the most recent being 2 days ago. CT scan was negative for any acute findings there was a cyst on her left kidney but seems to be stable and does not coincide with pain. also had ultrasound of her right lower larger in which was unremarkable. Patient has a history of blood in the urine which has been worked up and negative. The only thing that has helped at all has been neurotontin but it made her drunk feeling. I gave on Friday Lyrica and she has started it but not really noticed any difference. She has seen pain clinic and they gave her fentanly patches which she does not think work. The pain is 10/10 per paitent and wakes her up at night. All movement makes worse. Denies any trauma to the area or back. She does have some lower back discomfort but would not call it pain.   Hx of ovarian cyst, nephrolithiasis. Both of which has been ruled out.       Review of Systems     Objective:   Physical Exam  Constitutional: She is oriented to person, place, and time. She appears well-developed and well-nourished.  HENT:  Head: Normocephalic and atraumatic.  Cardiovascular: Normal rate, regular rhythm, normal heart sounds and intact distal pulses.   Pulmonary/Chest: Effort normal and breath sounds normal.  Abdominal: Soft.       Very guarded and tenderness to light and deep palpation over right hip and along right lower  quadrant and across to the left lower quadrant and around umbilicus.   Musculoskeletal:       Not able to have full ROM at waist due to pain in lower quadrants. No tenderness over spine or Paraspinous muscles.   Able to palpate around hip bone what feels like lymph nodes. (ultrasound normal)  Neurological: She is alert and oriented to person, place, and time.  Skin:       No warmth or any skin changes over right or left lower quadrant.   Psychiatric:       Very tearful and frustrated.           Assessment & Plan:  Right and Left lower abdominal pain- due to untypical pain. I am going to get MRI of thoracic spine to look for any spinal cord causes of referred pain on dermatome. Per patients request she want MRI of abdomen. Since all CT's have been negative I don't know if this will detect anything. I feel like this might be some nerve entrapment. I do not think local injections will helps because of the large surface area. If anything epidural injections may help. I would like to get imaging and neurology referral before we resort to epidural injections. Because of this vague pain I am going to test for lyme's disease. Will call with results. To help with pain encourage patient to start back  on fentanyl patches, use lidoderm patches on painful area, continue lyrica and increase to 150mg  twice a day on Friday. Will order MRI and referral STAT.

## 2012-04-17 NOTE — Patient Instructions (Addendum)
When been on 1 week do 2 75mg  in the morning and 2 75 at night of lyrica.   MRI will be called. I will refer to neurology STAT.

## 2012-04-18 ENCOUNTER — Telehealth: Payer: Self-pay | Admitting: *Deleted

## 2012-04-18 LAB — B. BURGDORFI ANTIBODIES: B burgdorferi Ab IgG+IgM: 0.32 {ISR}

## 2012-04-18 NOTE — Telephone Encounter (Signed)
Husband called and would like to know about a test for [facility] Fever test. I called patient's husband back at [phone], only to leave a message to return call.

## 2012-04-18 NOTE — Telephone Encounter (Signed)
[name]'s husband, Kathlene November wants a [facility] Spotted Fever titer added to labs.   Positive symptoms are Abdominal pain, muscle weakness in legs, cold chills, nausea  Negative symptoms vomiting, fever  He does not believe she is in an emergent or urgent state.  He is also concerned about lead and mercury poisoning.

## 2012-04-18 NOTE — Telephone Encounter (Signed)
Pt husband calls with some concerns of what could be causing wifes problems and would like very much to speak with you befiore ordering the MRI. Please call him at work phone.

## 2012-04-19 ENCOUNTER — Other Ambulatory Visit: Payer: Self-pay | Admitting: Physician Assistant

## 2012-04-19 DIAGNOSIS — R1031 Right lower quadrant pain: Secondary | ICD-10-CM

## 2012-04-19 DIAGNOSIS — R1032 Left lower quadrant pain: Secondary | ICD-10-CM

## 2012-04-19 DIAGNOSIS — R109 Unspecified abdominal pain: Secondary | ICD-10-CM

## 2012-04-19 DIAGNOSIS — M792 Neuralgia and neuritis, unspecified: Secondary | ICD-10-CM

## 2012-04-19 DIAGNOSIS — IMO0001 Reserved for inherently not codable concepts without codable children: Secondary | ICD-10-CM

## 2012-04-19 DIAGNOSIS — M2550 Pain in unspecified joint: Secondary | ICD-10-CM

## 2012-04-19 NOTE — Telephone Encounter (Signed)
Go ahead and get MRI. Pt agrees.

## 2012-04-20 ENCOUNTER — Telehealth: Payer: Self-pay | Admitting: *Deleted

## 2012-04-20 DIAGNOSIS — B2790 Infectious mononucleosis, unspecified without complication: Secondary | ICD-10-CM

## 2012-04-20 LAB — CK: Total CK: 46 U/L (ref 7–177)

## 2012-04-20 LAB — SEDIMENTATION RATE: Sed Rate: 1 mm/hr (ref 0–22)

## 2012-04-20 LAB — ANA: Anti Nuclear Antibody(ANA): NEGATIVE

## 2012-04-20 LAB — EPSTEIN-BARR VIRUS EARLY D ANTIGEN ANTIBODY, IGG: EBV EA IgG: 127 U/mL — ABNORMAL HIGH (ref <9.0)

## 2012-04-20 NOTE — Telephone Encounter (Signed)
Husband calls as FYI stating Pt now has unexplained bruising on foot and butt. Pt also states that legs are slightly swollen today as well.

## 2012-04-20 NOTE — Telephone Encounter (Signed)
Authorization # for MR of T- Spine and Mr Abdomen with/without contrast per [employer] is [id number] good for 60 days

## 2012-04-20 NOTE — Telephone Encounter (Signed)
[name] at [facility] called and states that the MRI both need to be with and without contrast and needs orders changed. Can you do that and then I will work on pre-certing them both. Thanks

## 2012-04-21 DIAGNOSIS — B2790 Infectious mononucleosis, unspecified without complication: Secondary | ICD-10-CM | POA: Insufficient documentation

## 2012-04-21 LAB — HEAVY METALS, BLOOD: Mercury, B: <4 mcg/L (ref <=10)

## 2012-04-21 MED ORDER — ACYCLOVIR 400 MG PO TABS: 400.0000 mg | ORAL_TABLET | Freq: Three times a day (TID) | ORAL | Status: DC

## 2012-04-21 NOTE — Telephone Encounter (Signed)
Pt informed

## 2012-04-21 NOTE — Telephone Encounter (Signed)
Signed in system.

## 2012-04-21 NOTE — Telephone Encounter (Signed)
Call pt: sent antiviral. Referred to infectious disease.

## 2012-04-21 NOTE — Telephone Encounter (Signed)
Husband called again yest afternoon stating Pt's mother had pancreatitis and wanted to share that. Pt also reports more unexplained bruising on arms.

## 2012-04-22 ENCOUNTER — Ambulatory Visit (HOSPITAL_BASED_OUTPATIENT_CLINIC_OR_DEPARTMENT_OTHER)
Admission: RE | Admit: 2012-04-22 | Discharge: 2012-04-22 | Disposition: A | Discharge status: Home / Self Care | Payer: BC Managed Care – PPO | Source: Ambulatory Visit | Attending: Physician Assistant | Admitting: Physician Assistant

## 2012-04-22 DIAGNOSIS — R079 Chest pain, unspecified: Secondary | ICD-10-CM | POA: Insufficient documentation

## 2012-04-22 DIAGNOSIS — R109 Unspecified abdominal pain: Secondary | ICD-10-CM

## 2012-04-22 DIAGNOSIS — M792 Neuralgia and neuritis, unspecified: Secondary | ICD-10-CM

## 2012-04-22 DIAGNOSIS — M5020 Other cervical disc displacement, unspecified cervical region: Secondary | ICD-10-CM | POA: Insufficient documentation

## 2012-04-22 DIAGNOSIS — R1031 Right lower quadrant pain: Secondary | ICD-10-CM

## 2012-04-22 DIAGNOSIS — R1032 Left lower quadrant pain: Secondary | ICD-10-CM

## 2012-04-22 MED ORDER — GADOBENATE DIMEGLUMINE 529 MG/ML IV SOLN
17.0000 mL | Freq: Once | INTRAVENOUS | Status: AC | PRN
  Administered 2012-04-22: 17 mL via INTRAVENOUS

## 2012-04-25 ENCOUNTER — Telehealth: Payer: Self-pay | Admitting: *Deleted

## 2012-04-25 LAB — ROCKY MTN SPOTTED FVR AB, IGG-BLOOD: RMSF IgG: 0.34 IV

## 2012-04-25 NOTE — Telephone Encounter (Signed)
Pt of [name]'s calling for refill on pain med. Is this OK to fill?

## 2012-04-25 NOTE — Telephone Encounter (Signed)
The Percocet did not appear to be a long-term medicine but more of a bridge onto a clear diagnosis was made from her visit in September. I would imagine if she still had pain without a clear cut diagnosis she needs to see [name] for followup or permission to refill this prescription.

## 2012-04-25 NOTE — Telephone Encounter (Signed)
LMOM informing Pt  

## 2012-04-26 ENCOUNTER — Telehealth: Payer: Self-pay | Admitting: Physician Assistant

## 2012-04-26 NOTE — Telephone Encounter (Signed)
I do not prescribe patches. Out of my scope. Call pain management. They can definitely help you with pain control.

## 2012-04-26 NOTE — Telephone Encounter (Signed)
Call pt: I have been told that doctor for infectious disease stated he would not see her. I am calling my previous professor and trying to get some answers from him and see if he will see her. He is infectious disease also. Will let you know any progress.

## 2012-04-26 NOTE — Telephone Encounter (Signed)
LMOM informing Pt  

## 2012-04-27 NOTE — Telephone Encounter (Signed)
Husband aware. He also noticed that she has rash on back around spine. Also noticed very dark stool was noted this AM. Pt wanted to if this could be caused by iron or anti viral meds. Please advise.

## 2012-04-28 NOTE — Telephone Encounter (Signed)
Yes Iron can cause dark stools. Rash I'm not sure about try OTC hydrocortisone cream and watch.

## 2012-04-28 NOTE — Telephone Encounter (Signed)
Pt's husband aware...

## 2012-05-01 ENCOUNTER — Encounter: Payer: Self-pay | Admitting: *Deleted

## 2012-05-03 ENCOUNTER — Telehealth: Payer: Self-pay

## 2012-05-03 MED ORDER — PREGABALIN 75 MG PO CAPS: 75.0000 mg | ORAL_CAPSULE | Freq: Two times a day (BID) | ORAL | Status: DC

## 2012-05-03 NOTE — Telephone Encounter (Signed)
I am unable to change a dose and if we are going to send the new prescription it will have to go through you, Dr Benjamin Stain, first.

## 2012-05-03 NOTE — Telephone Encounter (Signed)
Oh she needs a refill.  Lyrica is a controlled substance, I will leave the rx in my outbox.

## 2012-05-03 NOTE — Telephone Encounter (Signed)
Osiris's husband states her Lyrica was increased to 2 tablets of the 75 mg bid. I see in her note the increase but was unsure if this is long term. Please advise if the increase and new prescription is appropriate.

## 2012-05-03 NOTE — Telephone Encounter (Signed)
Was this meant to go to [place]? PS that is a safe dose.

## 2012-05-05 ENCOUNTER — Other Ambulatory Visit: Payer: Self-pay | Admitting: *Deleted

## 2012-05-05 ENCOUNTER — Encounter: Payer: Self-pay | Admitting: Family Medicine

## 2012-05-05 ENCOUNTER — Ambulatory Visit (INDEPENDENT_AMBULATORY_CARE_PROVIDER_SITE_OTHER): Payer: BC Managed Care – PPO | Admitting: Family Medicine

## 2012-05-05 VITALS — BP 116/75 | HR 56 | Temp 98.3°F | Wt 178.0 lb

## 2012-05-05 DIAGNOSIS — R300 Dysuria: Secondary | ICD-10-CM

## 2012-05-05 LAB — POCT URINALYSIS DIPSTICK
Bilirubin, UA: NEGATIVE
Glucose, UA: NEGATIVE
Leukocytes, UA: NEGATIVE
Nitrite, UA: NEGATIVE
Urobilinogen, UA: 0.2

## 2012-05-05 MED ORDER — CIPROFLOXACIN HCL 250 MG PO TABS: ORAL_TABLET | ORAL | Status: DC

## 2012-05-05 MED ORDER — PREGABALIN 75 MG PO CAPS: ORAL_CAPSULE | ORAL | Status: DC

## 2012-05-05 NOTE — Progress Notes (Signed)
CC: [name] is a 46 y.o. female is here for Urinary Tract Infection   Subjective: HPI:  One day of dysuria, described as pain after voiding, with pungent odor to urine with occasional gross hematuria. Symptoms are improved when not urinating. No interventions as of yet. Pain is described as a burning and stabbing, nonradiating. No other genitourinary complaints. She is not currently menstruating. She's a history of kidney stones but has no flank pain. She denies fevers, chills, abdominal pain, nausea. She feels somewhat bloated   Review Of Systems Outlined In HPI  Past Medical History  Diagnosis Date  . Urinary calculus or stone   . Gastric ulcer   . Cancer     cervical  . MVP (mitral valve prolapse)      No family history on file.   History  Substance Use Topics  . Smoking status: Current Every Day Smoker -- 1.0 packs/day for 20 years    Types: Cigarettes  . Smokeless tobacco: Not on file  . Alcohol Use: 1.0 oz/week    2 drink(s) per week     Comment: per week     Objective: Filed Vitals:   05/05/12 1404  BP: 116/75  Pulse: 56  Temp: 98.3 F (36.8 C)    General: Alert and Oriented, No Acute Distress HEENT: Moist mucous membranes Abdomen: Soft obese no palpable masses nontender Back: No CVA tenderness   Assessment & Plan: [name] was seen today for urinary tract infection.  Diagnoses and associated orders for this visit:  Dysuria - POCT Urinalysis Dipstick - ciprofloxacin (CIPRO) 250 MG tablet; Take one by mouth twice a day for five days. - Urine culture    Urinalysis moderate blood, treating empirically with Cipro, culture from September reviewed showing pansensitive Escherichia coli. Patient to start over-the-counter Pyridium.Signs and symptoms requring emergent/urgent reevaluation were discussed with the patient. Culture obtained Return if symptoms worsen or fail to improve.

## 2012-05-08 ENCOUNTER — Telehealth: Payer: Self-pay | Admitting: Physician Assistant

## 2012-05-08 NOTE — Telephone Encounter (Signed)
Patient's husband called request to know if you can call him back he has a question about Hormones and results concerning the patient. Thanks

## 2012-05-09 ENCOUNTER — Telehealth: Payer: Self-pay | Admitting: Family Medicine

## 2012-05-09 LAB — URINE CULTURE

## 2012-05-09 MED ORDER — SULFAMETHOXAZOLE-TRIMETHOPRIM 800-160 MG PO TABS: ORAL_TABLET | ORAL | Status: AC

## 2012-05-09 NOTE — Telephone Encounter (Signed)
Sue Lush notified patient of pan-sensitive cipro, she still reports burning/pelvic cramping only at end of urination.  Will try Bactrim.

## 2012-05-22 ENCOUNTER — Telehealth: Payer: Self-pay | Admitting: *Deleted

## 2012-05-22 NOTE — Telephone Encounter (Signed)
Called [facility] about other business and this pt husband asks if you filled out her paperwork from [employer] her employer to be out of work indefinitely. Says he spoke with Marcelino Duster last week but I dont see anything in here and unclear of what he is exactly needing

## 2012-05-22 NOTE — Telephone Encounter (Signed)
Pt.notified

## 2012-05-22 NOTE — Telephone Encounter (Signed)
I did fill paperwork for patient to be out of work and sent in.   I did not make copies kim I probably forgot so you will not see them in here.

## 2012-05-26 ENCOUNTER — Other Ambulatory Visit: Payer: Self-pay | Admitting: Sports Medicine

## 2012-05-29 ENCOUNTER — Emergency Department (INDEPENDENT_AMBULATORY_CARE_PROVIDER_SITE_OTHER)
Admission: EM | Admit: 2012-05-29 | Discharge: 2012-05-29 | Disposition: A | Discharge status: Home / Self Care | Payer: BC Managed Care – PPO | Source: Home / Self Care | Attending: Family Medicine | Admitting: Family Medicine

## 2012-05-29 ENCOUNTER — Encounter: Payer: Self-pay | Admitting: *Deleted

## 2012-05-29 ENCOUNTER — Telehealth: Payer: Self-pay | Admitting: *Deleted

## 2012-05-29 DIAGNOSIS — Z111 Encounter for screening for respiratory tuberculosis: Secondary | ICD-10-CM

## 2012-05-29 DIAGNOSIS — R300 Dysuria: Secondary | ICD-10-CM

## 2012-05-29 DIAGNOSIS — R197 Diarrhea, unspecified: Secondary | ICD-10-CM

## 2012-05-29 DIAGNOSIS — K529 Noninfective gastroenteritis and colitis, unspecified: Secondary | ICD-10-CM

## 2012-05-29 LAB — POCT URINALYSIS DIP (MANUAL ENTRY)
Ketones, POC UA: NEGATIVE
Leukocytes, UA: NEGATIVE
Spec Grav, UA: >=1.03 (ref 1.005–1.03)
pH, UA: 5.5 (ref 5–8)

## 2012-05-29 MED ORDER — OXYCODONE-ACETAMINOPHEN 5-325 MG PO TABS
1.0000 | ORAL_TABLET | Freq: Four times a day (QID) | ORAL | Status: DC | PRN
Start: 2012-05-29 — End: 2012-07-28

## 2012-05-29 MED ORDER — TUBERCULIN PPD 5 UNIT/0.1ML ID SOLN
5.0000 [IU] | Freq: Once | INTRADERMAL | Status: AC
  Administered 2012-05-29: 5 [IU] via INTRADERMAL

## 2012-05-29 MED ORDER — KETOROLAC TROMETHAMINE 60 MG/2ML IM SOLN
60.0000 mg | Freq: Once | INTRAMUSCULAR | Status: AC
  Administered 2012-05-29: 60 mg via INTRAMUSCULAR

## 2012-05-29 MED ORDER — NITROFURANTOIN MONOHYD MACRO 100 MG PO CAPS: 100.0000 mg | ORAL_CAPSULE | Freq: Two times a day (BID) | ORAL | Status: DC

## 2012-05-29 NOTE — Telephone Encounter (Signed)
Pt left a message on my voice stating she is having extreme pain urinating and her urine has a foul smell.Pt wanted to come in to drop off a urine specimen or if she had to come in to be seen she wanted to come in at 4 pm or first thing the next  morning. Called pt and advised pt that she would need to be seen to be eval and that we now only do injections and coumadin checks for nurse visit now.Offered Dr. Gardner Candle first avail at 245. Pt states she couldn't wait. Offered her an appt with Jade at 100. Pt then states she vomited a little while ago. I told pt that with her sxs she definitely needs to be seen by a provider and the appt offered were the first avail. Pt states she will go to [facility].

## 2012-05-29 NOTE — ED Notes (Signed)
Pt c/o dysuria x 3 days. Pt also c/o severe abd pain x 1 day, on and off x 09/2011'. She also c/o diarrhea x 42yrs. She has taken tylenol and percocet for pain. Denies fever.

## 2012-05-29 NOTE — ED Provider Notes (Signed)
History     CSN: [id number]  Arrival date & time 05/29/12  1038   First MD Initiated Contact with Patient 05/29/12 1133      Chief Complaint  Patient presents with  . Dysuria  . Abdominal Pain      HPI Comments: Patient complains of 3 day history of urinary frequency, urgency, malodorous urine, nocturia, abdominal pain, and chills/sweats but no fever.  She has also had nausea without vomiting. She states that she has a long history of IBS and chronic recurring diarrhea (10 years), with no recent changes.   She also requests a PPD.  Patient is a 46 y.o. female presenting with dysuria. The history is provided by the patient.  Dysuria  This is a new problem. Episode onset: 3 days ago. The problem occurs every urination. The problem has not changed since onset.The quality of the pain is described as burning. The pain is moderate. There has been no fever. Associated symptoms include chills, sweats, nausea, frequency, hesitancy and urgency. Pertinent negatives include no vomiting, no discharge, no hematuria and no flank pain.    Past Medical History  Diagnosis Date  . Urinary calculus or stone   . Gastric ulcer   . Cancer     cervical  . MVP (mitral valve prolapse)     Past Surgical History  Procedure Date  . Appendectomy   . Abdominal hysterectomy 2008    with oophorectomy for cancer  . Tubal ligation   . Tonsillectomy     History reviewed. No pertinent family history.  History  Substance Use Topics  . Smoking status: Current Every Day Smoker -- 1.0 packs/day for 20 years    Types: Cigarettes  . Smokeless tobacco: Not on file  . Alcohol Use: 1.0 oz/week    2 drink(s) per week     Comment: per week    OB History    Grav Para Term Preterm Abortions TAB SAB Ect Mult Living                  Review of Systems  Constitutional: Positive for chills.  Gastrointestinal: Positive for nausea and diarrhea. Negative for vomiting and blood in stool.  Genitourinary:  Positive for dysuria, hesitancy, urgency and frequency. Negative for hematuria and flank pain.  All other systems reviewed and are negative.    Allergies  Review of patient's allergies indicates no known allergies.  Home Medications   Current Outpatient Rx  Name  Route  Sig  Dispense  Refill  . ACYCLOVIR 400 MG PO TABS   Oral   Take 1 tablet (400 mg total) by mouth 3 (three) times daily. For 10 days.   30 tablet   0   . ALPRAZOLAM 0.5 MG PO TABS   Oral   Take 1 tablet (0.5 mg total) by mouth at bedtime as needed for sleep.   30 tablet   0   . ALPRAZOLAM 1 MG PO TABS   Oral   Take 1 tablet (1 mg total) by mouth 2 (two) times daily as needed for anxiety.   30 tablet   0   . ATENOLOL 25 MG PO TABS   Oral   Take 25 mg by mouth daily.         . BUDESONIDE ER 3 MG PO CP24   Oral   Take 3 tablets by mouth Daily.         [name] CITALOPRAM HYDROBROMIDE 20 MG PO TABS   Oral   Take  20 mg by mouth daily.         [name] ESTRADIOL 1 MG PO TABS   Oral   Take 1 tablet by mouth Daily.         . IBUPROFEN 800 MG PO TABS   Oral   Take 800 mg by mouth every 8 (eight) hours as needed.           [name] LYRICA 75 MG PO CAPS      TAKE 2 CAPSULES BY MOUTH TWICE A DAY   120 capsule   0   . NITROFURANTOIN MONOHYD MACRO 100 MG PO CAPS   Oral   Take 1 capsule (100 mg total) by mouth 2 (two) times daily.   14 capsule   0   . OXYCODONE-ACETAMINOPHEN 5-325 MG PO TABS      1-2 tab po q 6 hrs prn severe pain   40 tablet   0   . OXYCODONE-ACETAMINOPHEN 5-325 MG PO TABS   Oral   Take 1 tablet by mouth every 6 (six) hours as needed for pain.   10 tablet   0   . PERPHENAZINE-AMITRIPTYLINE 2-25 MG PO TABS   Oral   Take 1 tablet by mouth Daily.           BP 121/72  Pulse 54  Temp 97.7 F (36.5 C) (Oral)  Resp 16  Wt 186 lb (84.369 kg)  SpO2 98%  Physical Exam Nursing notes and Vital Signs reviewed. Appearance:  Patient appears stated age, and in no acute distress Eyes:   Pupils are equal, round, and reactive to light and accomodation.  Extraocular movement is intact.  Conjunctivae are not inflamed   Pharynx:  Normal; moist mucous membranes  Neck:  Supple.  No adenopathy Lungs:  Clear to auscultation.  Breath sounds are equal.  Heart:  Regular rate and rhythm without murmurs, rubs, or gallops.  Abdomen:  Nontender without masses or hepatosplenomegaly.  Bowel sounds are present.  No CVA or flank tenderness.  Extremities:  No edema.  No calf tenderness Skin:  No rash present.   ED Course  Procedures none    Labs Reviewed  POCT URINALYSIS DIP (MANUAL ENTRY) small BIL, small BLO, otherwise negative  URINE CULTURE pending       1. Dysuria   2. Chronic diarrhea; IBS       MDM  Urine culture pending.  PPD at patient's request. Begin Macrobid. To decrease diarrhea, mix one heaping tablespoon Citrucel (methylcellulose) in 8 oz water and drink one to three times daily.  Followup with PCP        Lattie Haw, MD 06/05/12 1057

## 2012-05-31 ENCOUNTER — Emergency Department
Admission: EM | Admit: 2012-05-31 | Discharge: 2012-05-31 | Disposition: A | Discharge status: Home / Self Care | Payer: Self-pay | Source: Home / Self Care

## 2012-05-31 ENCOUNTER — Telehealth: Payer: Self-pay | Admitting: Physician Assistant

## 2012-05-31 NOTE — Telephone Encounter (Signed)
LMOM to return call.

## 2012-05-31 NOTE — Telephone Encounter (Signed)
I got the report back from neurologist. Released you back to me. Everything was negative. I really think at this point we are going to have to work on treating the pain with pain clinic. You do infact have CMV and EBV so as these viruses run there course perhaps you will begin to feel better.

## 2012-05-31 NOTE — ED Notes (Signed)
Patient is here for PPD reading. LFA PPD is negative.

## 2012-06-01 NOTE — Telephone Encounter (Signed)
Pt notified of instructions

## 2012-06-15 ENCOUNTER — Telehealth: Payer: Self-pay | Admitting: *Deleted

## 2012-06-15 NOTE — Telephone Encounter (Signed)
Husband calls back and states the name of person who put [name] OOW at pain management is [employer]. Said you could call her with any questions

## 2012-06-16 NOTE — Telephone Encounter (Signed)
I wrote patient out of work until was seen by neurologist. I suggest sending same paperwork to pain management/neurology to get there written response.

## 2012-06-19 ENCOUNTER — Encounter: Payer: Self-pay | Admitting: Physician Assistant

## 2012-06-19 ENCOUNTER — Ambulatory Visit (INDEPENDENT_AMBULATORY_CARE_PROVIDER_SITE_OTHER): Payer: BC Managed Care – PPO | Admitting: Physician Assistant

## 2012-06-19 ENCOUNTER — Other Ambulatory Visit: Payer: Self-pay | Admitting: Physician Assistant

## 2012-06-19 VITALS — BP 111/69 | HR 62 | Wt 183.0 lb

## 2012-06-19 DIAGNOSIS — R233 Spontaneous ecchymoses: Secondary | ICD-10-CM

## 2012-06-19 DIAGNOSIS — R300 Dysuria: Secondary | ICD-10-CM

## 2012-06-19 DIAGNOSIS — I998 Other disorder of circulatory system: Secondary | ICD-10-CM

## 2012-06-19 DIAGNOSIS — R109 Unspecified abdominal pain: Secondary | ICD-10-CM

## 2012-06-19 DIAGNOSIS — R1030 Lower abdominal pain, unspecified: Secondary | ICD-10-CM

## 2012-06-19 DIAGNOSIS — G894 Chronic pain syndrome: Secondary | ICD-10-CM

## 2012-06-19 DIAGNOSIS — R58 Hemorrhage, not elsewhere classified: Secondary | ICD-10-CM

## 2012-06-19 LAB — POCT URINALYSIS DIPSTICK
Nitrite, UA: NEGATIVE
Spec Grav, UA: 1.02
Urobilinogen, UA: 0.2

## 2012-06-19 NOTE — Progress Notes (Signed)
  Subjective:    Patient ID: [name], female    DOB: 08-Apr-1966, 47 y.o.   MRN: [id number]  HPI Patient comes into with ongoing dysuria. She was sent in November and did have a UTI and treated. She was seen at [facility] in December and did not have UTI. She is having same intermittent symptoms. Denies fever, chills. She has continual abdominal pain. Being managaged for pain at the pain clinic.  No answers for chronic pain after being sent to GI, urologist, neurologist. Pt wants referral to GYN. She also has the bruise that stays most of the time but gets worse when she is doing worse and starts to go away when she feels better. Would like to consider a biopsy.   Review of Systems     Objective:   Physical Exam  Constitutional: She is oriented to person, place, and time. She appears well-developed and well-nourished.  HENT:  Head: Normocephalic and atraumatic.  Cardiovascular: Normal rate, regular rhythm and normal heart sounds.   Abdominal:       Any touch to abdomen is very painful.  Neurological: She is alert and oriented to person, place, and time.  Skin:       Ecchymosis on right wrist. Not tender to touch.   Psychiatric: She has a normal mood and affect. Her behavior is normal.          Assessment & Plan:  Dysuria-UA positive for blood but negative for leuks or nitrates. Will send for culture and treat if bacteria is present. Discussed with patient that I believe that symptoms of generalized pain I believe is affecting her urinary system. She has already seen Urologist. I think she needs to see a GyN to see about endometrosis. Will refer. Did advise patient to stop estrace for the time being and see if that helps with any of her symptoms.   Ecchymosis- It appears to be a benign bruise on her right wrist. It has not gone away and if it does start to dimish it comes back. Previous doctor discussed biopsy since she has all the these vague symptoms. Will send to derm for evaluation.    Chronic pain syndrome/lower abdominal pain-Patient brings me paperwork to fill out for work. She also brings in evaulation from pain clinic and recommendation to be written out of work indefinetely. I did write out for 3 months. She has been to GI, Urology, Neurology and no answers. She is currently being managed by pain management. Will refer to GYN to see if any answers there. She has a total hysterectomy.   Spent 30 minutes with patient and greater than 50 percent of time spent counseling patient reguarding next steps for pain control and answers for pain.

## 2012-06-19 NOTE — Patient Instructions (Signed)
Will refer to dermatology and OBGYN.

## 2012-06-21 ENCOUNTER — Encounter: Payer: Self-pay | Admitting: Physician Assistant

## 2012-06-21 DIAGNOSIS — G894 Chronic pain syndrome: Secondary | ICD-10-CM | POA: Insufficient documentation

## 2012-06-21 DIAGNOSIS — M47812 Spondylosis without myelopathy or radiculopathy, cervical region: Secondary | ICD-10-CM | POA: Insufficient documentation

## 2012-06-21 DIAGNOSIS — M5137 Other intervertebral disc degeneration, lumbosacral region: Secondary | ICD-10-CM | POA: Insufficient documentation

## 2012-06-21 DIAGNOSIS — M5030 Other cervical disc degeneration, unspecified cervical region: Secondary | ICD-10-CM | POA: Insufficient documentation

## 2012-06-21 DIAGNOSIS — M797 Fibromyalgia: Secondary | ICD-10-CM | POA: Insufficient documentation

## 2012-06-21 DIAGNOSIS — G5603 Carpal tunnel syndrome, bilateral upper limbs: Secondary | ICD-10-CM | POA: Insufficient documentation

## 2012-06-22 ENCOUNTER — Other Ambulatory Visit: Payer: Self-pay | Admitting: Physician Assistant

## 2012-06-22 MED ORDER — NITROFURANTOIN MONOHYD MACRO 100 MG PO CAPS: 100.0000 mg | ORAL_CAPSULE | Freq: Two times a day (BID) | ORAL | Status: DC

## 2012-07-04 ENCOUNTER — Telehealth: Payer: Self-pay | Admitting: *Deleted

## 2012-07-04 NOTE — Telephone Encounter (Signed)
Pt calls and states has been in severe pain all weekend. She finished the med for UTI and still not feeling better. States she has to push hard just to urinate

## 2012-07-05 ENCOUNTER — Telehealth: Payer: Self-pay | Admitting: *Deleted

## 2012-07-05 NOTE — Telephone Encounter (Signed)
When being managed by pain clinic by law I am not permitted to be giving you pain medication. Call pain clinic. Call urologist about urinary retention. If not urinating then need to go to Emergency Room to be catherized to get excess urine out of body. Would need another urine culture to see if bacteria is present. You could come in to get that done or go to ER for all of above.

## 2012-07-05 NOTE — Telephone Encounter (Signed)
See previous telephone call

## 2012-07-05 NOTE — Telephone Encounter (Signed)
Pt is going to make an appt to give a urine sample so it can be sent for a culture.

## 2012-07-05 NOTE — Telephone Encounter (Signed)
Pt calls & states that she has been in extreme pain since Monday.  She has been taking a half of a percocet in addition to her patch to help "get out of the fetal position".  She is asking if you can give her a prescription for the percocet.  Also she states that she finished the antibiotic for her UTI on Friday but is still experiencing symptoms.  Says she is having to push to try to urinate & it only trickles out.  Please advise

## 2012-07-06 ENCOUNTER — Ambulatory Visit (INDEPENDENT_AMBULATORY_CARE_PROVIDER_SITE_OTHER): Payer: BC Managed Care – PPO | Admitting: Family Medicine

## 2012-07-06 ENCOUNTER — Encounter: Payer: Self-pay | Admitting: Family Medicine

## 2012-07-06 VITALS — BP 101/62 | HR 62 | Temp 97.9°F | Wt 192.0 lb

## 2012-07-06 DIAGNOSIS — R300 Dysuria: Secondary | ICD-10-CM

## 2012-07-06 LAB — POCT URINALYSIS DIPSTICK
Glucose, UA: NEGATIVE
Spec Grav, UA: >=1.03

## 2012-07-06 MED ORDER — SULFAMETHOXAZOLE-TRIMETHOPRIM 800-160 MG PO TABS: ORAL_TABLET | ORAL | Status: AC

## 2012-07-06 NOTE — Progress Notes (Signed)
CC: Sam Wunschel is a 47 y.o. female is here for Dysuria   Subjective: HPI:  Patient presents with 3 days of worsening dysuria. Moderate in severity. Present at the urethra and just proximally, noticeable at rest but slightly improved, worsened with active urination. That occur anytime of the day. Nothing has made better or worse, she tried peridium without much help. She also reports urgency, and decreased urine production with frequency.  Denies fevers, chills, flank pain, abdominal pain, nausea, change in odor color or consistency of her urine, vaginal discharge.  She finished Macrobid about 3 days ago.   Review Of Systems Outlined In HPI  Past Medical History  Diagnosis Date  . Urinary calculus or stone   . Gastric ulcer   . Cancer     cervical  . MVP (mitral valve prolapse)      No family history on file.   History  Substance Use Topics  . Smoking status: Current Every Day Smoker -- 1.0 packs/day for 20 years    Types: Cigarettes  . Smokeless tobacco: Not on file  . Alcohol Use: 1.0 oz/week    2 drink(s) per week     Comment: per week     Objective: Filed Vitals:   07/06/12 1121  BP: 101/62  Pulse: 62  Temp: 97.9 F (36.6 C)    General: Alert and Oriented, No Acute Distress HEENT: Pupils equal, round, reactive to light. Conjunctivae clear.  Moist mucous membranes,  Lungs: Clear comfortable work of breathing Cardiac: Regular rate and rhythm.    Mental Status: No depression, anxiety, nor agitation. Skin: Warm and dry.  Assessment & Plan: Kadian was seen today for dysuria.  Diagnoses and associated orders for this visit:  Dysuria - Urinalysis Dipstick - Urine Culture - sulfamethoxazole-trimethoprim (SEPTRA DS) 800-160 MG per tablet; One tab twice a day for five days.    Urinalysis obtained and suggestive of urinary tract infection, given recent sensitivities feel that Bactrim would be appropriate as empiric treatment. Urine culture obtained, and for the  patient they'll discuss this possibility with her PCP however she may be a candidate for daily antibiotic to minimize chances of future urinary tract infections  Return if symptoms worsen or fail to improve.

## 2012-07-09 LAB — URINE CULTURE: Colony Count: >=100000

## 2012-07-10 ENCOUNTER — Telehealth: Payer: Self-pay | Admitting: *Deleted

## 2012-07-10 ENCOUNTER — Telehealth: Payer: Self-pay | Admitting: Family Medicine

## 2012-07-10 DIAGNOSIS — N390 Urinary tract infection, site not specified: Secondary | ICD-10-CM

## 2012-07-10 NOTE — Telephone Encounter (Signed)
Pt notified of results

## 2012-07-10 NOTE — Telephone Encounter (Signed)
Sue Lush, Will you please let Mrs. Smanger know that the urine culture confirmed bacteria in her urine but it was not a variety of bacteria that they do antibiotic sensitivities on.  If she's still interested a daily suppressive antibiotic we'll first need a negative urine culture.  I'll put a ticket at the front desk for this and she can have this done at the lab downstairs at her convenience.

## 2012-07-10 NOTE — Telephone Encounter (Signed)
Called pt to notify her of results and she now states that she has  A lot of pain and now she cannot control her urine at all. Advised pt if she is in severe pain then she should go to the ED. Otherwise she needs to f/u with PCP.pt wanted me to let [name] know her sxs

## 2012-07-10 NOTE — Telephone Encounter (Signed)
Let pt know that all we could do is Urine dipstick and send for culture and since she was recently seen for this I do suggest either going to ER or making appt with urologist.

## 2012-07-11 NOTE — Telephone Encounter (Signed)
Pt's spouse notified.

## 2012-07-20 ENCOUNTER — Encounter: Payer: Self-pay | Admitting: Obstetrics & Gynecology

## 2012-07-20 ENCOUNTER — Ambulatory Visit (INDEPENDENT_AMBULATORY_CARE_PROVIDER_SITE_OTHER): Payer: BC Managed Care – PPO | Admitting: Obstetrics & Gynecology

## 2012-07-20 VITALS — BP 97/64 | HR 58 | Temp 98.5°F | Resp 16 | Ht 66.0 in | Wt 187.0 lb

## 2012-07-20 DIAGNOSIS — N949 Unspecified condition associated with female genital organs and menstrual cycle: Secondary | ICD-10-CM

## 2012-07-20 DIAGNOSIS — G8929 Other chronic pain: Secondary | ICD-10-CM

## 2012-07-20 NOTE — Progress Notes (Signed)
  Subjective:    Patient ID: [name], female    DOB: [birth year], 47 y.o.   MRN: [id number]  HPI  47 yo MW lady with severe pelvic pain that radiates to her lower back for the last 9 months. She describes this pain as daily and has an extensive w/u including a colonoscopy, CT, MRI (reportedly with bulging discs). She has had a TVH in 2007 and a L/S BSO in 2012 by Dr. Christella Hartigan. She is also seeing [facility] and is scheduled for urodynamics due to her incontinence issues. She has seen a chiropractor and "spent $500 with no help".  Review of Systems Her mammogram is due but she declines to have it ordered.    Objective:   Physical Exam        Assessment & Plan:  Back and pelvic pain. In light of the fact that she has no gyn organs, I have recommended a consult with a general surgeon. Mrs. And Mr. Resnick were very frustrated that they had been referred to a gynecologist for a possible diagnosis of endometriosis when I explained that since all of her ovaries/gyn organs are gone, that she does not have endometriosis. I offered to not charge them for this visit to allay some of their frustration.

## 2012-07-25 ENCOUNTER — Other Ambulatory Visit: Payer: Self-pay | Admitting: *Deleted

## 2012-07-25 MED ORDER — CITALOPRAM HYDROBROMIDE 20 MG PO TABS: 20.0000 mg | ORAL_TABLET | Freq: Every day | ORAL | Status: DC

## 2012-07-26 ENCOUNTER — Encounter (INDEPENDENT_AMBULATORY_CARE_PROVIDER_SITE_OTHER): Payer: Self-pay | Admitting: General Surgery

## 2012-07-26 ENCOUNTER — Ambulatory Visit (INDEPENDENT_AMBULATORY_CARE_PROVIDER_SITE_OTHER): Payer: BC Managed Care – PPO | Admitting: General Surgery

## 2012-07-26 VITALS — BP 100/70 | HR 64 | Resp 14 | Ht 66.0 in | Wt 187.0 lb

## 2012-07-26 DIAGNOSIS — R109 Unspecified abdominal pain: Secondary | ICD-10-CM

## 2012-07-26 DIAGNOSIS — R1030 Lower abdominal pain, unspecified: Secondary | ICD-10-CM | POA: Insufficient documentation

## 2012-07-26 NOTE — Patient Instructions (Signed)
We will schedule you for diagnostic laparoscopy.  Diagnostic Laparoscopy Laparoscopy is a surgical procedure. It is used to diagnose and treat diseases inside the belly(abdomen). It is usually a brief, common, and relatively simple procedure. The laparoscopeis a thin, lighted, pencil-sized instrument. It is like a telescope. It is inserted into your abdomen through a small cut (incision). Your caregiver can look at the organs inside your body through this instrument. He or she can see if there is anything abnormal. Laparoscopy can be done either in a hospital or outpatient clinic. You may be given a mild sedative to help you relax before the procedure. Once in the operating room, you will be given a drug to make you sleep (general anesthesia). Laparoscopy usually lasts less than 1 hour. After the procedure, you will be monitored in a recovery area until you are stable and doing well. Once you are home, it will take 2 to 3 days to fully recover. RISKS AND COMPLICATIONS  Laparoscopy has relatively few risks. Your caregiver will discuss the risks with you before the procedure. Some problems that can occur include:  Infection.  Bleeding.  Damage to other organs.  Anesthetic side effects. PROCEDURE Once you receive anesthesia, your surgeon inflates the abdomen with a harmless gas (carbon dioxide). This makes the organs easier to see. The laparoscope is inserted into the abdomen through a small incision. This allows your surgeon to see into the abdomen. Other small instruments are also inserted into the abdomen through other small openings. Many surgeons attach a video camera to the laparoscope to enlarge the view. During a diagnostic laparoscopy, the surgeon may be looking for inflammation, infection, or cancer. Your surgeon may take tissue samples(biopsies). The samples are sent to a specialist in looking at cells and tissue samples (pathologist). The pathologist examines them under a microscope.  Biopsies can help to diagnose or confirm a disease. AFTER THE PROCEDURE   The gas is released from inside the abdomen.  The incisions are closed with stitches (sutures). Because these incisions are small (usually less than 1/2 inch), there is usually minimal discomfort after the procedure. There may be some mild discomfort in the throat. This is from the tube placed in the throat while you were sleeping. You may have some mild abdominal discomfort. There may also be discomfort from the instrument placement incisions in the abdomen.  The recovery time is shortened as long as there are no complications.  You will rest in a recovery room until stable and doing well. As long as there are no complications, you may be allowed to go home. FINDING OUT THE RESULTS OF YOUR TEST Not all test results are available during your visit. If your test results are not back during the visit, make an appointment with your caregiver to find out the results. Do not assume everything is normal if you have not heard from your caregiver or the medical facility. It is important for you to follow up on all of your test results. HOME CARE INSTRUCTIONS   Take all medicines as directed.  Only take over-the-counter or prescription medicines for pain, discomfort, or fever as directed by your caregiver.  Resume daily activities as directed.  Showers are preferred over baths.  You may resume sexual activities in 1 week or as directed.  Do not drive while taking narcotics. SEEK MEDICAL CARE IF:   There is increasing abdominal pain.  There is new pain in the shoulders (shoulder strap areas).  You feel lightheaded or faint.  You have the chills.  You or your child has an oral temperature above 102 F (38.9 C).  There is pus-like (purulent) drainage from any of the wounds.  You are unable to pass gas or have a bowel movement.  You feel sick to your stomach (nauseous) or throw up (vomit). MAKE SURE YOU:    Understand these instructions.  Will watch your condition.  Will get help right away if you are not doing well or get worse. Document Released: 09/06/2000 Document Revised: 08/23/2011 Document Reviewed: 05/31/2007 [facility] Patient Information 2013 [place], [place].

## 2012-07-26 NOTE — Progress Notes (Signed)
Patient ID: [name], female   DOB: 07/08/1965, 47 y.o.   MRN: [id number]  Chief Complaint  Patient presents with  . Abdominal Pain    HPI [name] is a 47 y.o. female.  HPI 47 yo WF referred by Dr Myra Dove for evaluation of chronic lower abdominal pain. The patient is accompanied by her husband today. The patient states that she has had daily persistent lower abdominal pain since May 2013. She states that she has seen numerous specialists and no one can find a diagnosis or etiology for her lower abdominal pain. She describes her pain as a knife stabbing her. It radiates to her back and down both thighs. It is also a burning and stinging discomfort. She states that her symptoms began about 6 months after having her last ovary removed. She states that her ovary was removed because it had a large cyst in it. She states the quality of her pain is the same intensity as it was in May; however, her functional status has significantly worsened over the past 2 or 3 months. She states that she is not sleeping as well. She states that walking makes the pain worse. She states that she cannot do her daily activities because of the discomfort. She also states that over the past several weeks she has started to experience worsening bladder control. She states that she is incontinent of urine. She also states that she has a significant difficulty in starting her stream. She states that she has to significantly bear down to start her stream and it causes a lot of pain when she does so. She has been treated for numerous UTIs over the past year. Her most recent urinalysis showed more than 100,000 lactobacillus. She is scheduled to have urodynamics done at a urology office in [place] in the next few weeks.  She has seen numerous physicians for her lower abdominal pain and discomfort. She underwent a colonoscopy in the summer which revealed lymphocytic colitis. She states that she took budenoside for 3  months Without much change or improvement in her symptoms. She states that she was switched to a different medication, Uceris, by different gastroenterologist and took that for about 6 months without much change in her symptoms as well. During this time she has had loose stools. She states that she has had about 10 years history of diarrhea where she would have 4-6 loose watery bowel movements a day. This persisted when her abdominal pain started. However when she started to be placed on narcotics for her abdominal pain she is now having one loose watery stool about every other day. She does endorse some bloating. She denies any melena or hematochezia. She states that her appetite is decreased however she has gained approximately 30 pounds since all this started. She denies any heavy NSAID use. She denies any nausea. However she states when she gets severe pain it will make her vomit. She states that she has had numerous CT scans as well as an MRI over the past several months to try to find the cause of her pain. She states that she has had numerous lab work done which is all been negative. She was tested for arsenic, lead and other heavy metals which were negative. She had a GI pathogen profile done at the end of January which was all negative. She said that she tested negative for gluten hypersensitivity. She had an MRI of her back That showed central disc herniation at T8-9 that effaces the subarachnoid   space but there is no evidence of cord compression. She states that she was seen by 2 different neurologists. She was told that they did not think that her pain was neuropathic in origin. She also received injections to her back which did not help with her abdominal pain. She was recently saw Dr. Dove, an OB/GYN, in consideration for possible  Endometriosis as an explanation for her pain.  She is also seeing Dr. Spivey at the pain management clinic.   Past Medical History  Diagnosis Date  . Urinary calculus  or stone   . Gastric ulcer   . Cancer     cervical  . MVP (mitral valve prolapse)   . IBS (irritable bowel syndrome)     Past Surgical History  Procedure Laterality Date  . Appendectomy  2005  . Partial hysterectomy  2005  . Tubal ligation  2000  . Tonsillectomy  30 years ago  . Dilation and curettage of uterus      x 4  one after each SAB  . Bilateral oophorectomy  2012  . Exploratory laparotomy  2007    Family History  Problem Relation Age of Onset  . Heart attack Mother   . Cancer Father     tongue  . Cirrhosis Father   . Cirrhosis Mother     Social History History  Substance Use Topics  . Smoking status: Current Every Day Smoker -- 0.50 packs/day for 20 years    Types: Cigarettes  . Smokeless tobacco: Never Used  . Alcohol Use: 1.0 oz/week    2 drink(s) per week     Comment: per week    No Known Allergies  Current Outpatient Prescriptions  Medication Sig Dispense Refill  . ALPRAZolam (XANAX) 0.5 MG tablet Take 1 tablet (0.5 mg total) by mouth at bedtime as needed for sleep.  30 tablet  0  . atenolol (TENORMIN) 25 MG tablet Take 25 mg by mouth daily.      . citalopram (CELEXA) 20 MG tablet Take 1 tablet (20 mg total) by mouth daily.  90 tablet  1  . estradiol (ESTRACE) 1 MG tablet Take 1 mg by mouth daily.      . fentaNYL (DURAGESIC - DOSED MCG/HR) 50 MCG/HR Place 1 patch onto the skin every 3 (three) days.      . ibuprofen (ADVIL,MOTRIN) 800 MG tablet Take 800 mg by mouth every 8 (eight) hours as needed.        . lidocaine (LIDODERM) 5 %       . oxyCODONE-acetaminophen (PERCOCET/ROXICET) 5-325 MG per tablet Take 1 tablet by mouth every 6 (six) hours as needed for pain.  10 tablet  0   No current facility-administered medications for this visit.    Review of Systems Review of Systems  Constitutional: Positive for activity change and appetite change. Negative for fever and unexpected weight change.  HENT: Negative for hearing loss, nosebleeds and neck  pain.   Eyes: Negative for photophobia and visual disturbance.  Respiratory: Negative for apnea, chest tightness and shortness of breath.   Cardiovascular: Negative for chest pain, palpitations and leg swelling.  Gastrointestinal: Positive for nausea, abdominal pain and diarrhea. Negative for vomiting, blood in stool and rectal pain.  Genitourinary: Positive for frequency, difficulty urinating and pelvic pain. Negative for decreased urine volume.  Musculoskeletal: Negative for joint swelling and gait problem.       Low back pain, b/l hip pain  Allergic/Immunologic: Negative for food allergies.  Neurological: Positive for   headaches. Negative for dizziness, tremors, seizures, syncope, light-headedness and numbness.  Hematological: Negative for adenopathy. Does not bruise/bleed easily.  Psychiatric/Behavioral: Negative for behavioral problems.    Blood pressure 100/70, pulse 64, resp. rate 14, height 5' 6" (1.676 m), weight 187 lb (84.823 kg).  Physical Exam Physical Exam  Vitals reviewed. Constitutional: She is oriented to person, place, and time. She appears well-developed and well-nourished. No distress.  HENT:  Head: Normocephalic and atraumatic.  Right Ear: External ear normal.  Left Ear: External ear normal.  Eyes: Conjunctivae are normal. No scleral icterus.  Neck: Normal range of motion. Neck supple. No tracheal deviation present. No thyromegaly present.  Cardiovascular: Normal rate, regular rhythm and normal heart sounds.   Pulmonary/Chest: Effort normal and breath sounds normal. No respiratory distress. She has no wheezes. She has no rales.  Abdominal: Soft. Bowel sounds are normal. She exhibits no distension. There is tenderness in the right lower quadrant, suprapubic area and left lower quadrant. There is no rigidity, no rebound and no guarding. No hernia.  Well healed scattered trocar sites; mainly tender to light palpation in suprapubic/infraumbilical area, less so in LLQ/RLQ   Musculoskeletal: She exhibits no edema and no tenderness.  Neurological: She is alert and oriented to person, place, and time.  Skin: Skin is warm and dry. No rash noted. She is not diaphoretic. No erythema.  Psychiatric: She has a normal mood and affect. Judgment and thought content normal.  Tearful at times    Data Reviewed Dr Dove's note 07/20/12 Dr Hommel's note 1/23 [place] note 1/6, 11/22 Dr Metheney's note 05/25/12 Dr Connolley's note at [facility] 06/20/12 Dr Spivey's (pain clinic) note 06/15/12 Dr Weeks' notes at [facility] 11/19/11, 01/2012 Colonoscopy report and path - lymphocytic colitis Labs from 07/13/12 - nml CMET, CBC, TSH Normal GI Pathogen Profile from [facility] 07/13/12  CT abd/pelvis  MRI abd/pelvis  Assessment    Chronic lower abdominal pain Dysuria Urinary incontinence     Plan    I had a very extensive and prolonged discussion with the patient and her husband. I explained to him that I reviewed all the meds listed above as well as the CT scan of her abdomen pelvis, the MRI report, as well as her colonoscopy. I was quite up front with the patient and her husband that I do not have an explanation for her lower abdominal pain which radiates to both hips and down her thighs. Moreover I am not sure as to the etiology of her weak stream as well as pain with urination. I am not sure if the 2 are related. In addition I am not sure that there is a functional GI reason for her pain. They are concerned that she may have adhesive disease from her prior surgeries. I explained to them that in my experience I have never seen  Abdominal or pelvic adhesive disease cause pain to radiate to both thighs. The husband and the patient inquired about abdominal exploration.  I explained that very few rare surgical reports of performing an ileostomy for persistent lymphocytic colitis. However I am not sure that lymphocytic colitis and diversion of  her fecal stream would have any significant improvement with the constellation of her symptoms.  We also discussed that since it appears that she has only had laparoscopic procedures in the past that I doubt she has significant abdominal pelvic adhesive disease.  Nonetheless, the patient and her husband are very frustrated that no explanation can be found   for her pain and that she cannot find any source of improvement for it. Of note she did state that the Neurontin helped with her abdominal pain but it made her "drunk ".  I essentially had 2 options for the patient. We discussed a diagnostic laparoscopy versus referral to a large academic tertiary [facility]. With respect to diagnostic laparoscopy, We discussed the risk and benefits of surgery including but not limited to bleeding, infection, injury to surrounding structures, need to convert to an open procedure, enterotomy, blood clot formation, anesthesia concerns, ileus, hernia formation, and failure to ameliorate her symptoms. We also discussed the typical postoperative course. I explained that there was a low likelihood that I would find anything of significance on a diagnostic laparoscopy to explain her pain. I did discuss with the patient and her husband that if I did find something unusual and that if I didn't feel qualified handling it She may have to have an additional procedure in the future by a different surgeon. We discussed the possibility of lysing adhesions if any were found. I do not think it is unreasonable to offer her a diagnostic laparoscopy since she has had such an exhaustive workup and evaluation without a clear source for her discomfort; however, I was completely realistic with the patient and her husband about the possibility of this being a negative exploration. The husband also asked if we could evaluate the bladder at the time of surgery. I explained that we did look at the outside of the bladder however the functional  aspects of how the bladder functions is best determined by the urodynamic studies she is scheduled to get at the end of February  The patient and her husband have decided to proceed with a diagnostic laparoscopy. We will have to contact Dr. Spivey's office to discuss her postoperative pain management since she is under a pain management contract with him.  Shahrzad Koble M. Jeromiah Ohalloran, MD, FACS General, Bariatric, & Minimally Invasive Surgery [facility]        Keyarra Rendall M 07/26/2012, 11:07 AM    

## 2012-07-28 ENCOUNTER — Encounter (HOSPITAL_COMMUNITY): Payer: Self-pay | Admitting: Pharmacy Technician

## 2012-07-31 NOTE — Patient Instructions (Signed)
Gianni Fuchs  07/31/2012   Your procedure is scheduled on:  08/04/12   Report to [facility] at  0645  AM.  Call this number if you have problems the morning of surgery: [phone]   Remember:   Do not eat food or drink liquids after midnight.   Take these medicines the morning of surgery with A SIP OF WATER:    Do not wear jewelry, make-up or nail polish.  Do not wear lotions, powders, or perfumes.   Do not shave 48 hours prior to surgery.  Do not bring valuables to the hospital.  Contacts, dentures or bridgework may not be worn into surgery.         Patients discharged the day of surgery will not be allowed to drive  home.  Name and phone number of your driver:    SEE CHG INSTRUCTION SHEET    Please read over the following fact sheets that you were given: MRSA Information, coughing and deep breathing exercises, leg exercises               Failure to comply with these instructions may result in cancellation of your surgery.                Patient Signature ____________________________              Nurse Signature _____________________________

## 2012-08-01 ENCOUNTER — Encounter (HOSPITAL_COMMUNITY): Payer: Self-pay

## 2012-08-01 ENCOUNTER — Encounter (HOSPITAL_COMMUNITY)
Admission: RE | Admit: 2012-08-01 | Discharge: 2012-08-01 | Disposition: A | Discharge status: Home / Self Care | Payer: BC Managed Care – PPO | Source: Ambulatory Visit | Attending: General Surgery | Admitting: General Surgery

## 2012-08-01 HISTORY — DX: Anxiety disorder, unspecified: F41.9

## 2012-08-01 HISTORY — DX: Cardiac arrhythmia, unspecified: I49.9

## 2012-08-01 HISTORY — DX: Headache: R51

## 2012-08-01 HISTORY — DX: Cardiac murmur, unspecified: R01.1

## 2012-08-01 LAB — SURGICAL PCR SCREEN
MRSA, PCR: NEGATIVE
Staphylococcus aureus: NEGATIVE

## 2012-08-01 LAB — CBC
MCH: 31.6 pg (ref 26.0–34.0)
MCV: 94.7 fL (ref 78.0–100.0)
Platelets: 386 10*3/uL (ref 150–400)
RBC: 4.56 MIL/uL (ref 3.87–5.11)
RDW: 14.2 % (ref 11.5–15.5)

## 2012-08-01 LAB — BASIC METABOLIC PANEL
CO2: 29 mEq/L (ref 19–32)
Calcium: 9.5 mg/dL (ref 8.4–10.5)
Creatinine, Ser: 0.77 mg/dL (ref 0.50–1.10)

## 2012-08-01 NOTE — Progress Notes (Signed)
Last office visit with Dr Jens Som 12/22/11 [facility] ECHO 01/07/12 [facility]

## 2012-08-03 NOTE — Anesthesia Preprocedure Evaluation (Addendum)
Anesthesia Evaluation  Patient identified by MRN, date of birth, ID band Patient awake    Reviewed: Allergy & Precautions, H&P , NPO status , Patient's Chart, lab work & pertinent test results  Airway Mallampati: II TM Distance: >3 FB Neck ROM: Full    Dental  (+) Teeth Intact, Dental Advisory Given and Chipped,    Pulmonary Current Smoker,  breath sounds clear to auscultation  Pulmonary exam normal       Cardiovascular hypertension, Pt. on home beta blockers negative cardio ROS  + dysrhythmias (PVC) + Valvular Problems/Murmurs MVP Rhythm:Regular Rate:Normal     Neuro/Psych  Headaches, Anxiety Depression negative psych ROS   GI/Hepatic negative GI ROS, Neg liver ROS, PUD,   Endo/Other  negative endocrine ROS  Renal/GU Renal diseasenegative Renal ROS  negative genitourinary   Musculoskeletal  (+) Fibromyalgia -, narcotic dependent  Abdominal   Peds  Hematology negative hematology ROS (+)   Anesthesia Other Findings Chronic narcotic use/Chronic pain syndrome  Reproductive/Obstetrics negative OB ROS                          Anesthesia Physical Anesthesia Plan  ASA: III  Anesthesia Plan: General   Post-op Pain Management:    Induction: Intravenous  Airway Management Planned: Oral ETT  Additional Equipment:   Intra-op Plan:   Post-operative Plan: Extubation in OR  Informed Consent: I have reviewed the patients History and Physical, chart, labs and discussed the procedure including the risks, benefits and alternatives for the proposed anesthesia with the patient or authorized representative who has indicated his/her understanding and acceptance.   Dental advisory given  Plan Discussed with: CRNA  Anesthesia Plan Comments:         Anesthesia Quick Evaluation

## 2012-08-04 ENCOUNTER — Encounter (HOSPITAL_COMMUNITY): Payer: Self-pay | Admitting: *Deleted

## 2012-08-04 ENCOUNTER — Encounter (HOSPITAL_COMMUNITY): Payer: Self-pay | Admitting: Anesthesiology

## 2012-08-04 ENCOUNTER — Encounter (HOSPITAL_COMMUNITY)
Admission: RE | Disposition: A | Discharge status: Home / Self Care | Payer: Self-pay | Source: Ambulatory Visit | Attending: General Surgery

## 2012-08-04 ENCOUNTER — Ambulatory Visit (HOSPITAL_COMMUNITY)
Admission: RE | Admit: 2012-08-04 | Discharge: 2012-08-04 | Disposition: A | Discharge status: Home / Self Care | Payer: BC Managed Care – PPO | Source: Ambulatory Visit | Attending: General Surgery | Admitting: General Surgery

## 2012-08-04 ENCOUNTER — Ambulatory Visit (HOSPITAL_COMMUNITY): Payer: BC Managed Care – PPO | Admitting: Anesthesiology

## 2012-08-04 DIAGNOSIS — N289 Disorder of kidney and ureter, unspecified: Secondary | ICD-10-CM | POA: Insufficient documentation

## 2012-08-04 DIAGNOSIS — F329 Major depressive disorder, single episode, unspecified: Secondary | ICD-10-CM | POA: Insufficient documentation

## 2012-08-04 DIAGNOSIS — R32 Unspecified urinary incontinence: Secondary | ICD-10-CM | POA: Insufficient documentation

## 2012-08-04 DIAGNOSIS — F411 Generalized anxiety disorder: Secondary | ICD-10-CM | POA: Insufficient documentation

## 2012-08-04 DIAGNOSIS — R51 Headache: Secondary | ICD-10-CM | POA: Insufficient documentation

## 2012-08-04 DIAGNOSIS — Z79899 Other long term (current) drug therapy: Secondary | ICD-10-CM | POA: Insufficient documentation

## 2012-08-04 DIAGNOSIS — Z9089 Acquired absence of other organs: Secondary | ICD-10-CM | POA: Insufficient documentation

## 2012-08-04 DIAGNOSIS — R109 Unspecified abdominal pain: Secondary | ICD-10-CM

## 2012-08-04 DIAGNOSIS — IMO0001 Reserved for inherently not codable concepts without codable children: Secondary | ICD-10-CM | POA: Insufficient documentation

## 2012-08-04 DIAGNOSIS — Z90710 Acquired absence of both cervix and uterus: Secondary | ICD-10-CM | POA: Insufficient documentation

## 2012-08-04 DIAGNOSIS — I10 Essential (primary) hypertension: Secondary | ICD-10-CM | POA: Insufficient documentation

## 2012-08-04 DIAGNOSIS — F17200 Nicotine dependence, unspecified, uncomplicated: Secondary | ICD-10-CM | POA: Insufficient documentation

## 2012-08-04 DIAGNOSIS — G8929 Other chronic pain: Secondary | ICD-10-CM | POA: Insufficient documentation

## 2012-08-04 DIAGNOSIS — I499 Cardiac arrhythmia, unspecified: Secondary | ICD-10-CM | POA: Insufficient documentation

## 2012-08-04 DIAGNOSIS — Z9079 Acquired absence of other genital organ(s): Secondary | ICD-10-CM | POA: Insufficient documentation

## 2012-08-04 DIAGNOSIS — Z01812 Encounter for preprocedural laboratory examination: Secondary | ICD-10-CM | POA: Insufficient documentation

## 2012-08-04 DIAGNOSIS — R300 Dysuria: Secondary | ICD-10-CM | POA: Insufficient documentation

## 2012-08-04 DIAGNOSIS — F3289 Other specified depressive episodes: Secondary | ICD-10-CM | POA: Insufficient documentation

## 2012-08-04 HISTORY — PX: LAPAROSCOPY: SHX197

## 2012-08-04 SURGERY — LAPAROSCOPY, DIAGNOSTIC
Anesthesia: General | Site: Abdomen | Wound class: Clean

## 2012-08-04 MED ORDER — DEXAMETHASONE SODIUM PHOSPHATE 10 MG/ML IJ SOLN
INTRAMUSCULAR | Status: DC | PRN
  Administered 2012-08-04: 10 mg via INTRAVENOUS

## 2012-08-04 MED ORDER — ACETAMINOPHEN 10 MG/ML IV SOLN
INTRAVENOUS | Status: AC
  Filled 2012-08-04: qty 100

## 2012-08-04 MED ORDER — SODIUM CHLORIDE 0.9 % IJ SOLN: 3.0000 mL | INTRAMUSCULAR | Status: DC | PRN

## 2012-08-04 MED ORDER — SODIUM CHLORIDE 0.9 % IJ SOLN: 3.0000 mL | Freq: Two times a day (BID) | INTRAMUSCULAR | Status: DC

## 2012-08-04 MED ORDER — MIDAZOLAM HCL 5 MG/5ML IJ SOLN
INTRAMUSCULAR | Status: DC | PRN
  Administered 2012-08-04: 2 mg via INTRAVENOUS

## 2012-08-04 MED ORDER — SODIUM CHLORIDE 0.9 % IV SOLN: 250.0000 mL | INTRAVENOUS | Status: DC | PRN

## 2012-08-04 MED ORDER — OXYCODONE-ACETAMINOPHEN 5-325 MG PO TABS: 1.0000 | ORAL_TABLET | ORAL | Status: DC | PRN

## 2012-08-04 MED ORDER — GLYCOPYRROLATE 0.2 MG/ML IJ SOLN
INTRAMUSCULAR | Status: DC | PRN
  Administered 2012-08-04: .6 mg via INTRAVENOUS

## 2012-08-04 MED ORDER — KETOROLAC TROMETHAMINE 30 MG/ML IJ SOLN
INTRAMUSCULAR | Status: DC | PRN
  Administered 2012-08-04: 30 mg via INTRAVENOUS

## 2012-08-04 MED ORDER — ACETAMINOPHEN 325 MG PO TABS: 650.0000 mg | ORAL_TABLET | ORAL | Status: DC | PRN

## 2012-08-04 MED ORDER — OXYCODONE HCL 5 MG PO TABS: 5.0000 mg | ORAL_TABLET | ORAL | Status: DC | PRN

## 2012-08-04 MED ORDER — NEOSTIGMINE METHYLSULFATE 1 MG/ML IJ SOLN
INTRAMUSCULAR | Status: DC | PRN
  Administered 2012-08-04: 4 mg via INTRAVENOUS

## 2012-08-04 MED ORDER — 0.9 % SODIUM CHLORIDE (POUR BTL) OPTIME
TOPICAL | Status: DC | PRN
  Administered 2012-08-04: 1000 mL

## 2012-08-04 MED ORDER — FENTANYL CITRATE 0.05 MG/ML IJ SOLN
INTRAMUSCULAR | Status: DC | PRN
  Administered 2012-08-04: 50 ug via INTRAVENOUS
  Administered 2012-08-04: 100 ug via INTRAVENOUS
  Administered 2012-08-04 (×2): 50 ug via INTRAVENOUS

## 2012-08-04 MED ORDER — CEFAZOLIN SODIUM-DEXTROSE 2-3 GM-% IV SOLR
INTRAVENOUS | Status: AC
  Filled 2012-08-04: qty 50

## 2012-08-04 MED ORDER — SODIUM CHLORIDE 0.9 % IV SOLN: INTRAVENOUS | Status: DC

## 2012-08-04 MED ORDER — ACETAMINOPHEN 650 MG RE SUPP
650.0000 mg | RECTAL | Status: DC | PRN
  Filled 2012-08-04: qty 1

## 2012-08-04 MED ORDER — HYDROMORPHONE HCL PF 1 MG/ML IJ SOLN
INTRAMUSCULAR | Status: AC
  Filled 2012-08-04: qty 1

## 2012-08-04 MED ORDER — BUPIVACAINE-EPINEPHRINE 0.25% -1:200000 IJ SOLN
INTRAMUSCULAR | Status: AC
  Filled 2012-08-04: qty 1

## 2012-08-04 MED ORDER — HYDROMORPHONE HCL PF 1 MG/ML IJ SOLN: 0.2500 mg | INTRAMUSCULAR | Status: DC | PRN

## 2012-08-04 MED ORDER — BUPIVACAINE HCL (PF) 0.25 % IJ SOLN
INTRAMUSCULAR | Status: AC
  Filled 2012-08-04: qty 30

## 2012-08-04 MED ORDER — ROCURONIUM BROMIDE 100 MG/10ML IV SOLN
INTRAVENOUS | Status: DC | PRN
  Administered 2012-08-04: 30 mg via INTRAVENOUS
  Administered 2012-08-04: 10 mg via INTRAVENOUS

## 2012-08-04 MED ORDER — SUCCINYLCHOLINE CHLORIDE 20 MG/ML IJ SOLN
INTRAMUSCULAR | Status: DC | PRN
  Administered 2012-08-04: 100 mg via INTRAVENOUS

## 2012-08-04 MED ORDER — LACTATED RINGERS IV SOLN
INTRAVENOUS | Status: DC | PRN
  Administered 2012-08-04 (×2): via INTRAVENOUS

## 2012-08-04 MED ORDER — LACTATED RINGERS IV SOLN
INTRAVENOUS | Status: DC
Start: 2012-08-04 — End: 2012-08-04

## 2012-08-04 MED ORDER — MORPHINE SULFATE 10 MG/ML IJ SOLN: 2.0000 mg | INTRAMUSCULAR | Status: DC | PRN

## 2012-08-04 MED ORDER — ONDANSETRON HCL 4 MG/2ML IJ SOLN: 4.0000 mg | Freq: Four times a day (QID) | INTRAMUSCULAR | Status: DC | PRN

## 2012-08-04 MED ORDER — PROMETHAZINE HCL 25 MG/ML IJ SOLN: 6.2500 mg | INTRAMUSCULAR | Status: DC | PRN

## 2012-08-04 MED ORDER — KETOROLAC TROMETHAMINE 30 MG/ML IJ SOLN: 30.0000 mg | Freq: Four times a day (QID) | INTRAMUSCULAR | Status: DC

## 2012-08-04 MED ORDER — ACETAMINOPHEN 10 MG/ML IV SOLN
INTRAVENOUS | Status: DC | PRN
  Administered 2012-08-04: 1000 mg via INTRAVENOUS

## 2012-08-04 MED ORDER — PROPOFOL 10 MG/ML IV BOLUS
INTRAVENOUS | Status: DC | PRN
  Administered 2012-08-04: 120 mg via INTRAVENOUS

## 2012-08-04 MED ORDER — ONDANSETRON HCL 4 MG/2ML IJ SOLN
INTRAMUSCULAR | Status: DC | PRN
  Administered 2012-08-04: 4 mg via INTRAVENOUS

## 2012-08-04 MED ORDER — BUPIVACAINE-EPINEPHRINE 0.25% -1:200000 IJ SOLN
INTRAMUSCULAR | Status: DC | PRN
  Administered 2012-08-04: 33 mL

## 2012-08-04 MED ORDER — CEFAZOLIN SODIUM-DEXTROSE 2-3 GM-% IV SOLR
2.0000 g | INTRAVENOUS | Status: AC
  Administered 2012-08-04: 2 g via INTRAVENOUS

## 2012-08-04 SURGICAL SUPPLY — 66 items
ADH SKN CLS APL DERMABOND .7 (GAUZE/BANDAGES/DRESSINGS) ×2
APPLIER CLIP 5 13 M/L LIGAMAX5 (MISCELLANEOUS)
APPLIER CLIP ROT 10 11.4 M/L (STAPLE)
APR CLP MED LRG 11.4X10 (STAPLE)
APR CLP MED LRG 5 ANG JAW (MISCELLANEOUS)
BLADE EXTENDED COATED 6.5IN (ELECTRODE) IMPLANT
BLADE HEX COATED 2.75 (ELECTRODE) IMPLANT
BLADE SURG SZ10 CARB STEEL (BLADE) ×2 IMPLANT
CANISTER SUCTION 2500CC (MISCELLANEOUS) IMPLANT
CANNULA ENDOPATH XCEL 11M (ENDOMECHANICALS) IMPLANT
CLIP APPLIE 5 13 M/L LIGAMAX5 (MISCELLANEOUS) IMPLANT
CLIP APPLIE ROT 10 11.4 M/L (STAPLE) IMPLANT
CLOTH BEACON ORANGE TIMEOUT ST (SAFETY) ×2 IMPLANT
COVER MAYO STAND STRL (DRAPES) ×2 IMPLANT
DECANTER SPIKE VIAL GLASS SM (MISCELLANEOUS) ×2 IMPLANT
DERMABOND ADVANCED (GAUZE/BANDAGES/DRESSINGS) ×2
DERMABOND ADVANCED .7 DNX12 (GAUZE/BANDAGES/DRESSINGS) ×2 IMPLANT
DRAPE LAPAROSCOPIC ABDOMINAL (DRAPES) ×2 IMPLANT
DRAPE WARM FLUID 44X44 (DRAPE) ×2 IMPLANT
ELECT REM PT RETURN 9FT ADLT (ELECTROSURGICAL) ×2
ELECTRODE REM PT RTRN 9FT ADLT (ELECTROSURGICAL) ×1 IMPLANT
FILTER SMOKE EVAC LAPAROSHD (FILTER) IMPLANT
GLOVE BIO SURGEON STRL SZ7 (GLOVE) ×2 IMPLANT
GLOVE BIO SURGEON STRL SZ7.5 (GLOVE) ×2 IMPLANT
GLOVE BIOGEL M 7.0 STRL (GLOVE) ×2 IMPLANT
GLOVE BIOGEL M STRL SZ7.5 (GLOVE) IMPLANT
GLOVE BIOGEL PI IND STRL 7.0 (GLOVE) ×1 IMPLANT
GLOVE BIOGEL PI INDICATOR 7.0 (GLOVE) ×1
GLOVE INDICATOR 8.0 STRL GRN (GLOVE) ×2 IMPLANT
GOWN PREVENTION PLUS LG XLONG (DISPOSABLE) ×2 IMPLANT
GOWN PREVENTION PLUS XLARGE (GOWN DISPOSABLE) ×2 IMPLANT
GOWN STRL NON-REIN LRG LVL3 (GOWN DISPOSABLE) ×2 IMPLANT
GOWN STRL REIN XL XLG (GOWN DISPOSABLE) ×4 IMPLANT
KIT BASIN OR (CUSTOM PROCEDURE TRAY) ×2 IMPLANT
LIGASURE IMPACT 36 18CM CVD LR (INSTRUMENTS) IMPLANT
NS IRRIG 1000ML POUR BTL (IV SOLUTION) ×2 IMPLANT
PENCIL BUTTON HOLSTER BLD 10FT (ELECTRODE) IMPLANT
SCALPEL HARMONIC ACE (MISCELLANEOUS) IMPLANT
SET IRRIG TUBING LAPAROSCOPIC (IRRIGATION / IRRIGATOR) IMPLANT
SOLUTION ANTI FOG 6CC (MISCELLANEOUS) ×2 IMPLANT
SPONGE GAUZE 4X4 12PLY (GAUZE/BANDAGES/DRESSINGS) ×2 IMPLANT
SPONGE LAP 18X18 X RAY DECT (DISPOSABLE) IMPLANT
STAPLER VISISTAT 35W (STAPLE) ×2 IMPLANT
STRIP CLOSURE SKIN 1/2X4 (GAUZE/BANDAGES/DRESSINGS) IMPLANT
SUCTION POOLE TIP (SUCTIONS) IMPLANT
SUT PDS AB 1 TP1 96 (SUTURE) IMPLANT
SUT PROLENE 2 0 KS (SUTURE) IMPLANT
SUT PROLENE 2 0 SH DA (SUTURE) IMPLANT
SUT SILK 2 0 (SUTURE) ×1
SUT SILK 2 0 SH CR/8 (SUTURE) ×2 IMPLANT
SUT SILK 2-0 18XBRD TIE 12 (SUTURE) ×1 IMPLANT
SUT SILK 3 0 (SUTURE)
SUT SILK 3 0 SH CR/8 (SUTURE) IMPLANT
SUT SILK 3-0 18XBRD TIE 12 (SUTURE) IMPLANT
SYS LAPSCP GELPORT 120MM (MISCELLANEOUS)
SYSTEM LAPSCP GELPORT 120MM (MISCELLANEOUS) IMPLANT
TOWEL OR 17X26 10 PK STRL BLUE (TOWEL DISPOSABLE) ×2 IMPLANT
TRAY FOLEY CATH 14FRSI W/METER (CATHETERS) ×2 IMPLANT
TRAY LAP CHOLE (CUSTOM PROCEDURE TRAY) ×2 IMPLANT
TROCAR BLADELESS OPT 5 75 (ENDOMECHANICALS) ×2 IMPLANT
TROCAR XCEL 12X100 BLDLESS (ENDOMECHANICALS) IMPLANT
TROCAR XCEL BLUNT TIP 100MML (ENDOMECHANICALS) ×2 IMPLANT
TROCAR XCEL NON-BLD 11X100MML (ENDOMECHANICALS) IMPLANT
TUBING INSUFFLATION 10FT LAP (TUBING) ×2 IMPLANT
YANKAUER SUCT BULB TIP 10FT TU (MISCELLANEOUS) IMPLANT
YANKAUER SUCT BULB TIP NO VENT (SUCTIONS) IMPLANT

## 2012-08-04 NOTE — H&P (View-Only) (Signed)
Patient ID: [name], female   DOB: 1966-06-06, 47 y.o.   MRN: [id number]  Chief Complaint  Patient presents with  . Abdominal Pain    HPI [name] is a 47 y.o. female.  HPI 47 yo WF referred by Dr Nicholaus Bloom for evaluation of chronic lower abdominal pain. The patient is accompanied by her husband today. The patient states that she has had daily persistent lower abdominal pain since May 2013. She states that she has seen numerous specialists and no one can find a diagnosis or etiology for her lower abdominal pain. She describes her pain as a knife stabbing her. It radiates to her back and down both thighs. It is also a burning and stinging discomfort. She states that her symptoms began about 6 months after having her last ovary removed. She states that her ovary was removed because it had a large cyst in it. She states the quality of her pain is the same intensity as it was in May; however, her functional status has significantly worsened over the past 2 or 3 months. She states that she is not sleeping as well. She states that walking makes the pain worse. She states that she cannot do her daily activities because of the discomfort. She also states that over the past several weeks she has started to experience worsening bladder control. She states that she is incontinent of urine. She also states that she has a significant difficulty in starting her stream. She states that she has to significantly bear down to start her stream and it causes a lot of pain when she does so. She has been treated for numerous UTIs over the past year. Her most recent urinalysis showed more than 100,000 lactobacillus. She is scheduled to have urodynamics done at a urology office in [place] in the next few weeks.  She has seen numerous physicians for her lower abdominal pain and discomfort. She underwent a colonoscopy in the summer which revealed lymphocytic colitis. She states that she took budenoside for 3  months Without much change or improvement in her symptoms. She states that she was switched to a different medication, Uceris, by different gastroenterologist and took that for about 6 months without much change in her symptoms as well. During this time she has had loose stools. She states that she has had about 10 years history of diarrhea where she would have 4-6 loose watery bowel movements a day. This persisted when her abdominal pain started. However when she started to be placed on narcotics for her abdominal pain she is now having one loose watery stool about every other day. She does endorse some bloating. She denies any melena or hematochezia. She states that her appetite is decreased however she has gained approximately 30 pounds since all this started. She denies any heavy NSAID use. She denies any nausea. However she states when she gets severe pain it will make her vomit. She states that she has had numerous CT scans as well as an MRI over the past several months to try to find the cause of her pain. She states that she has had numerous lab work done which is all been negative. She was tested for arsenic, lead and other heavy metals which were negative. She had a GI pathogen profile done at the end of January which was all negative. She said that she tested negative for gluten hypersensitivity. She had an MRI of her back That showed central disc herniation at T8-9 that effaces the subarachnoid  space but there is no evidence of cord compression. She states that she was seen by 2 different neurologists. She was told that they did not think that her pain was neuropathic in origin. She also received injections to her back which did not help with her abdominal pain. She was recently saw Dr. Marice Potter, an OB/GYN, in consideration for possible  Endometriosis as an explanation for her pain.  She is also seeing Dr. Jordan Likes at the pain management clinic.   Past Medical History  Diagnosis Date  . Urinary calculus  or stone   . Gastric ulcer   . Cancer     cervical  . MVP (mitral valve prolapse)   . IBS (irritable bowel syndrome)     Past Surgical History  Procedure Laterality Date  . Appendectomy  2005  . Partial hysterectomy  2005  . Tubal ligation  2000  . Tonsillectomy  30 years ago  . Dilation and curettage of uterus      x 4  one after each SAB  . Bilateral oophorectomy  2012  . Exploratory laparotomy  2007    Family History  Problem Relation Age of Onset  . Heart attack Mother   . Cancer Father     tongue  . Cirrhosis Father   . Cirrhosis Mother     Social History History  Substance Use Topics  . Smoking status: Current Every Day Smoker -- 0.50 packs/day for 20 years    Types: Cigarettes  . Smokeless tobacco: Never Used  . Alcohol Use: 1.0 oz/week    2 drink(s) per week     Comment: per week    No Known Allergies  Current Outpatient Prescriptions  Medication Sig Dispense Refill  . ALPRAZolam (XANAX) 0.5 MG tablet Take 1 tablet (0.5 mg total) by mouth at bedtime as needed for sleep.  30 tablet  0  . atenolol (TENORMIN) 25 MG tablet Take 25 mg by mouth daily.      . citalopram (CELEXA) 20 MG tablet Take 1 tablet (20 mg total) by mouth daily.  90 tablet  1  . estradiol (ESTRACE) 1 MG tablet Take 1 mg by mouth daily.      . fentaNYL (DURAGESIC - DOSED MCG/HR) 50 MCG/HR Place 1 patch onto the skin every 3 (three) days.      [name] ibuprofen (ADVIL,MOTRIN) 800 MG tablet Take 800 mg by mouth every 8 (eight) hours as needed.        . lidocaine (LIDODERM) 5 %       . oxyCODONE-acetaminophen (PERCOCET/ROXICET) 5-325 MG per tablet Take 1 tablet by mouth every 6 (six) hours as needed for pain.  10 tablet  0   No current facility-administered medications for this visit.    Review of Systems Review of Systems  Constitutional: Positive for activity change and appetite change. Negative for fever and unexpected weight change.  HENT: Negative for hearing loss, nosebleeds and neck  pain.   Eyes: Negative for photophobia and visual disturbance.  Respiratory: Negative for apnea, chest tightness and shortness of breath.   Cardiovascular: Negative for chest pain, palpitations and leg swelling.  Gastrointestinal: Positive for nausea, abdominal pain and diarrhea. Negative for vomiting, blood in stool and rectal pain.  Genitourinary: Positive for frequency, difficulty urinating and pelvic pain. Negative for decreased urine volume.  Musculoskeletal: Negative for joint swelling and gait problem.       Low back pain, b/l hip pain  Allergic/Immunologic: Negative for food allergies.  Neurological: Positive for  headaches. Negative for dizziness, tremors, seizures, syncope, light-headedness and numbness.  Hematological: Negative for adenopathy. Does not bruise/bleed easily.  Psychiatric/Behavioral: Negative for behavioral problems.    Blood pressure 100/70, pulse 64, resp. rate 14, height 5\' 6"  (1.676 m), weight 187 lb (84.823 kg).  Physical Exam Physical Exam  Vitals reviewed. Constitutional: She is oriented to person, place, and time. She appears well-developed and well-nourished. No distress.  HENT:  Head: Normocephalic and atraumatic.  Right Ear: External ear normal.  Left Ear: External ear normal.  Eyes: Conjunctivae are normal. No scleral icterus.  Neck: Normal range of motion. Neck supple. No tracheal deviation present. No thyromegaly present.  Cardiovascular: Normal rate, regular rhythm and normal heart sounds.   Pulmonary/Chest: Effort normal and breath sounds normal. No respiratory distress. She has no wheezes. She has no rales.  Abdominal: Soft. Bowel sounds are normal. She exhibits no distension. There is tenderness in the right lower quadrant, suprapubic area and left lower quadrant. There is no rigidity, no rebound and no guarding. No hernia.  Well healed scattered trocar sites; mainly tender to light palpation in suprapubic/infraumbilical area, less so in LLQ/RLQ   Musculoskeletal: She exhibits no edema and no tenderness.  Neurological: She is alert and oriented to person, place, and time.  Skin: Skin is warm and dry. No rash noted. She is not diaphoretic. No erythema.  Psychiatric: She has a normal mood and affect. Judgment and thought content normal.  Tearful at times    Data Reviewed Dr Ellin Saba note 07/20/12 Dr Hommel's note 1/23 [place] note 1/6, 11/22 Dr Shelah Lewandowsky note 05/25/12 Dr Estanislado Spire note at [facility] 06/20/12 Dr Cyndia Diver (pain clinic) note 06/15/12 Dr Tania Ade' notes at [facility] 11/19/11, 01/2012 Colonoscopy report and path - lymphocytic colitis Labs from 07/13/12 - nml CMET, CBC, TSH Normal GI Pathogen Profile from [facility] 07/13/12  CT abd/pelvis  MRI abd/pelvis  Assessment    Chronic lower abdominal pain Dysuria Urinary incontinence     Plan    I had a very extensive and prolonged discussion with the patient and her husband. I explained to him that I reviewed all the meds listed above as well as the CT scan of her abdomen pelvis, the MRI report, as well as her colonoscopy. I was quite up front with the patient and her husband that I do not have an explanation for her lower abdominal pain which radiates to both hips and down her thighs. Moreover I am not sure as to the etiology of her weak stream as well as pain with urination. I am not sure if the 2 are related. In addition I am not sure that there is a functional GI reason for her pain. They are concerned that she may have adhesive disease from her prior surgeries. I explained to them that in my experience I have never seen  Abdominal or pelvic adhesive disease cause pain to radiate to both thighs. The husband and the patient inquired about abdominal exploration.  I explained that very few rare surgical reports of performing an ileostomy for persistent lymphocytic colitis. However I am not sure that lymphocytic colitis and diversion of  her fecal stream would have any significant improvement with the constellation of her symptoms.  We also discussed that since it appears that she has only had laparoscopic procedures in the past that I doubt she has significant abdominal pelvic adhesive disease.  Nonetheless, the patient and her husband are very frustrated that no explanation can be found  for her pain and that she cannot find any source of improvement for it. Of note she did state that the Neurontin helped with her abdominal pain but it made her "drunk ".  I essentially had 2 options for the patient. We discussed a diagnostic laparoscopy versus referral to a large academic tertiary Medical Center. With respect to diagnostic laparoscopy, We discussed the risk and benefits of surgery including but not limited to bleeding, infection, injury to surrounding structures, need to convert to an open procedure, enterotomy, blood clot formation, anesthesia concerns, ileus, hernia formation, and failure to ameliorate her symptoms. We also discussed the typical postoperative course. I explained that there was a low likelihood that I would find anything of significance on a diagnostic laparoscopy to explain her pain. I did discuss with the patient and her husband that if I did find something unusual and that if I didn't feel qualified handling it She may have to have an additional procedure in the future by a different surgeon. We discussed the possibility of lysing adhesions if any were found. I do not think it is unreasonable to offer her a diagnostic laparoscopy since she has had such an exhaustive workup and evaluation without a clear source for her discomfort; however, I was completely realistic with the patient and her husband about the possibility of this being a negative exploration. The husband also asked if we could evaluate the bladder at the time of surgery. I explained that we did look at the outside of the bladder however the functional  aspects of how the bladder functions is best determined by the urodynamic studies she is scheduled to get at the end of February  The patient and her husband have decided to proceed with a diagnostic laparoscopy. We will have to contact Dr. Cyndia Diver office to discuss her postoperative pain management since she is under a pain management contract with him.  Mary Sella. Andrey Campanile, MD, FACS General, Bariatric, & Minimally Invasive Surgery [facility], [place]        [facility] M 07/26/2012, 11:07 AM

## 2012-08-04 NOTE — Anesthesia Postprocedure Evaluation (Signed)
Anesthesia Post Note  Patient: Sport and exercise psychologist  Procedure(s) Performed: Procedure(s) (LRB): LAPAROSCOPY DIAGNOSTIC (N/A)  Anesthesia type: General  Patient location: PACU  Post pain: Pain level controlled  Post assessment: Post-op Vital signs reviewed  Last Vitals:  Filed Vitals:   08/04/12 1100  BP: 99/59  Pulse: 46  Temp:   Resp: 14    Post vital signs: Reviewed  Level of consciousness: sedated  Complications: No apparent anesthesia complications

## 2012-08-04 NOTE — Brief Op Note (Signed)
08/04/2012  10:09 AM  PATIENT:  [name]  47 y.o. female  PRE-OPERATIVE DIAGNOSIS:  chronic lower abdominal pain  POST-OPERATIVE DIAGNOSIS:  chronic lower abdominal pain  PROCEDURE:  Procedure(s): LAPAROSCOPY DIAGNOSTIC (N/A)  SURGEON:  Surgeon(s) and Role:    * Atilano Ina, MD,FACS - Primary  PHYSICIAN ASSISTANT: none  ASSISTANTS: none   ANESTHESIA:   general  EBL:  Total I/O In: 1100 [I.V.:1100] Out: 10 [Blood:10]  BLOOD ADMINISTERED:none  DRAINS: none   LOCAL MEDICATIONS USED:  MARCAINE     SPECIMEN:  Source of Specimen:  small surgical staple  FINDINGS: negative diagnostic laparoscopy  DISPOSITION OF SPECIMEN:  PATHOLOGY  COUNTS:  YES  TOURNIQUET:  * No tourniquets in log *  DICTATION: .Other Dictation: Dictation Number [id number]  PLAN OF CARE: Discharge to home after PACU  PATIENT DISPOSITION:  PACU - hemodynamically stable.   Delay start of Pharmacological VTE agent (>24hrs) due to surgical blood loss or risk of bleeding: not applicable  Mary Sella. Andrey Campanile, MD, FACS General, Bariatric, & Minimally Invasive Surgery [facility], [place]

## 2012-08-04 NOTE — Op Note (Signed)
NAMECORYNNE, [name]              ACCOUNT NO.:  [id number]  MEDICAL RECORD NO.:  [id number]  LOCATION:  [facility]                         FACILITY:  [facility]  PHYSICIAN:  Mary Sella. Andrey Campanile, MD, FACSDATE OF BIRTH:  1965/07/28  DATE OF PROCEDURE: DATE OF DISCHARGE:  08/04/2012                              OPERATIVE REPORT   PREOPERATIVE DIAGNOSIS:  Chronic lower abdominal pain.  POSTOPERATIVE DIAGNOSIS:  Chronic lower abdominal pain.[name]  PROCEDURE:  Diagnostic laparoscopy.  SURGEON:  Mary Sella. Andrey Campanile, MD, FACS.  ANESTHESIA:  General plus local consisting 0.25% Marcaine with epi.  SPECIMEN:  Small surgical staple.  EBL:  Minimal.  INDICATIONS FOR SURGERY:  The patient is a very pleasant 47 year old Caucasian female who has had persistent lower abdominal pain radiating to her sides of lower back and down her lateral thighs for at least year and a half.  She has undergone an exhaustive workup including colonoscopy, CT scan, MRI as well as an extensive lab workup all of which has been unremarkable.  She was found to have evidence of lymphocytic colitis on her colonoscopy.  She was also concerned for intra-abdominal adhesions as well as abdominal hernia.  I did not appreciate abdominal hernial exam.  My suspicion for intra- abdominal adhesions was low considering the majority of her procedures had been done laparoscopic.  We discussed the risks and benefits of surgery including, but not limited to, bleeding, infection, injury to surrounding structures, blood clot formation, ileus, hernia formation, scarring, anesthesia risks as well as failure to find anything to explain her pain.  I explained to her that my suspicion of finding anything clinical significance was low.  However, the patient elected to proceed to the operating room.  DESCRIPTION OF PROCEDURE:  After obtaining informed consent, she was taken to the operating room 1 at [facility].  General endotracheal  anesthesia was established.  Sequential compression devices were placed.  Foley catheter was placed.  Her arms were tucked at her sides with appropriate padding.  Her abdomen was prepped and draped in usual standard surgical fashion with ChloraPrep.  She received IV antibiotics prior to skin incision.  She also received IV Tylenol. Local was infiltrated just through her old transverse infraumbilical incision.  I then incised the skin through her old scar.  The fascia was grasped and lifted anteriorly.  She had some old fascial sutures, which appeared either to be PDS or Ethibond.  The fascia was incised and abdominal cavity was entered.  Pursestring sutures placed around the fascia with a 0 Vicryl on UR6 needle.  A 12 mm Hasson trocar was introduced and pneumoperitoneum smoothly established up to the patient pressure of 15 mmHg.  The laparoscope was advanced and the abdominal cavity was surveilled. There was no evidence of injury to surrounding structures.  I inspected the liver both the left and right lobe of the liver, both the anterior and posterior.  After I placed two 5 mm trocars, both on the left side of the abdomen in the midclavicular line, 1 in the upper quadrant, 1 in the lower quadrant all under direct visualization.  The liver appeared normal.  There were violin strings between the liver surface and  the abdominal wall consistent with PID.  The gallbladder appeared normal. It had 1 or 2 simple omental adhesions to it.  The gallbladder did not appear thickened or inflamed.  The stomach appeared grossly normal.  We then had the nurse anesthetist placed the patient in Trendelenburg position.  The small bowel was reflected up out of the pelvis and the sigmoid colon was grasped and lifted up out of the pelvis.  Her pelvis looked normal to me.  There is obvious postoperative changes from her hysterectomy.  The Foley catheter bulb was visualized.  There is a 1-2 mm surgical stapler  embedded in the peritoneum over the bladder.  There did not appear to be any active endometriosis that I could appreciate. At this point, we turned our attention to running the bowel.  The cecum and terminal ileum were visualized.  I appreciated her surgical scar from her appendectomy.  We then ran the bowel starting at the terminal ileum proximally.  I ran it in its entirety from the terminal ileum all the way to the ligament of Treitz using atraumatic bowel graspers.  The patient was airplaned and rotated in order to accommodate this maneuver.  The small bowel appeared normal.  There was no encroaching fat on the bowel.  The mesentery was nice and healthy appearing, there were no intra-abdominal adhesions or adhesions between the loops of bowel. The bowel did not appear grossly dilated and the ligament Treitz appeared normal.  At this point, I inspected the transverse colon and the omentum.  The transverse colon appeared normal.  I ran it back to the hepatic flexure and appeared grossly normal.  The colon had a little stool burden.  I then looked at the right colon and again, it appeared normal as well.  I then visualized the anterior abdominal wall on the right side.  There was no evidence of fascial defect on the right side or in the upper midline.  The laparoscope was then placed in the left lower quadrant trocar and the upper abdominal wall was inspected.  The patient had complained of what she appreciated to be fascial defect in her left upper quadrant underneath the 5 mm trocar site from her prior surgery most likely, her prior Nissen fundoplication, but on physical exam, I did not appreciate a hernia and on laparoscopy, there was no evidence of fascial defect.  A picture was taken of the site.  I then looked at the sigmoid colon, the left colon and again appeared normal. She had a normal lateral attachments of the sigmoid colon to the lateral abdominal wall.  Again, the sigmoid  colon, other parts of the colon appeared normal.  I then reinspected the pelvic area.  There was no bowel adhered to her pelvis.  The bowel was quite easily retracted and manipulated and her pelvis appeared grossly normal except for the typical postoperative changes after hysterectomy.  The laparoscope was then placed back in the left lower quadrant trocar site.  The Hasson trocar was removed and the previously placed pursestring suture was tied down thus obliterating the fascial defect.  nothing was called within our fascial closure.  It was air tight.  Additional local was infiltrated in the preperitoneal space.  Pneumoperitoneum was released and the remaining 2 trocars were removed.  Skin incisions were closed with a 4-0 Monocryl in a subcuticular fashion.  Dermabond was applied to the skin. The patient was extubated after a Foley catheter was removed.  All needle, instrument, sponge counts were  correct x2.  There were no immediate complications.  The patient was taken to recovery in stable condition.     Mary Sella. Andrey Campanile, MD, FACS     EMW/MEDQ  D:  08/04/2012  T:  08/04/2012  Job:  [id number]  cc:   Lowell Bouton, M.D. Fax: [phone]  Ardell Isaacs, MD Fax: [phone]  Belva Agee, NP Fax: [phone]  Eldridge Abrahams, DO [place]

## 2012-08-04 NOTE — Preoperative (Signed)
Beta Blockers   Reason not to administer Beta Blockers:Not Applicable 

## 2012-08-04 NOTE — Interval H&P Note (Signed)
History and Physical Interval Note:  08/04/2012 8:36 AM  [name]  has presented today for surgery, with the diagnosis of chronic lower abdominal pain  The various methods of treatment have been discussed with the patient and family. After consideration of risks, benefits and other options for treatment, the patient has consented to  Procedure(s): LAPAROSCOPY DIAGNOSTIC (N/A) as a surgical intervention .  The patient's history has been reviewed, patient examined, no change in status, stable for surgery.  I have reviewed the patient's chart and labs.  Questions were answered to the patient's satisfaction.    Mary Sella. Andrey Campanile, MD, FACS General, Bariatric, & Minimally Invasive Surgery [facility], [place]  [facility] M

## 2012-08-04 NOTE — Transfer of Care (Signed)
Immediate Anesthesia Transfer of Care Note  Patient: [name]  Procedure(s) Performed: Procedure(s): LAPAROSCOPY DIAGNOSTIC (N/A)  Patient Location: PACU  Anesthesia Type:General  Level of Consciousness: awake, alert , oriented and patient cooperative  Airway & Oxygen Therapy: Patient Spontanous Breathing and Patient connected to face mask oxygen  Post-op Assessment: Report given to PACU RN and Post -op Vital signs reviewed and stable  Post vital signs: Reviewed and stable  Complications: No apparent anesthesia complications

## 2012-08-07 ENCOUNTER — Encounter (HOSPITAL_COMMUNITY): Payer: Self-pay | Admitting: General Surgery

## 2012-08-09 ENCOUNTER — Telehealth: Payer: Self-pay | Admitting: Physician Assistant

## 2012-08-09 NOTE — Telephone Encounter (Signed)
Please call pt and let him know I do not see where I order liver ELISA. Call GI and see if was ordered if not we can get ordered.

## 2012-08-15 NOTE — Telephone Encounter (Addendum)
Called pt and she is not going to GI she did however go to a Development worker, international aid and she is thinking that this is who ordered the test. I told her that I would relay this to [place].Loralee Pacas [place]

## 2012-08-21 ENCOUNTER — Telehealth: Payer: Self-pay | Admitting: *Deleted

## 2012-08-21 NOTE — Telephone Encounter (Signed)
Opened in error

## 2012-08-21 NOTE — Telephone Encounter (Signed)
Lab was called to look for code to order for liver fluke blood work. Pt sent paperwork will continue to look for appropriate test.

## 2012-08-25 ENCOUNTER — Encounter (INDEPENDENT_AMBULATORY_CARE_PROVIDER_SITE_OTHER): Payer: BC Managed Care – PPO | Admitting: General Surgery

## 2012-09-13 ENCOUNTER — Telehealth: Payer: Self-pay | Admitting: Physician Assistant

## 2012-09-13 NOTE — Telephone Encounter (Signed)
Call pt: Husband left msg to check liver enzymes. They were checked 5 months ago and normal.

## 2012-09-14 NOTE — Telephone Encounter (Signed)
Patient's husband notified  

## 2012-09-22 ENCOUNTER — Encounter (INDEPENDENT_AMBULATORY_CARE_PROVIDER_SITE_OTHER): Payer: Self-pay

## 2012-09-25 ENCOUNTER — Encounter: Payer: Self-pay | Admitting: Physician Assistant

## 2012-09-25 ENCOUNTER — Ambulatory Visit (INDEPENDENT_AMBULATORY_CARE_PROVIDER_SITE_OTHER): Payer: BC Managed Care – PPO | Admitting: Physician Assistant

## 2012-09-25 VITALS — BP 102/69 | HR 63 | Wt 173.0 lb

## 2012-09-25 DIAGNOSIS — R1013 Epigastric pain: Secondary | ICD-10-CM

## 2012-09-25 DIAGNOSIS — R319 Hematuria, unspecified: Secondary | ICD-10-CM

## 2012-09-25 DIAGNOSIS — H43399 Other vitreous opacities, unspecified eye: Secondary | ICD-10-CM

## 2012-09-25 DIAGNOSIS — M545 Low back pain, unspecified: Secondary | ICD-10-CM

## 2012-09-25 DIAGNOSIS — H43393 Other vitreous opacities, bilateral: Secondary | ICD-10-CM

## 2012-09-25 LAB — POCT URINALYSIS DIPSTICK
Glucose, UA: NEGATIVE
Spec Grav, UA: >=1.03
Urobilinogen, UA: 1

## 2012-09-25 MED ORDER — SUCRALFATE 1 G PO TABS: 1.0000 g | ORAL_TABLET | Freq: Four times a day (QID) | ORAL | Status: DC

## 2012-09-25 MED ORDER — LIDOCAINE 5 % EX PTCH: 1.0000 | MEDICATED_PATCH | Freq: Every day | CUTANEOUS | Status: DC | PRN

## 2012-09-25 MED ORDER — PANTOPRAZOLE SODIUM 40 MG PO TBEC: 40.0000 mg | DELAYED_RELEASE_TABLET | Freq: Every day | ORAL | Status: DC

## 2012-09-25 MED ORDER — GI COCKTAIL ~~LOC~~
30.0000 mL | Freq: Once | ORAL | Status: AC
  Administered 2012-09-25: 30 mL via ORAL

## 2012-09-25 NOTE — Patient Instructions (Signed)
Will send urine off for culture.  Will call with labs.  Start protonix daily, start carafate for the next 1-2 weeks.

## 2012-09-25 NOTE — Progress Notes (Signed)
  Subjective:    Patient ID: [name], female    DOB: October 09, 1965, 47 y.o.   MRN: [id number]  HPI Patient presents to the clinic with epigastric pain, hematuria.   Epigastric pain- STarted about 1 month ago worse in last week. Cannot eat or drink anything without significant pain and burning. Not drinking any alcohol or caffiene products. Worse with eating and drinking. Denies any NSAIDs by mouth. Not on PPI. Not tried anything to make better.   Hematuria- ongoing problem. Sent to urology for work up. Was negative. No pain with urination. Increased frequency is ongoing. myrbetriq helps.   Chronic pain syndrome- ongoing, uncontrolled. Being seen by pain management. Wants refill on lidoderm patches.       Review of Systems     Objective:   Physical Exam  Constitutional: She is oriented to person, place, and time. She appears well-developed and well-nourished.  HENT:  Head: Normocephalic and atraumatic.  Cardiovascular: Normal rate, regular rhythm and normal heart sounds.   Pulmonary/Chest: Effort normal and breath sounds normal.  No CVA tenderness.   Abdominal: Soft. Bowel sounds are normal. She exhibits no distension.  Epigastric tenderness to palpation.   Extreme lower ongoing abdominal tenderness.   Neurological: She is alert and oriented to person, place, and time.  Skin: Skin is warm and dry.  Psychiatric: She has a normal mood and affect. Her behavior is normal.          Assessment & Plan:  Epigastric pain- suspect ulcer/acid reflux. gave GI cocktail in office and pt experience a lot of benefit. Will test for h.pylori. Started on protonix and carafate. No NSIADS by mouth. Should start to improve can follow up with GI if not improving.   Chronic pain syndrome- Refilled lidoderm patches. Pain clinic managing the rest.   Hematuria- UA positive for blood no leuks or nitrates. Will send for culture. Discussed with pt this has been ongoing. She is seeing urology and they  have done work up. Will make sure no infection.  Pt request liver enzymes to be checked.

## 2012-09-26 LAB — COMPREHENSIVE METABOLIC PANEL
ALT: 19 U/L (ref 0–35)
AST: 24 U/L (ref 0–37)
CO2: 25 mEq/L (ref 19–32)
Chloride: 106 mEq/L (ref 96–112)
Potassium: 4.4 mEq/L (ref 3.5–5.3)
Total Protein: 6.4 g/dL (ref 6.0–8.3)

## 2012-09-26 LAB — H. PYLORI ANTIBODY, IGG: H Pylori IgG: 0.65 {ISR}

## 2012-09-26 MED ORDER — MIRABEGRON ER 50 MG PO TB24: 50.0000 mg | ORAL_TABLET | Freq: Every day | ORAL | Status: DC

## 2012-09-27 LAB — URINE CULTURE
Colony Count: NO GROWTH
Organism ID, Bacteria: NO GROWTH

## 2012-10-02 ENCOUNTER — Telehealth: Payer: Self-pay | Admitting: Physician Assistant

## 2012-10-02 DIAGNOSIS — G8929 Other chronic pain: Secondary | ICD-10-CM

## 2012-10-02 DIAGNOSIS — R109 Unspecified abdominal pain: Secondary | ICD-10-CM

## 2012-10-02 NOTE — Telephone Encounter (Signed)
Pt called and would like referral to [facility] for testing. Will refer.

## 2012-10-04 ENCOUNTER — Encounter: Payer: Self-pay | Admitting: Physician Assistant

## 2012-10-04 ENCOUNTER — Ambulatory Visit (INDEPENDENT_AMBULATORY_CARE_PROVIDER_SITE_OTHER): Payer: BC Managed Care – PPO | Admitting: Physician Assistant

## 2012-10-04 ENCOUNTER — Ambulatory Visit (INDEPENDENT_AMBULATORY_CARE_PROVIDER_SITE_OTHER): Payer: BC Managed Care – PPO

## 2012-10-04 VITALS — BP 100/56 | HR 74 | Wt 171.0 lb

## 2012-10-04 DIAGNOSIS — N390 Urinary tract infection, site not specified: Secondary | ICD-10-CM

## 2012-10-04 DIAGNOSIS — R109 Unspecified abdominal pain: Secondary | ICD-10-CM

## 2012-10-04 DIAGNOSIS — R309 Painful micturition, unspecified: Secondary | ICD-10-CM

## 2012-10-04 DIAGNOSIS — K5900 Constipation, unspecified: Secondary | ICD-10-CM

## 2012-10-04 DIAGNOSIS — N23 Unspecified renal colic: Secondary | ICD-10-CM

## 2012-10-04 LAB — POCT URINALYSIS DIPSTICK
Glucose, UA: NEGATIVE
Spec Grav, UA: 1.02

## 2012-10-04 MED ORDER — NITROFURANTOIN MONOHYD MACRO 100 MG PO CAPS: ORAL_CAPSULE | ORAL | Status: DC

## 2012-10-04 MED ORDER — CIPROFLOXACIN HCL 500 MG PO TABS: 500.0000 mg | ORAL_TABLET | Freq: Two times a day (BID) | ORAL | Status: DC

## 2012-10-04 NOTE — Patient Instructions (Addendum)
Start cipro for 7 days and then after start taking daily macrobid daily.   Miralax 1-2 capfuls daily. (Linzess/Amitiza)

## 2012-10-06 ENCOUNTER — Telehealth: Payer: Self-pay | Admitting: *Deleted

## 2012-10-06 LAB — URINALYSIS, ROUTINE W REFLEX MICROSCOPIC
Glucose, UA: NEGATIVE mg/dL
Nitrite: NEGATIVE
Specific Gravity, Urine: 1.017 (ref 1.005–1.030)
pH: 6.5 (ref 5.0–8.0)

## 2012-10-06 LAB — URINALYSIS, MICROSCOPIC ONLY

## 2012-10-06 NOTE — Telephone Encounter (Signed)
I really cannot because you are on other pain meds and not always sure of interaction. Please call pain management today and see if they can adjust.

## 2012-10-06 NOTE — Progress Notes (Signed)
  Subjective:    Patient ID: [name], female    DOB: 1966-04-07, 47 y.o.   MRN: [id number]  HPI Patient presents to the clinic for urinary tract infection symptoms. She came in last week and urine dipstick was done as well as culture and all was negative. She continues to have urinary urgency, frequency, and blood in her urine. She has seen a urologist and nothing has come from work ups. She continues to have chronic pain in her abdomen to which she sees pain clinic. No fever, chills, muscle aches. Her lower back continues to ache with every urination. Pain is characterized at 7/10.     Review of Systems     Objective:   Physical Exam  Constitutional: She appears well-developed and well-nourished.  HENT:  Head: Normocephalic and atraumatic.  Cardiovascular: Normal rate, regular rhythm and normal heart sounds.   Pulmonary/Chest: Effort normal and breath sounds normal.  Grimace to palpation around CVA.   Abdominal: Soft. She exhibits distension. There is tenderness. There is guarding.  Hypoactive bowel sounds. Lower abdominal tenderness(this is ongoing).  Psychiatric: She has a normal mood and affect. Her behavior is normal.          Assessment & Plan:  UTI/Constipation/Chronic pain- UA trace leuks, moderate blood and no nitrates. Changed since last visit. Did treat today for infection. Abdominal x-ray was negative for stones but positive for stool. After stopping treatment medication did start pt on daily macrobid to see if frequency of UTI change. Cannot completely rule out Intersitial Cystitis and would like from patient to make appt with urologist. Discuss constipation and pt not taking anything regularly. Suggested miralax 1-2 capfuls daily and see if there is any relief if not could consider daily medication Linzess.

## 2012-10-06 NOTE — Telephone Encounter (Signed)
Pt.notified

## 2012-10-06 NOTE — Telephone Encounter (Signed)
Pt calls today & states that she is "beside herself with pain".  She states that she is taking the abx for her UTI but that ever her phentanol patch isn't even helping.  She wants to know if you can call her something in for pain.  Please advise

## 2012-10-07 LAB — URINE CULTURE

## 2012-10-09 ENCOUNTER — Telehealth: Payer: Self-pay | Admitting: Physician Assistant

## 2012-10-09 NOTE — Telephone Encounter (Signed)
Culture shows sensitive to medication. Follow up with urology.

## 2012-10-09 NOTE — Telephone Encounter (Signed)
Patient's husband states that she has not had an endoscopy.  He also states that she isn't eating because of being in pain. She has an appt with pain management the Wed to see if they can increase her phentonel patch.

## 2012-10-09 NOTE — Telephone Encounter (Signed)
Call pt: I did look up information about GIST tumors. Has GI ever done a endoscopy? If not I certainly think that is reasonable. Otherwise you have had all the testing that are standard of care. Mention it to GI. They should have more experience with this type of tumor.

## 2012-10-18 ENCOUNTER — Telehealth: Payer: Self-pay | Admitting: Physician Assistant

## 2012-10-18 DIAGNOSIS — R1084 Generalized abdominal pain: Secondary | ICD-10-CM

## 2012-10-18 DIAGNOSIS — G894 Chronic pain syndrome: Secondary | ICD-10-CM

## 2012-10-18 NOTE — Telephone Encounter (Signed)
Pt request another referral for GI. Sent

## 2012-10-18 NOTE — Telephone Encounter (Signed)
Pt.notified

## 2012-10-18 NOTE — Telephone Encounter (Signed)
Patient's husband stopped by asking if the abx [name] is on would cause gum swelling.  Also if we could send a pt referral for bladder leaking & recurrent UTIs.  Please advise

## 2012-11-07 ENCOUNTER — Ambulatory Visit (INDEPENDENT_AMBULATORY_CARE_PROVIDER_SITE_OTHER): Payer: BC Managed Care – PPO | Admitting: Family Medicine

## 2012-11-07 ENCOUNTER — Encounter: Payer: Self-pay | Admitting: Family Medicine

## 2012-11-07 VITALS — BP 99/63 | HR 46 | Temp 97.6°F | Wt 160.0 lb

## 2012-11-07 DIAGNOSIS — N390 Urinary tract infection, site not specified: Secondary | ICD-10-CM

## 2012-11-07 DIAGNOSIS — R1013 Epigastric pain: Secondary | ICD-10-CM

## 2012-11-07 LAB — POCT URINALYSIS DIPSTICK
Spec Grav, UA: 1.02
Urobilinogen, UA: 4

## 2012-11-07 MED ORDER — SULFAMETHOXAZOLE-TRIMETHOPRIM 800-160 MG PO TABS: ORAL_TABLET | ORAL | Status: AC

## 2012-11-07 NOTE — Progress Notes (Signed)
CC: [name] is a 47 y.o. female is here for Abdominal Pain   Subjective: HPI:  Patient complains of 5 days acute on chronic abdominal pain localized low in the abdomen radiating to the lower back. Started as mild now progressed to severe. Worse with walking improves with rest or lying down. Associated with dysuria. Patient has trouble describing the character of pain. Has tried Azo-Standard without much improvement of her symptoms. She's been taking nitrofurantoin once a day for UTI prophylaxis. She's had some nausea but no vomiting. She denies fevers, chills, diarrhea, constipation, vaginal bleeding, nor flank pain.   Review Of Systems Outlined In HPI  Past Medical History  Diagnosis Date  . Urinary calculus or stone   . Gastric ulcer   . Cancer     cervical  . MVP (mitral valve prolapse)   . IBS (irritable bowel syndrome)   . Heart murmur   . Mitral valve prolapse   . Dysrhythmia     pvc   . Anxiety   . Urinary tract infection   . Headache      Family History  Problem Relation Age of Onset  . Heart attack Mother   . Cancer Father     tongue  . Cirrhosis Father   . Cirrhosis Mother      History  Substance Use Topics  . Smoking status: Current Every Day Smoker -- 0.25 packs/day for 20 years    Types: Cigarettes  . Smokeless tobacco: Never Used  . Alcohol Use: 1.0 oz/week    2 drink(s) per week     Comment: per week     Objective: Filed Vitals:   11/07/12 1503  BP: 99/63  Pulse: 46  Temp: 97.6 F (36.4 C)    Vital signs reviewed. General: Alert and Oriented, No Acute Distress HEENT: Pupils equal, round, reactive to light. Conjunctivae clear.  External ears unremarkable.  Moist mucous membranes. Lungs: Clear and comfortable work of breathing, speaking in full sentences without accessory muscle use. Abdominal: Patient defers examination Cardiac: Regular rate and rhythm.  Neuro: CN II-XII grossly intact, gait normal. Extremities: No peripheral edema.   Strong peripheral pulses.  Mental Status: No depression, anxiety, nor agitation. Logical though process. Skin: Warm and dry.  Assessment & Plan: [place] was seen today for abdominal pain.  Diagnoses and associated orders for this visit:  Abdominal pain, epigastric - Urinalysis Dipstick - Urine Culture  UTI (urinary tract infection) - sulfamethoxazole-trimethoprim (SEPTRA DS) 800-160 MG per tablet; One tab twice a day for five days.  Other Orders - fentaNYL (DURAGESIC - DOSED MCG/HR) 75 MCG/HR; Place 1 patch onto the skin every 3 (three) days.    Urinalysis highly suggestive of UTI, based on recent cultures I predict that Bactrim will be effective. Will follow culture and change prophylactic regimen based on sensitivities. Patient expresses mild interest in pelvic physical therapy as mother had recurrent UTIs relieved with pelvic physical therapy.  I will discuss this with her PCP  Return if symptoms worsen or fail to improve.

## 2012-11-08 ENCOUNTER — Telehealth: Payer: Self-pay | Admitting: Family Medicine

## 2012-11-08 NOTE — Telephone Encounter (Signed)
Pt's complete name was not on the specimen so lab cancelled it. But it was confirmed that the correct pt was with the correct specimen

## 2012-11-08 NOTE — Telephone Encounter (Signed)
Sue Lush, When you get a chance will you please call [facility] and let them know that I received a cancellation notification for Neera's urine culture however this still needs to be done, I did not cancel this order.

## 2012-11-09 ENCOUNTER — Encounter: Payer: Self-pay | Admitting: Internal Medicine

## 2012-11-09 LAB — URINE CULTURE
Colony Count: NO GROWTH
Organism ID, Bacteria: NO GROWTH

## 2012-11-13 ENCOUNTER — Encounter: Payer: Self-pay | Admitting: Internal Medicine

## 2012-11-13 ENCOUNTER — Ambulatory Visit (INDEPENDENT_AMBULATORY_CARE_PROVIDER_SITE_OTHER): Payer: BC Managed Care – PPO | Admitting: Family Medicine

## 2012-11-13 ENCOUNTER — Encounter: Payer: Self-pay | Admitting: Family Medicine

## 2012-11-13 ENCOUNTER — Ambulatory Visit (INDEPENDENT_AMBULATORY_CARE_PROVIDER_SITE_OTHER): Payer: BC Managed Care – PPO | Admitting: Internal Medicine

## 2012-11-13 VITALS — BP 88/60 | HR 60 | Ht 66.0 in | Wt 156.1 lb

## 2012-11-13 VITALS — BP 96/62 | HR 58 | Temp 97.3°F | Wt 156.0 lb

## 2012-11-13 DIAGNOSIS — R1031 Right lower quadrant pain: Secondary | ICD-10-CM

## 2012-11-13 DIAGNOSIS — R112 Nausea with vomiting, unspecified: Secondary | ICD-10-CM

## 2012-11-13 DIAGNOSIS — N949 Unspecified condition associated with female genital organs and menstrual cycle: Secondary | ICD-10-CM

## 2012-11-13 DIAGNOSIS — R109 Unspecified abdominal pain: Secondary | ICD-10-CM

## 2012-11-13 DIAGNOSIS — G8929 Other chronic pain: Secondary | ICD-10-CM

## 2012-11-13 DIAGNOSIS — R1013 Epigastric pain: Secondary | ICD-10-CM

## 2012-11-13 DIAGNOSIS — R102 Pelvic and perineal pain: Secondary | ICD-10-CM

## 2012-11-13 DIAGNOSIS — R1032 Left lower quadrant pain: Secondary | ICD-10-CM

## 2012-11-13 LAB — POCT URINALYSIS DIPSTICK
Glucose, UA: NEGATIVE
Nitrite, UA: NEGATIVE
Protein, UA: NEGATIVE
Urobilinogen, UA: 0.2
pH, UA: 6

## 2012-11-13 NOTE — Progress Notes (Signed)
Patient ID: [name], female   DOB: 08/17/1965, 47 y.o.   MRN: [id number] HPI: [name] is a 47 yo female with complicated PMH including but not limited to cervical cancer status post hysterectomy in 2007, ovarian cysts status post bilateral oophorectomy in December 2012, chronic lower abdominal and pelvic pain, overactive bladder, depression, lymphocytic colitis, nephrolithiasis, tobacco abuse, mitral valve prolapse who is seen in consultation at the request of Tandy Gaw, PA-C for evaluation of ongoing lower abdominal and pelvic pain and new epigastric pain. She is here today with her husband. She reports a complicated history and workup of lower abdominal pain which has been present since around late 09/15/2022 early April of 2013. For this she has had colonoscopy performed at [facility] by Dr. Tania Ade which she reports revealed lymphocytic colitis but was otherwise normal. She was told she had IBS. She did take budesonide for some time. She ended up seeing Dr. Lula Olszewski, another gastroenterologist in [place] who put her on Neurontin for her chronic pelvic pain. She then saw neurology of her concern that an MRI showed some degenerative disc disease in the lumbar spine and ended at getting referred to pain management, Dr. Jordan Likes.  In pain management clinic she received spinal injections and also started on narcotic pain medication. In her workup she also was seen by urology and underwent cystoscopy and bladder testing and was told she had overactive bladder and was started on mirabegron.  Her fentanyl has been titrated up and she is now on 75 mcg per hour patch every 3 days. She is taking gabapentin 100 twice daily. She was previous to taking high doses of gabapentin but was having trouble with somnolence. She also has oxycodone for breakthrough pain. For the last several months she has been on nitrofurantoin for suppression of chronic UTI. Also in February 2014 she had exploratory laparotomy by  Dr. Gaynelle Adu and was told that this was normal except for a suture in her pelvis near the bladder from previous surgery. There is no overt inflammation.  She continues to have lower abdominal in the midline and pelvic pain which she describes as "something trying to get out". This is a severe cramping abdominal pain which is worse with walking and bending. At times the cramping is so severe she develops vomiting. She is extremely frustrated by this pain and wants to be fixed. Her husband wants her tested for liver flukes because he saw a television show in which the patient was diagnosed with liver flukes after 4 years of chronic abdominal pain. He is requesting an ERCP. she has had no history of jaundice or elevated LFTs in our system. She denies international travel but report drinking well water at home. Her bowel habits recently have been soft but formed stools. She does have a prior history of diarrhea which for her is 3-4 times per day of watery stools. Since starting narcotics her diarrhea has been significantly less. No rectal bleeding or melena. Her last 3-6 weeks she has developed an epigastric burning pain which is worse with eating. With this she's had some poor appetite. No trouble swallowing or significant heartburn. She was started on pantoprazole 40 mg daily which initially seemed to help. She was also given a prescription for Carafate.  Of note the patient has had worsening of her symptoms around the time of her mother's death in 2011-09-15 and her father's death in 2013-12-04though the patient does not feel that this is significant or related.  Patient Active Problem List   Diagnosis Date Noted  . Lower abdominal pain 07/26/2012  . Chronic pain syndrome 06/21/2012  . Fibromyalgia 06/21/2012  . Carpal tunnel syndrome, bilateral 06/21/2012  . Degenerative disc disease, cervical 06/21/2012  . Facet arthropathy, cervical 06/21/2012  . DDD (degenerative disc disease),  lumbosacral 06/21/2012  . EBV infection 04/21/2012  . Lymphocytic colitis 12/01/2011  . Depressive disorder, not elsewhere classified 11/06/2010  . NEPHROLITHIASIS 03/06/2010  . OVARIAN CYST 03/06/2010  . TOBACCO ABUSE 05/28/2009  . MITRAL VALVE PROLAPSE 05/28/2009  . GASTRIC ULCER 05/26/2009  . HEART MURMUR, HX OF 05/26/2009  . UNSPECIFIED CARDIAC DYSRHYTHMIA 05/16/2009  . PALPITATIONS 05/16/2009  . HEMATURIA UNSPECIFIED 12/19/2008  . TMJ PAIN 07/26/2008    Past Surgical History  Procedure Laterality Date  . Appendectomy  2005  . Partial hysterectomy  2005  . Tubal ligation  2000  . Tonsillectomy  30 years ago  . Dilation and curettage of uterus      x 4  one after each SAB  . Bilateral oophorectomy  2012  . Exploratory laparotomy  2007  . Laparoscopy N/A 08/04/2012    Procedure: LAPAROSCOPY DIAGNOSTIC;  Surgeon: Atilano Ina, MD,FACS;  Location: [facility];  Service: General;  Laterality: N/A;    Current Outpatient Prescriptions  Medication Sig Dispense Refill  . ALPRAZolam (XANAX) 0.5 MG tablet Take 0.5 mg by mouth at bedtime as needed for anxiety.      [name] atenolol (TENORMIN) 25 MG tablet Take 25 mg by mouth at bedtime.       . citalopram (CELEXA) 20 MG tablet Take 20 mg by mouth every morning.      [name] estradiol (ESTRACE) 1 MG tablet Take 1 mg by mouth daily with supper.       . fentaNYL (DURAGESIC - DOSED MCG/HR) 75 MCG/HR Place 1 patch onto the skin every 3 (three) days.      [name] gabapentin (NEURONTIN) 100 MG capsule       . ibuprofen (ADVIL,MOTRIN) 200 MG tablet Take 400 mg by mouth every 8 (eight) hours as needed for pain.      [name] lidocaine (LIDODERM) 5 % Place 1 patch onto the skin daily as needed (For pain.).  30 patch  0  . mirabegron ER (MYRBETRIQ) 50 MG TB24 Take 1 tablet (50 mg total) by mouth daily.  30 tablet  0  . oxyCODONE-acetaminophen (ROXICET) 5-325 MG per tablet Take 1-2 tablets by mouth every 4 (four) hours as needed for pain. May start taking this if run out of  current home supply of percocet  40 tablet  0  . pantoprazole (PROTONIX) 40 MG tablet Take 1 tablet (40 mg total) by mouth daily.  30 tablet  2  . sucralfate (CARAFATE) 1 G tablet Take 1 tablet (1 g total) by mouth 4 (four) times daily.  80 tablet  0  . nitrofurantoin, macrocrystal-monohydrate, (MACROBID) 100 MG capsule Take one tab daily  30 capsule  6   No current facility-administered medications for this visit.    No Known Allergies  Family History  Problem Relation Age of Onset  . Heart attack Mother   . Cancer Father     tongue  . Cirrhosis Father   . Cirrhosis Mother     History  Substance Use Topics  . Smoking status: Current Every Day Smoker -- 0.25 packs/day for 20 years    Types: Cigarettes  . Smokeless tobacco: Never Used  . Alcohol Use: 1.0 oz/week  2 drink(s) per week     Comment: per week    ROS: As per history of present illness, otherwise negative  BP 88/60  Pulse 60  Ht 5\' 6"  (1.676 m)  Wt 156 lb 2 oz (70.818 kg)  BMI 25.21 kg/m2 Constitutional: Well-developed and well-nourished. No distress. HEENT: Normocephalic and atraumatic. Oropharynx is clear and moist.  No scleral icterus. Neck: Neck supple. Trachea midline. Cardiovascular: Normal rate, regular rhythm and intact distal pulses.  Pulmonary/chest: Effort normal and breath sounds normal. No wheezing, rales or rhonchi. Abdominal: The patient refuses abdominal examination today, there are multiple well-healed scars Extremities: no clubbing, cyanosis, or edema Neurological: Alert and oriented to person place and time. Skin: Skin is warm and dry. No rashes noted. Psychiatric: Depressed mood, at times tearful, normal behavior  RELEVANT LABS AND IMAGING: CBC    Component Value Date/Time   WBC 9.0 08/01/2012 1340   WBC 10.2 03/04/2012   RBC 4.56 08/01/2012 1340   HGB 14.4 08/01/2012 1340   HGB 12.6 03/06/2012 1321   HCT 43.2 08/01/2012 1340   PLT 386 08/01/2012 1340   MCV 94.7 08/01/2012 1340   MCH  31.6 08/01/2012 1340   MCHC 33.3 08/01/2012 1340   RDW 14.2 08/01/2012 1340   LYMPHSABS 1.7 02/22/2012 1431   MONOABS 0.7 02/22/2012 1431   EOSABS 0.1 02/22/2012 1431   BASOSABS 0.0 02/22/2012 1431    CMP     Component Value Date/Time   NA 139 09/25/2012 1350   K 4.4 09/25/2012 1350   CL 106 09/25/2012 1350   CO2 25 09/25/2012 1350   GLUCOSE 82 09/25/2012 1350   BUN 15 09/25/2012 1350   CREATININE 0.74 09/25/2012 1350   CREATININE 0.77 08/01/2012 1340   CALCIUM 9.3 09/25/2012 1350   PROT 6.4 09/25/2012 1350   ALBUMIN 3.9 09/25/2012 1350   AST 24 09/25/2012 1350   ALT 19 09/25/2012 1350   ALKPHOS 44 09/25/2012 1350   BILITOT 0.5 09/25/2012 1350   GFRNONAA >90 08/01/2012 1340   GFRAA >90 08/01/2012 1340   H. pylori IgG and IgM - negative   CT ABDOMEN AND PELVIS WITHOUT CONTRAST -- June 2013   Technique:  Multidetector CT imaging of the abdomen and pelvis was performed following the standard protocol without intravenous contrast.   Comparison: 02/04/2011   Findings: Minor subsegmental atelectasis noted at the lung bases. The heart is normal in size.  The liver, spleen, gallbladder and pancreas are unremarkable.  No bile duct dilation.  No adrenal masses.   Left renal cysts, the largest arising at the mid pole measuring 3.6 cm in greatest dimension.  Tiny nonobstructing stone in the lower pole of the left kidney.  These findings are stable.  No other intrarenal stones.  No other renal masses.  No hydronephrosis. Normal ureters.  Normal bladder.   Uterus is surgically absent.  No pelvic masses.  No pathologically enlarged lymph nodes.  No abnormal fluid collections.  Unremarkable bowel.  Status post appendectomy.   Mild degenerative changes of the lumbar spine.  No osteoblastic or osteolytic lesions.   IMPRESSION: No acute findings.  No ureteral stones or signs for obstruction.   Small stable nonobstructing stone in the left kidney and stable left renal cysts.  Kidneys otherwise  unremarkable.   Status post hysterectomy and appendectomy.   No other significant findings.   CT ABDOMEN AND PELVIS WITH CONTRAST -- Nov 2013   Technique:  Multidetector CT imaging of the abdomen and pelvis was performed  following the standard protocol during bolus administration of intravenous contrast.   Contrast: OMNIPAQUE IOHEXOL 300 MG/ML  SOLN   Comparison: CT of the abdomen and pelvis 11/18/2011.   Findings:   Lung Bases: Minimal dependent atelectasis in the lower lobes of the lungs bilaterally.   Abdomen/Pelvis:  The enhanced appearance of the liver, gallbladder, pancreas, spleen, bilateral adrenal glands in the right kidney is unremarkable.  A 3.8 cm simple cyst in the interpolar region of the left kidney is unchanged.  Small low attenuation lesions in the lower pole of the left kidney are similar to prior examinations, but are too small to definitively characterize on today's study.   No ascites or pneumoperitoneum and no pathologic distension of small bowel.  Status post appendectomy.  No definite pathologic lymphadenopathy identified within the abdomen or pelvis.  Status post hysterectomy.  Ovaries are not confidently identified and are likely surgically removed.   Musculoskeletal: There are no aggressive appearing lytic or blastic lesions noted in the visualized portions of the skeleton.   IMPRESSION: 1.  No acute findings in the abdomen or pelvis to account for the patient's symptoms. 2.  3.8 cm simple cyst in the interpolar region of the left kidney is unchanged, as are the sub-centimeter low attenuation lesions in the lower pole of the left kidney which is too small to definitively characterize. 3.  Postoperative changes, as above.  MRI ABDOMEN WITH AND WITHOUT CONTRAST -- Nov 2013   Technique:  Multiplanar multisequence MR imaging of the abdomen was performed both before and after administration of intravenous contrast.   Contrast: 17mL  MULTIHANCE GADOBENATE DIMEGLUMINE 529 MG/ML IV SOLN   Comparison: 04/15/2012   Findings: 5 x 6 mm T2 hyperintense lesion in segment 7 of the liver demonstrates progressive peripheral enhancement and delayed enhancement characteristic of a tiny hemangioma.  Liver otherwise unremarkable.   The gallbladder, pancreas, adrenal glands, and spleen appear normal.   3.4 x 2.9 cm left mid kidney Bosniak category one cyst noted.   1.5 x 0.7 cm left kidney lower pole Bosniak category II cyst has a single internal septation.   No pathologic retroperitoneal or porta hepatis adenopathy is identified.   IMPRESSION:   1.  Simple left mid kidney Bosniak category 1 cyst.  Benign left kidney lower pole Bosniak category 2 cyst.  These do not require further workup. 2.  6 mm hemangioma in segment 7 of the liver, no change from 2008, benign and not requiring any further workup.      ASSESSMENT/PLAN: 47 yo female with complicated PMH including but not limited to cervical cancer status post hysterectomy in 2007, ovarian cysts status post bilateral oophorectomy in December 2012, chronic lower abdominal and pelvic pain, overactive bladder, depression, lymphocytic colitis, nephrolithiasis, tobacco abuse, mitral valve prolapse who is seen in consultation at the request of Tandy Gaw, PA-C for evaluation of ongoing lower abdominal and pelvic pain and new epigastric pain.  1.  Chronic lower abd/pelvic pain -- to this point she has had an exhaustive workup of her lower and pelvic abdominal pain.  This includes multiple cross-sectional imaging studies including CT and MRI, colonoscopy, cystoscopy and bladder imaging, and diagnostic laparoscopy, all of which did not reveal any etiology to her pain. In the past he did respond to neuropathic pain medications to some degree though not fully. I've been direct with her today that I had not optimistic that I will be able to arrive at a new diagnosis which will solve her  issue. I have recommended that she continue to work with her chronic pain management specialist for better pain control strategies as necessary referral to a tertiary care center. She reports she has been told by Dr. Cyndia Diver office that she may need to be seen at [facility].  It is certainly possible that she could have a urogynecologic etiology to this pain, and a urogynecologic referral should be considered. Regarding her husband's fear of liver flukes, she has had no previously abnormal liver enzymes or evidence of obstruction. Previous liver imaging has been unremarkable, other than a stable hemangioma which is subcentimeter.  I have recommended against ERCP at this time due to the lack of a clear indication and risks associated with this procedure. They understand why I do not recommend this test.  See #2.  2.  Epigastric pain -- she does have a new epigastric component to her abdominal pain which seemed to respond briefly to PPI but no longer. We have discussed upper endoscopy including the risks and benefits today. I have also made it clear that I do not think the upper endoscopy will help in the diagnosis of her lower, chronic abdominal pain. I've also offered to defer this test to a tertiary care center if she wishes to seek consultation there at this time. After thorough discussion, he would like to proceed with upper endoscopy here and are aware of its overall low yield in her chronic pain.    Will await upper endoscopy and if negative likely refer her for a second opinion in pain management and possible urogynecology evaluation.   Greater than 45 minutes spent face to face with the patient and her husband today, additional time reviewing previous lab work and imaging studies

## 2012-11-13 NOTE — Progress Notes (Signed)
CC: [name] is a 47 y.o. female is here for Abdominal Pain   Subjective: HPI:  Followup abdominal pain: Patient reports lower abdominal pain Described as severe in severity has slightly improved since her last visit when she took 5 days of Bactrim due to urinalysis highly suspicious for UTI, interestingly urine culture was negative. She describes her improvement as no longer awakening at night due to the pain or having inability to sleep due to the pain. Still describes a stabbing sensation lower in her abdomen without radiation that is constant and has not responded to gabapentin, Lyrica, fentanyl.  She denies any constipation or diarrhea, dysuria, change in odor color or consistency of urine. Denies vaginal discharge or genital irritation.  There is a additional complaint of epigastric pain with early satiety for which she is having a upper endoscopy late this week or next week.   She denies fevers, chills, unintentional weight loss, nor blood in stool.   Review Of Systems Outlined In HPI  Past Medical History  Diagnosis Date  . Urinary calculus or stone   . Gastric ulcer   . Cancer     cervical  . MVP (mitral valve prolapse)   . IBS (irritable bowel syndrome)   . Heart murmur   . Mitral valve prolapse   . Dysrhythmia     pvc   . Anxiety   . Urinary tract infection   . Headache(784.0)      Family History  Problem Relation Age of Onset  . Heart attack Mother   . Cancer Father     tongue  . Cirrhosis Father   . Cirrhosis Mother      History  Substance Use Topics  . Smoking status: Current Every Day Smoker -- 0.25 packs/day for 20 years    Types: Cigarettes  . Smokeless tobacco: Never Used  . Alcohol Use: 1.0 oz/week    2 drink(s) per week     Comment: per week     Objective: Filed Vitals:   11/13/12 1429  BP: 96/62  Pulse: 58  Temp: 97.3 F (36.3 C)    Vital signs reviewed. General: Alert and Oriented, No Acute Distress HEENT: Pupils equal, round,  reactive to light. Conjunctivae clear.  External ears unremarkable.  Moist mucous membranes. Lungs: Clear and comfortable work of breathing, speaking in full sentences without accessory muscle use. Cardiac: Regular rate and rhythm.  Neuro: CN II-XII grossly intact, gait normal. Extremities: No peripheral edema.  Strong peripheral pulses.  Mental Status: No depression, anxiety, nor agitation. Logical though process. Skin: Warm and dry.  Assessment & Plan: Bernardine was seen today for abdominal pain.  Diagnoses and associated orders for this visit:  Abdominal pain, unspecified site - Urinalysis Dipstick - Urine Culture - GC/chlamydia probe amp, urine  Chronic pelvic pain in female - Ambulatory referral to Urology    At her last visit we discussed whether or not she wanted pelvic physical therapy to provide her with as much resources as possible given an extensive workup for this chronic pain without improvement or etiology. She and her husband are indeed interested in this referral has been placed. Given the inconsistency with urinalysis and culture last week Will repeat culture. Exploratory laparoscopy mentions "violin strings" between liver and abdominal wall surface consistent with PID. I will check gonorrhea and Chlamydia from urine sample today.  Return if symptoms worsen or fail to improve.

## 2012-11-13 NOTE — Patient Instructions (Addendum)

## 2012-11-16 ENCOUNTER — Telehealth: Payer: Self-pay | Admitting: *Deleted

## 2012-11-16 ENCOUNTER — Ambulatory Visit (AMBULATORY_SURGERY_CENTER): Payer: BC Managed Care – PPO | Admitting: Internal Medicine

## 2012-11-16 ENCOUNTER — Encounter: Payer: Self-pay | Admitting: Internal Medicine

## 2012-11-16 VITALS — BP 87/58 | HR 54 | Temp 97.0°F | Resp 16 | Ht 66.0 in | Wt 156.0 lb

## 2012-11-16 DIAGNOSIS — R112 Nausea with vomiting, unspecified: Secondary | ICD-10-CM

## 2012-11-16 DIAGNOSIS — K2980 Duodenitis without bleeding: Secondary | ICD-10-CM

## 2012-11-16 DIAGNOSIS — R1013 Epigastric pain: Secondary | ICD-10-CM

## 2012-11-16 DIAGNOSIS — K2970 Gastritis, unspecified, without bleeding: Secondary | ICD-10-CM

## 2012-11-16 DIAGNOSIS — K2990 Gastroduodenitis, unspecified, without bleeding: Secondary | ICD-10-CM

## 2012-11-16 MED ORDER — SODIUM CHLORIDE 0.9 % IV SOLN: 500.0000 mL | INTRAVENOUS | Status: DC

## 2012-11-16 NOTE — Patient Instructions (Addendum)

## 2012-11-16 NOTE — Progress Notes (Signed)
Patient did not experience any of the following events: a burn prior to discharge; a fall within the facility; wrong site/side/patient/procedure/implant event; or a hospital transfer or hospital admission upon discharge from the facility. (G8907) Patient did not have preoperative order for IV antibiotic SSI prophylaxis. (G8918)  

## 2012-11-16 NOTE — Telephone Encounter (Signed)
Scheduled pt to see Dr Marcelyn Bruins on 01/17/13 at 2:30pm at the [facility] location at [address] Faxed chart info to [phone],  Ph [phone]. lmom for pt to call back.

## 2012-11-16 NOTE — Op Note (Signed)
[facility] [address] [place], [address]   ENDOSCOPY PROCEDURE REPORT  PATIENT: Zyan, Mirkin  MR#: [id number] BIRTHDATE: May 19, 1966 , 46  yrs. old GENDER: Female ENDOSCOPIST: Beverley Fiedler, MD REFERRED BY:  Tandy Gaw, PA-C PROCEDURE DATE:  11/16/2012 PROCEDURE:  EGD w/ biopsy ASA CLASS:     Class III INDICATIONS:  Acute epigastric pain, chronic lower abdominal and pelvic pain. MEDICATIONS: MAC sedation, administered by CRNA and propofol (Diprivan) 150mg  IV TOPICAL ANESTHETIC: Cetacaine Spray  DESCRIPTION OF PROCEDURE: After the risks benefits and alternatives of the procedure were thoroughly explained, informed consent was obtained.  The LB [id number] endoscope was introduced through the mouth and advanced to the second portion of the duodenum. Without limitations.  The instrument was slowly withdrawn as the mucosa was fully examined.   ESOPHAGUS: The mucosa of the esophagus appeared normal.  STOMACH: There was mild antral gastropathy noted characterized by erythema.  Cold forcep biopsies were taken at the antrum and angularis.   The stomach otherwise appeared normal.  DUODENUM: Moderate duodenal inflammation was found in the duodenal bulb.   The duodenal mucosa showed no abnormalities in the 2nd part of the duodenum.  Cold forceps biopsies were taken in the bulb and second portion.  Retroflexed views revealed no abnormalities. The scope was then withdrawn from the patient and the procedure completed.  COMPLICATIONS: There were no complications. ENDOSCOPIC IMPRESSION: 1.   The mucosa of the esophagus appeared normal 2.   There was mild antral gastropathy noted; biopsies taken 3.   The stomach otherwise appeared normal 4.   Duodenal inflammation was found in the duodenal bulb; biopsies taken 5.   The duodenal mucosa showed no abnormalities in the 2nd part of the duodenum  RECOMMENDATIONS: 1.  Await pathology results 2.  Follow-up  of helicobacter pylori status, treat if indicated 3.  Avoid NSAIDS 4.  Continue taking your PPI (pantoprazole) once daily.  It is best to be taken 20-30 minutes prior to breakfast meal. 5.  Urogynecology referral for chronic lower abdominal and pelvic pain  eSigned:  Beverley Fiedler, MD 11/16/2012 10:30 AM             CC:The Patient  PATIENT NAME:  Ameira, Alessandrini MR#: [id number]

## 2012-11-16 NOTE — Telephone Encounter (Signed)
Message copied by Florene Glen on Thu Nov 16, 2012  1:23 PM ------      Message from: Beverley Fiedler      Created: Thu Nov 16, 2012 11:36 AM      Regarding: Referral       Please refer this patient to:            Jamison Neighbor, M.D.      Associate Professor, Urology      Clinical Specialties      Pelvic Pain, Pelvic Floor Dysfunction      Contact Information      New Patient Appointments: [phone]       Returning Patient Appointments: [phone]      Department: [phone]            For chronic lower abd/pelvic pain -- has had previous GI workup elsewhere and this MD would appreciate our records and have the patient obtain her outside GI records to take to this appt.      Thanks       ------

## 2012-11-16 NOTE — Progress Notes (Signed)
Called to room to assist during endoscopic procedure.  Patient ID and intended procedure confirmed with present staff. Received instructions for my participation in the procedure from the performing physician.  

## 2012-11-17 ENCOUNTER — Telehealth: Payer: Self-pay | Admitting: Physician Assistant

## 2012-11-17 ENCOUNTER — Telehealth: Payer: Self-pay | Admitting: *Deleted

## 2012-11-17 DIAGNOSIS — G8929 Other chronic pain: Secondary | ICD-10-CM

## 2012-11-17 DIAGNOSIS — R109 Unspecified abdominal pain: Secondary | ICD-10-CM

## 2012-11-17 LAB — CBC WITH DIFFERENTIAL/PLATELET
Basophils Absolute: 0 10*3/uL (ref 0.0–0.1)
Eosinophils Relative: 19 % — ABNORMAL HIGH (ref 0–5)
Lymphocytes Relative: 28 % (ref 12–46)
MCV: 90.5 fL (ref 78.0–100.0)
Platelets: 331 10*3/uL (ref 150–400)
RDW: 14.4 % (ref 11.5–15.5)
WBC: 9 10*3/uL (ref 4.0–10.5)

## 2012-11-17 NOTE — Telephone Encounter (Signed)
  Follow up Call-  Call back number 11/16/2012  Post procedure Call Back phone  # [phone] cell  Permission to leave phone message Yes     Patient questions:  Do you have a fever, pain , or abdominal swelling? no Pain Score  0 *  Have you tolerated food without any problems? yes  Have you been able to return to your normal activities? yes  Do you have any questions about your discharge instructions: Diet   no Medications  no Follow up visit  no  Do you have questions or concerns about your Care? no  Actions: * If pain score is 4 or above: No action needed, pain <4.

## 2012-11-17 NOTE — Telephone Encounter (Signed)
Kathlene November notified of lab order.

## 2012-11-17 NOTE — Telephone Encounter (Signed)
Call pt and let her know I talked with Dr. Dixie Dials from allergy office. I would like to get a CBC and see results. She is wanting to rule out prophyria. That is a long shot but want to rule out all possibilities. Come to lab any time.

## 2012-11-17 NOTE — Telephone Encounter (Signed)
Informed pt of appt date, address and telephone number for Dr Logan Bores; pt stated understanding.

## 2012-11-20 ENCOUNTER — Other Ambulatory Visit: Payer: Self-pay | Admitting: Physician Assistant

## 2012-11-20 DIAGNOSIS — R898 Other abnormal findings in specimens from other organs, systems and tissues: Secondary | ICD-10-CM

## 2012-11-20 DIAGNOSIS — G8929 Other chronic pain: Secondary | ICD-10-CM

## 2012-11-20 NOTE — Progress Notes (Signed)
Left detail message informing of referral.

## 2012-11-20 NOTE — Progress Notes (Signed)
Referral made 

## 2012-11-21 ENCOUNTER — Telehealth: Payer: Self-pay | Admitting: Hematology & Oncology

## 2012-11-21 NOTE — Telephone Encounter (Signed)
PT AWARE OF 12-20-12 APPOINTMENT

## 2012-11-22 ENCOUNTER — Telehealth: Payer: Self-pay | Admitting: Hematology & Oncology

## 2012-11-22 NOTE — Telephone Encounter (Signed)
Pt called left message wanting to be seen sooner. Per MD ok to schedule at 430 pm. Left pt message to call we can see her sooner.

## 2012-11-22 NOTE — Telephone Encounter (Signed)
Pt aware of 6-18 appointment and that she may have to wait up to an hour to see MD. MD aware of 430 appointment

## 2012-11-23 ENCOUNTER — Telehealth: Payer: Self-pay | Admitting: *Deleted

## 2012-11-23 MED ORDER — DEXLANSOPRAZOLE 60 MG PO CPDR: DELAYED_RELEASE_CAPSULE | ORAL | Status: DC

## 2012-11-23 NOTE — Telephone Encounter (Signed)
Informed pt of results per Dr Rhea Belton and recommendations. Ordered Dexilant and pt already has a referral to Dr Logan Bores. Pt stated understanding.

## 2012-11-23 NOTE — Telephone Encounter (Signed)
Message copied by Florene Glen on Thu Nov 23, 2012  3:08 PM ------      Message from: Beverley Fiedler      Created: Wed Nov 22, 2012 10:46 PM       Please call patient with pathology report      Pathology results indicate peptic duodenitis, acid related change in the first portion of the small bowel      Stomach biopsies negative for H. pylori infection or abnormal cells      She has been on acid suppression medication with pantoprazole with incomplete response; would recommend a change Dexilant 60 mg daily (30 minutes before breakfast daily)      Please refer to Jamison Neighbor, M.D. -- for chronic lower abdominal/pelvic pain      Associate Professor, Urology      Clinical Specialties      Pelvic Pain, Pelvic Floor Dysfunction      Contact Information      New Patient Appointments: [phone]       Returning Patient Appointments: [phone]      Department: [phone] ------

## 2012-11-29 ENCOUNTER — Telehealth: Payer: Self-pay | Admitting: Internal Medicine

## 2012-11-29 ENCOUNTER — Ambulatory Visit: Payer: BC Managed Care – PPO

## 2012-11-29 ENCOUNTER — Ambulatory Visit (HOSPITAL_BASED_OUTPATIENT_CLINIC_OR_DEPARTMENT_OTHER): Payer: BC Managed Care – PPO | Admitting: Hematology & Oncology

## 2012-11-29 ENCOUNTER — Other Ambulatory Visit (HOSPITAL_BASED_OUTPATIENT_CLINIC_OR_DEPARTMENT_OTHER): Payer: BC Managed Care – PPO | Admitting: Lab

## 2012-11-29 VITALS — BP 100/47 | HR 52 | Temp 98.0°F | Resp 16 | Ht 65.0 in | Wt 157.0 lb

## 2012-11-29 DIAGNOSIS — D510 Vitamin B12 deficiency anemia due to intrinsic factor deficiency: Secondary | ICD-10-CM

## 2012-11-29 DIAGNOSIS — D721 Eosinophilia, unspecified: Secondary | ICD-10-CM

## 2012-11-29 LAB — CBC WITH DIFFERENTIAL (CANCER CENTER ONLY)
BASO%: 0.4 % (ref 0.0–2.0)
EOS%: 11.8 % — ABNORMAL HIGH (ref 0.0–7.0)
LYMPH%: 31.2 % (ref 14.0–48.0)
MCH: 31 pg (ref 26.0–34.0)
MCHC: 32.6 g/dL (ref 32.0–36.0)
MCV: 95 fL (ref 81–101)
MONO#: 0.9 10*3/uL (ref 0.1–0.9)
MONO%: 11.2 % (ref 0.0–13.0)
NEUT%: 45.4 % (ref 39.6–80.0)
Platelets: 296 10*3/uL (ref 145–400)
RDW: 14.5 % (ref 11.1–15.7)
WBC: 8 10*3/uL (ref 3.9–10.0)

## 2012-11-29 LAB — CHCC SATELLITE - SMEAR

## 2012-11-29 NOTE — Progress Notes (Signed)
This office note has been dictated.

## 2012-11-30 ENCOUNTER — Telehealth: Payer: Self-pay | Admitting: Hematology & Oncology

## 2012-11-30 NOTE — Telephone Encounter (Signed)
[name] in scheduling will contact pt for BMBX appointment

## 2012-12-01 NOTE — Telephone Encounter (Signed)
Spoke to pt. She said Dexilant was helping her, but she cannot afford to pay $120 for a 3 month supply, she has recently gone on disability. I told her I went on [employer] website and printed off a form I will mail to her for pt. Assistance program. i asked if she wanted me to sent her a prescription for Asiphex; which is what Dr. Rhea Belton recommends if she cannot afford Dexilant. Pt said she has enough dexilant to last her a while. I told her to call me if she runs out and we can get samlpes to hold her over. Pt verbalized understanding

## 2012-12-01 NOTE — Progress Notes (Signed)
CC:   Shelly Gaw, PA-C  DIAGNOSES: 1. Eosinophilia. 2. Chronic abdominal pain.  HISTORY OF PRESENT ILLNESS:  [name] is a very nice 47 year old white female.  She is followed at [facility] by [name], P.A.-C.  [name] has a very complicated history.  She has had chronic abdominal pain, appears to have been going on for 1-1/2 years.  She has had multiple abdominal surgeries.  She did have cervical cancer and underwent a hysterectomy back in 2007.  She underwent bilateral oophorectomy in December 2012.  She has had abdominal pain since I think in [place] of last year.  She has had multiple evaluations for this.  She actually underwent a diagnostic laparoscopy back in February of this year.  What was found, was a small of surgical staple.  She had been diagnosed with lymphocytic colitis on a colonoscopy.  At the time surgery, there were no obvious intra- abdominal findings.  The intestines all looked fine.  There may have been some adhesions.  No hernias were noted.  There was intestinal torsion.  No lymphadenopathy was appreciated.  No evidence of endometriosis was noted.  Dr. Andrey Campanile was very thorough with his evaluation.  Biopsies taken at the time only showed the surgical staple.  This unfortunately did not help her pain. She recently had an upper endoscopy done.  This was done by Erick Blinks, M.D.  The pathological report showed a peptic duodenitis.  There was nothing otherwise that was noted to be abnormal.  Helicobacter was negative.  There was some chronic inactive gastritis.  She does see a pain specialist.  She is on a fentanyl patch.  She did have a test done for porphyria last December.  The test was mildly elevated, this was of unclear significance.  She had some laboratory work done by Dr. Caleen Essex.  This showed that she had an elevated eosinophil count.  Her CBC done back on 11/17/2012, showed a white cell count of 9, hemoglobin  12.5, hematocrit 37.9 and a platelet count 331.  Her white cell differential showed 45 segs, 28 lymphs, 8 Monos, 19 eosinophils.  Based on this, it was felt that she needed to be seen by Hematology.  She apparently was seen by an allergist, who thought that porphyria needed to be looked into again.  [name] is quite nice.  She is also quite tanned.  She does go to a tanning bed.  She likes to be out in the sun.  She has lost some weight; she has lost about 20 pounds in 6 weeks.  There is no diarrhea.  She may have a little constipation from the narcotic pain medication.  She does smoke, but is cutting back.  She has not noticed any kind of rashes. There has been no bleeding.  There has been no cough.  There has been no dysphagia or odynophagia.  She has had no mouth sores.  There has been no blurry vision or double vision.  She has not noticed any swollen lymph glands.  As such, she was kindly referred to the [facility] for an evaluation.  PAST MEDICAL/SURGICAL HISTORY: 1. Remarkable for "lymphocytic colitis.". 2. Cervical cancer, status post hysterectomy. 3. Nephrolithiasis. 4. Oophorectomies secondary to ovarian cyst __________. 5. Gastric ulcers. 6. Mitral valve prolapse. 7. Fibromyalgia. 8. Carpal tunnel syndrome. 9. Degenerative disk disease in her back. 10.Situational depression.  ALLERGIES:  No known allergies.  MEDICATIONS: 1. Xanax 0.5 mg q.h.s. p.r.n. 2. Atenolol 25 mg p.o.  q.h.s. 3. Celexa 20 mg p.o. daily. 4. Estrace 1 mg p.o. daily. 5. Fentanyl patch 75 mcg to the skin q. 3 days. 6. Lidoderm patch as needed. 7. Mirabegron ER 50 mg p.o. daily. 8. Percocet (5/325) 1 to 2 p.o. q.4 hours p.r.n. 9. Protonix 40 mg p.o. daily. 10.Carafate 1 g p.o. q.i.d. 11.Macrobid daily.  SOCIAL HISTORY:  Remarkable for tobacco use; she applies about a 15-20 pack-year  history of tobacco use.  She has social alcohol use.  There are no obvious  occupational exposures.  FAMILY HISTORY:  Remarkable for father who died of __________ cancer. Her mother died of a heart attack.  REVIEW OF SYSTEMS:  As stated in history present illness.  No additional findings are noted on 12-system review.  PHYSICAL EXAMINATION:  General:  This is a well-developed, well- nourished white female in no obvious distress.  Vital Signs: Temperature 98.6, pulse 52, respiratory rate 18, blood pressure 100/47; weight 1__________7.  Head/Neck:  Normocephalic, atraumatic skull. There are no ocular or oral lesions.  There are no palpable cervical or supraclavicular lymph nodes.  Lungs:  Clear bilaterally.  Cardiac: Regular rate and rhythm with a normal S1, S2.  She may have a 1/6 systolic ejection murmur.  Abdomen:  Soft.  She does have tenderness over the abdomen.  She has no obvious rebound tenderness.  There is no fluid wave.  There is no palpable abdominal mass.  There is no palpable hepatosplenomegaly.  Back:  No tenderness over the spine, ribs or hips. Extremities:  No clubbing, cyanosis or edema.  She has good range motion of her joints.  Skin:  No rashes, ecchymoses or petechia.  Neurologic: Shows no focal neurological deficits.  LABORATORY STUDIES:  White cell count 8, hemoglobin 12.5, hematocrit 38.3, platelet count 296.  White cell differential shows 45 segs, 31 lymphs, 11 Monos, 12 eosinophils.  Peripheral smear shows a normochromic, normocytic population of red blood cells.  There are no nucleated red blood cells.  There are no teardrop cells.  There is no rouleaux formation.  I see no target cells. There are no schistocytes.  White cells appear normal morphology.  There may be a slight increase in eosinophils.  The eosinophils look mature.  I do not see any dysplastic-appearing eosinophils.  There are no immature myeloid cells.  There are no atypical lymphocytes.  There are no blasts.  Platelets are adequate in number and  size.  IMPRESSION/PLAN:  [name] is a very nice 47 year old white female with mild eosinophilia.  One would not think that she has a bone marrow disorder; however, given all of her other problems, 1 probably does need to rule this out.  I did send off a JAK2 assay on her.  I also sent off a serum tryptase level.  However, I think that a bone marrow biopsy is going to be necessary here.  I think that a bone marrow biopsy will give [place] a definitive answer as to whether or not there is a bilobed proliferative neoplasm.  I would not think that she would have a eosinophilic enteritis.  One would think that if this were the case, 1 would see it on the upper endoscopy that she had, or 1 would see changes in her intestines with the laparoscopy that she had back in February.  I would think porphyria would be highly unlikely; however, since porphyria tends to have a wide variety of symptoms, I think that we probably need to check this again.  As  such, we will have to see about getting her to do a 24-hour urine for porphyria.  I just would be hard pressed to say that she has a blood disorder.  I am not sure what all this abdominal pain is about.  Ultimately, I suppose she might need another laparoscopy or laparotomy. Again, this would be incredibly aggressive and another surgical procedure which 1 would like to avoid, if it all possible.  We will have to see if [name] needs to come back to the office.  We will try to do as much of our workup with her outside the office, and try to talk to her by phone to her the results.  Today, I spent over an hour with her and husband.  They are both very- very nice.  I did have a nice time with her.  I feel bad that she is having these issues and just hurts quite a bit.    ______________________________ Josph Macho, M.D. PRE/MEDQ  D:  11/29/2012  T:  11/30/2012  Job:  [id number]

## 2012-12-05 ENCOUNTER — Telehealth: Payer: Self-pay | Admitting: Internal Medicine

## 2012-12-05 NOTE — Telephone Encounter (Signed)
lmom for pt to call back

## 2012-12-06 ENCOUNTER — Encounter (HOSPITAL_COMMUNITY): Payer: Self-pay | Admitting: Pharmacy Technician

## 2012-12-06 ENCOUNTER — Other Ambulatory Visit: Payer: Self-pay | Admitting: Radiology

## 2012-12-11 ENCOUNTER — Ambulatory Visit (HOSPITAL_COMMUNITY)
Admission: RE | Admit: 2012-12-11 | Discharge: 2012-12-11 | Disposition: A | Discharge status: Home / Self Care | Payer: BC Managed Care – PPO | Source: Ambulatory Visit | Attending: Hematology & Oncology | Admitting: Hematology & Oncology

## 2012-12-11 ENCOUNTER — Encounter (HOSPITAL_COMMUNITY): Payer: Self-pay

## 2012-12-11 DIAGNOSIS — Z79899 Other long term (current) drug therapy: Secondary | ICD-10-CM | POA: Insufficient documentation

## 2012-12-11 DIAGNOSIS — I059 Rheumatic mitral valve disease, unspecified: Secondary | ICD-10-CM | POA: Insufficient documentation

## 2012-12-11 DIAGNOSIS — Z8541 Personal history of malignant neoplasm of cervix uteri: Secondary | ICD-10-CM | POA: Insufficient documentation

## 2012-12-11 DIAGNOSIS — D721 Eosinophilia, unspecified: Secondary | ICD-10-CM | POA: Insufficient documentation

## 2012-12-11 LAB — CBC
MCV: 91.7 fL (ref 78.0–100.0)
Platelets: 263 10*3/uL (ref 150–400)
RBC: 4.22 MIL/uL (ref 3.87–5.11)
WBC: 6.9 10*3/uL (ref 4.0–10.5)

## 2012-12-11 LAB — BONE MARROW EXAM

## 2012-12-11 MED ORDER — HYDROCODONE-ACETAMINOPHEN 5-325 MG PO TABS
1.0000 | ORAL_TABLET | ORAL | Status: DC | PRN
  Filled 2012-12-11: qty 2

## 2012-12-11 MED ORDER — SODIUM CHLORIDE 0.9 % IV SOLN: Freq: Once | INTRAVENOUS | Status: DC

## 2012-12-11 MED ORDER — MIDAZOLAM HCL 2 MG/2ML IJ SOLN
INTRAMUSCULAR | Status: AC | PRN
  Administered 2012-12-11 (×3): 1 mg via INTRAVENOUS

## 2012-12-11 MED ORDER — HYDROMORPHONE HCL PF 2 MG/ML IJ SOLN
INTRAMUSCULAR | Status: AC
  Filled 2012-12-11: qty 1

## 2012-12-11 MED ORDER — FENTANYL CITRATE 0.05 MG/ML IJ SOLN
INTRAMUSCULAR | Status: AC | PRN
  Administered 2012-12-11: 100 ug via INTRAVENOUS
  Administered 2012-12-11: 50 ug via INTRAVENOUS

## 2012-12-11 MED ORDER — FENTANYL CITRATE 0.05 MG/ML IJ SOLN
INTRAMUSCULAR | Status: AC
  Filled 2012-12-11: qty 8

## 2012-12-11 MED ORDER — MIDAZOLAM HCL 2 MG/2ML IJ SOLN
INTRAMUSCULAR | Status: AC
  Filled 2012-12-11: qty 8

## 2012-12-11 NOTE — Procedures (Signed)
CT guided bone marrow biopsy.  No immediate complication.   

## 2012-12-11 NOTE — Telephone Encounter (Signed)
Pt never called back.

## 2012-12-11 NOTE — Progress Notes (Signed)
Chief Complaint: "I'm here for a biopsy" Referring Physician:Ennever HPI: [name] is an 47 y.o. female with mild eosinophilia. She is referred for bone marrow biopsy as part of her workup. PMHx and meds reviewed.   Past Medical History:  Past Medical History  Diagnosis Date  . Urinary calculus or stone   . Gastric ulcer   . Cancer     cervical  . MVP (mitral valve prolapse)   . IBS (irritable bowel syndrome)   . Heart murmur   . Mitral valve prolapse   . Dysrhythmia     pvc   . Anxiety   . Urinary tract infection   . Headache(784.0)   . Colitis     lymphacitic  . Depression     Past Surgical History:  Past Surgical History  Procedure Laterality Date  . Appendectomy  2005  . Partial hysterectomy  2005  . Tubal ligation  2000  . Tonsillectomy  30 years ago  . Dilation and curettage of uterus      x 4  one after each SAB  . Bilateral oophorectomy  2012  . Exploratory laparotomy  2007  . Laparoscopy N/A 08/04/2012    Procedure: LAPAROSCOPY DIAGNOSTIC;  Surgeon: Atilano Ina, MD,FACS;  Location: [facility];  Service: General;  Laterality: N/A;    Family History:  Family History  Problem Relation Age of Onset  . Heart attack Mother   . Cirrhosis Mother   . Cancer Father     tongue  . Cirrhosis Father   . Colon cancer Neg Hx   . Esophageal cancer Neg Hx   . Rectal cancer Neg Hx   . Stomach cancer Neg Hx     Social History:  reports that she has been smoking Cigarettes.  She has a 5 pack-year smoking history. She has never used smokeless tobacco. She reports that  drinks alcohol. She reports that she does not use illicit drugs.  Allergies: No Known Allergies  Medications: ALPRAZolam (XANAX) 0.5 MG tablet (Taking) 03/21/2012 Sig - Route: Take 0.5 mg by mouth at bedtime as needed for anxiety. - Oral Class: Historical Med Number of times this order has been changed since signing: 1 Order [address] atenolol (TENORMIN) 25 MG tablet (Taking) Sig - Route: Take 25 mg  by mouth at bedtime. - Oral Class: Historical Med Number of times this order has been changed since signing: 3 Order [address] citalopram (CELEXA) 20 MG tablet (Taking) 07/25/2012 Sig - Route: Take 20 mg by mouth every morning. - Oral Class: Historical Med Number of times this order has been changed since signing: 1 Order [address] estradiol (ESTRACE) 1 MG tablet (Taking) Sig - Route: Take 1 mg by mouth daily with supper. - Oral Class: Historical Med Number of times this order has been changed since signing: 3 Order [address] fentaNYL (DURAGESIC - DOSED MCG/HR) 75 MCG/HR (Taking) Sig - Route: Place 1 patch onto the skin every other day. - Transdermal Class: Historical Med Number of times this order has been changed since signing: 3 Order [address] gabapentin (NEURONTIN) 100 MG capsule (Taking) 11/12/2012 Sig - Route: Take 100 mg by mouth 3 (three) times daily. - Oral Class: Historical Med Number of times this order has been changed since signing: 3 Order [address] ibuprofen (ADVIL,MOTRIN) 200 MG tablet (Taking) Sig - Route: Take 400 mg by mouth every 6 (six) hours as needed for pain. - Oral Class: Historical Med Number of times this order has been changed  since signing: 4 Order [address] lidocaine (LIDODERM) 5 % (Taking) 09/25/2012 Sig - Route: Place 1 patch onto the skin daily as needed (For pain.). - Transdermal Class: Historical Med Number of times this order has been changed since signing: 2 Order [address] oxyCODONE-acetaminophen (ROXICET) 5-325 MG per tablet (Taking) 40 tablet 0 08/04/2012 Sig - Route: Take 1-2 tablets by mouth every 4 (four) hours as needed for pain. May start taking this if run out of current home supply of per    Please HPI for pertinent positives, otherwise complete 10 system ROS negative.  Physical Exam: BP 93/52  Pulse 52  Temp(Src) 98.1 F (36.7 C) (Oral)  Resp 18  Ht 5\' 5"  (1.651 m)  Wt 157 lb (71.215 kg)  BMI 26.13 kg/m2  SpO2 98% Body mass index is 26.13  kg/(m^2).   General Appearance:  Alert, cooperative, no distress, appears stated age  Head:  Normocephalic, without obvious abnormality, atraumatic  ENT: Unremarkable  Neck: Supple, symmetrical, trachea midline  Lungs:   Clear to auscultation bilaterally, no w/r/r, respirations unlabored without use of accessory muscles.  Chest Wall:  No tenderness or deformity  Heart:  Regular rate and rhythm, S1, S2 normal, no murmur, rub or gallop.  Neurologic: Normal affect, no gross deficits.   Results for orders placed during the hospital encounter of 12/11/12 (from the past 48 hour(s))  APTT     Status: None   Collection Time    12/11/12  7:20 AM      Result Value Range   aPTT 34  24 - 37 seconds  CBC     Status: None   Collection Time    12/11/12  7:20 AM      Result Value Range   WBC 6.9  4.0 - 10.5 K/uL   RBC 4.22  3.87 - 5.11 MIL/uL   Hemoglobin 12.6  12.0 - 15.0 g/dL   HCT 60.4  54.0 - 98.1 %   MCV 91.7  78.0 - 100.0 fL   MCH 29.9  26.0 - 34.0 pg   MCHC 32.6  30.0 - 36.0 g/dL   RDW 19.1  47.8 - 29.5 %   Platelets 263  150 - 400 K/uL  PROTIME-INR     Status: None   Collection Time    12/11/12  7:20 AM      Result Value Range   Prothrombin Time 12.4  11.6 - 15.2 seconds   INR 0.94  0.00 - 1.49   No results found.  Assessment/Plan Eosinophilia Discussed CT guided BM biopsy, risks, complications, use of sedation. Labs reviewed, ok Consent signed in chart  Brayton El PA-C 12/11/2012, 8:19 AM

## 2012-12-19 ENCOUNTER — Telehealth: Payer: Self-pay | Admitting: Hematology & Oncology

## 2012-12-19 NOTE — Telephone Encounter (Signed)
I called [name] and told her about her lab results to have back so far. Her bone marrow biopsy is negative for any obvious hematologic infiltration with her bone marrow. Her JAK2 assay is normal. I said that he tryptase level. This was also normal. Her LDH and vitamin B12 are also normal.  So far, we've not found any obvious hematologic issue that would explain her chronic abdominal pain.  We will do a 24-hour urine for porphyrins. Her husband will come and pick this up. I told her to give [place] as much urine as possible over 24 hours as this will give [place] the best results.  She was relieved that there is no obvious bone marrow disorder. She is still frustrated not knowing what is causing the abdominal pain..  Once I get the results back from the 24-hour urine, then we will give her a call. We'll keep try to take care of most of this over the phone to help save her the time having to come into the office.  Pete E.

## 2012-12-19 NOTE — Addendum Note (Signed)
Addended by: Arlan Organ R on: 12/19/2012 09:47 AM   Modules accepted: Orders

## 2012-12-20 ENCOUNTER — Ambulatory Visit: Payer: BC Managed Care – PPO

## 2012-12-20 ENCOUNTER — Ambulatory Visit: Payer: BC Managed Care – PPO | Admitting: Hematology & Oncology

## 2012-12-20 ENCOUNTER — Telehealth: Payer: Self-pay | Admitting: Hematology & Oncology

## 2012-12-20 ENCOUNTER — Other Ambulatory Visit: Payer: BC Managed Care – PPO | Admitting: Lab

## 2012-12-20 LAB — CHROMOSOME ANALYSIS, BONE MARROW

## 2012-12-20 NOTE — Telephone Encounter (Signed)
Faxed Medical Records via fax today to: [facility]       Dr. Ardell Isaacs       [address]       [place], [place]  [address]       Ph: [phone]       Fx: [phone]  Medical  Records requested notes and labs on 11/25/2012   CONSENT COPY SCANNED

## 2012-12-21 ENCOUNTER — Other Ambulatory Visit: Payer: BC Managed Care – PPO | Admitting: Lab

## 2012-12-28 LAB — PORPHYRINS, FRACTIONATED URINE (TIMED COLLECTION)
Coproporphyrin I: 45.8 mcg/24 h (ref 7.1–48.7)
Coproporphyrin III: 32.2 mcg/24 h (ref 11.0–148.5)
Porphyrins total: 98.7 mcg/24 h (ref 35.0–210.7)
Uroporphyrin III (PORPF): 4.5 mcg/24 h (ref 0.7–7.4)

## 2013-01-01 ENCOUNTER — Telehealth: Payer: Self-pay | Admitting: Hematology & Oncology

## 2013-01-01 NOTE — Telephone Encounter (Signed)
I called Ms. Washam she told her 24-hour urine for porphyrins was in normal.  All of her workup so far has been negative for any hematologic issue. As such, I don't think we need to get her back here for any additional workup.  She's going to go to [facility] and be seen there. I told her that they have access to offer her records here and all the test results.  I told her that we would pray for her and that hopefully it will be uncovered as to what is wrong.  [name]

## 2013-01-03 ENCOUNTER — Encounter: Payer: Self-pay | Admitting: Hematology & Oncology

## 2013-01-08 ENCOUNTER — Encounter: Payer: Self-pay | Admitting: Physician Assistant

## 2013-01-08 ENCOUNTER — Ambulatory Visit (INDEPENDENT_AMBULATORY_CARE_PROVIDER_SITE_OTHER): Payer: BC Managed Care – PPO | Admitting: Physician Assistant

## 2013-01-08 ENCOUNTER — Other Ambulatory Visit (HOSPITAL_COMMUNITY)
Admission: RE | Admit: 2013-01-08 | Discharge: 2013-01-08 | Disposition: A | Discharge status: Home / Self Care | Payer: BC Managed Care – PPO | Source: Ambulatory Visit | Attending: Family Medicine | Admitting: Family Medicine

## 2013-01-08 VITALS — BP 99/67 | HR 54 | Wt 150.0 lb

## 2013-01-08 DIAGNOSIS — Z01419 Encounter for gynecological examination (general) (routine) without abnormal findings: Secondary | ICD-10-CM | POA: Insufficient documentation

## 2013-01-08 DIAGNOSIS — R300 Dysuria: Secondary | ICD-10-CM

## 2013-01-08 DIAGNOSIS — Z1151 Encounter for screening for human papillomavirus (HPV): Secondary | ICD-10-CM | POA: Insufficient documentation

## 2013-01-08 DIAGNOSIS — R1031 Right lower quadrant pain: Secondary | ICD-10-CM

## 2013-01-08 LAB — POCT URINALYSIS DIPSTICK
Bilirubin, UA: NEGATIVE
Leukocytes, UA: NEGATIVE
Nitrite, UA: NEGATIVE
Protein, UA: NEGATIVE
Urobilinogen, UA: 0.2
pH, UA: 6.5

## 2013-01-08 MED ORDER — SULFAMETHOXAZOLE-TRIMETHOPRIM 800-160 MG PO TABS: 1.0000 | ORAL_TABLET | Freq: Two times a day (BID) | ORAL | Status: DC

## 2013-01-08 NOTE — Progress Notes (Signed)
  Subjective:    Patient ID: [name], female    DOB: 1966-03-22, 47 y.o.   MRN: [id number]  HPI Patient presents to the clinic with dysuria and need for pap smear.   Pap smear done without complication.partial hysterectomy. Pt request pap smear.   Pt has had recurrent UTI and episodes that feel like UTI for many months. She is seeing an infectious disease doctor in August. She has not gone to pelvic rehab until after seeing ID. Since July 21st it has burned after she urinates. She has had more abdominal pressure and frequency has increased. Denies any fever, chills, or back pain. She does feel like her right groin is swollen and tender. She has had the right groin swell before and the resolve on its on.    Review of Systems     Objective:   Physical Exam  Constitutional: She is oriented to person, place, and time. She appears well-developed and well-nourished.  HENT:  Head: Normocephalic and atraumatic.  Cardiovascular: Normal rate, regular rhythm and normal heart sounds.   Pulmonary/Chest: Effort normal and breath sounds normal.  Abdominal:  Tender to palpation over right groin. Seemed a little puffy also. No tenderness on the left side.   Tenderness to palpation over entire abdomen. More on left than right.  Genitourinary:  No cervix. No uterus. Vaginal swab only done.  Neurological: She is alert and oriented to person, place, and time.  Skin: Skin is warm and dry.  Psychiatric: She has a normal mood and affect. Her behavior is normal.          Assessment & Plan:  Pap smear done will call with result.   Dysuria/abdominal pain/right groin pain- UA positive for blood but negative for leuks and nitrates. Sent for culture.  Pt always has blood in her urine. She has been evaluated by urology. These symptoms raise concerns about IC but per pt cystoscopy has been done and he did not see bladder inflammation. Pt has a hx of me not treating this pain and coming back in within 3  days and developing UTI or culture coming back positive. I went ahead and sent over septra. Will get CBC due to right groin tenderness.discussed with pt could be something that enlarges when infection is present. Keep an eye on lymphnodes. Make aware if do not resolve or worsen. Continue to see pain clinic for pain concerns.  Spent 30 minutes with patient and greater than 50 percent for visit spent talking about treatment plan and ongoing pain.

## 2013-01-08 NOTE — Patient Instructions (Addendum)
Will get culture. Will go ahead and start treatment with bactrim.  Will get CBC today.

## 2013-01-09 LAB — CBC WITH DIFFERENTIAL/PLATELET
Basophils Absolute: 0 10*3/uL (ref 0.0–0.1)
Basophils Relative: 0 % (ref 0–1)
Eosinophils Absolute: 0.3 10*3/uL (ref 0.0–0.7)
MCHC: 32.7 g/dL (ref 30.0–36.0)
Neutro Abs: 3.4 10*3/uL (ref 1.7–7.7)
Neutrophils Relative %: 47 % (ref 43–77)
Platelets: 289 10*3/uL (ref 150–400)
RDW: 14.8 % (ref 11.5–15.5)

## 2013-01-10 LAB — URINE CULTURE
Colony Count: NO GROWTH
Organism ID, Bacteria: NO GROWTH

## 2013-01-26 ENCOUNTER — Other Ambulatory Visit: Payer: Self-pay | Admitting: *Deleted

## 2013-01-26 ENCOUNTER — Telehealth: Payer: Self-pay | Admitting: *Deleted

## 2013-01-26 MED ORDER — CITALOPRAM HYDROBROMIDE 20 MG PO TABS: 20.0000 mg | ORAL_TABLET | Freq: Every morning | ORAL | Status: DC

## 2013-01-26 NOTE — Telephone Encounter (Signed)
Ok for 90 days  

## 2013-01-26 NOTE — Telephone Encounter (Signed)
rx sent for celexa #90 no refills

## 2013-01-26 NOTE — Telephone Encounter (Signed)
Pt left a message asking for a refill on her celexa.  Sent to [facility].

## 2013-01-29 MED ORDER — CITALOPRAM HYDROBROMIDE 20 MG PO TABS: 20.0000 mg | ORAL_TABLET | Freq: Every morning | ORAL | Status: DC

## 2013-02-02 ENCOUNTER — Telehealth: Payer: Self-pay | Admitting: Physician Assistant

## 2013-02-05 ENCOUNTER — Other Ambulatory Visit: Payer: Self-pay | Admitting: Physician Assistant

## 2013-02-05 ENCOUNTER — Telehealth: Payer: Self-pay | Admitting: *Deleted

## 2013-02-05 MED ORDER — LINACLOTIDE 290 MCG PO CAPS: 290.0000 | ORAL_CAPSULE | Freq: Every day | ORAL | Status: DC

## 2013-02-05 NOTE — Telephone Encounter (Signed)
290mg  sent to pharm.  #30 no refills.

## 2013-02-05 NOTE — Telephone Encounter (Signed)
Pt left a message stating that the linzess is working for her but she only has 2 more tabs left of the samples you gave her.  She is asking if you will send her a prescription for it to the [facility] in [place].

## 2013-02-05 NOTE — Telephone Encounter (Signed)
LMOM for pt to call back with correct dosage.

## 2013-02-05 NOTE — Telephone Encounter (Signed)
Ok for script. Confirm dosage whether 145 or 290. Can send to pharmacy.

## 2013-03-14 ENCOUNTER — Encounter: Payer: Self-pay | Admitting: Physician Assistant

## 2013-03-14 ENCOUNTER — Ambulatory Visit (INDEPENDENT_AMBULATORY_CARE_PROVIDER_SITE_OTHER): Payer: BC Managed Care – PPO | Admitting: Physician Assistant

## 2013-03-14 VITALS — BP 105/66 | HR 66 | Wt 142.0 lb

## 2013-03-14 DIAGNOSIS — R300 Dysuria: Secondary | ICD-10-CM

## 2013-03-14 DIAGNOSIS — R3915 Urgency of urination: Secondary | ICD-10-CM

## 2013-03-14 DIAGNOSIS — N390 Urinary tract infection, site not specified: Secondary | ICD-10-CM

## 2013-03-14 LAB — POCT URINALYSIS DIPSTICK
Glucose, UA: NEGATIVE
Nitrite, UA: NEGATIVE
Protein, UA: NEGATIVE
Urobilinogen, UA: 0.2
pH, UA: 6

## 2013-03-14 MED ORDER — CIPROFLOXACIN HCL 500 MG PO TABS: 500.0000 mg | ORAL_TABLET | Freq: Two times a day (BID) | ORAL | Status: DC

## 2013-03-14 NOTE — Progress Notes (Signed)
  Subjective:    Patient ID: [name], female    DOB: September 29, 1965, 47 y.o.   MRN: [id number]  HPI Patient is a 47 year old woman who presents to the clinic with one day of dysuria and urinary frequency. Patient has frequent urinary tract infections. She's is very aware of the symptoms that precede infection. She is seeing urologist for interstitial cystitis. She does daily bladder fluid treatments to help with symptoms. She had been feeling better until this morning. She denies any fever, chills, nausea or vomiting. She does have a lot of lower abdominal pressure. She has not tried anything to make better.   Review of Systems     Objective:   Physical Exam  Constitutional: She appears well-developed and well-nourished.  HENT:  Head: Normocephalic and atraumatic.  Pulmonary/Chest:  NO CVA tenderness.   Abdominal: Soft. Bowel sounds are normal.  LLQ pain with palpation. (ongoing)  Skin: Skin is warm and dry.  Psychiatric: She has a normal mood and affect. Her behavior is normal.          Assessment & Plan:  UTI- UA was positive for blood and leukocytes. I am treating with Cipro for infection today. I will culture to make sure something does grow. Increase water follow up with urologist if symptoms do not resolve.

## 2013-03-17 LAB — URINE CULTURE: Colony Count: >=100000

## 2013-03-19 ENCOUNTER — Other Ambulatory Visit: Payer: Self-pay | Admitting: Physician Assistant

## 2013-03-19 ENCOUNTER — Telehealth: Payer: Self-pay

## 2013-03-19 MED ORDER — SULFAMETHOXAZOLE-TRIMETHOPRIM 800-160 MG PO TABS: 1.0000 | ORAL_TABLET | Freq: Two times a day (BID) | ORAL | Status: DC

## 2013-03-19 NOTE — Telephone Encounter (Signed)
See Urine Culture notes.

## 2013-04-06 ENCOUNTER — Telehealth: Payer: Self-pay | Admitting: *Deleted

## 2013-04-06 NOTE — Telephone Encounter (Signed)
Brittney from [place](?) left message stating that she had faxed forms for limitations for the pt to you & they needed to be signed by a Dr.  Have you received these?

## 2013-04-06 NOTE — Telephone Encounter (Signed)
LMOM informing [facility] that we don't have forms & left fax #.

## 2013-04-06 NOTE — Telephone Encounter (Signed)
No I have not

## 2013-04-13 ENCOUNTER — Encounter: Payer: Self-pay | Admitting: Physician Assistant

## 2013-04-13 DIAGNOSIS — N3011 Interstitial cystitis (chronic) with hematuria: Secondary | ICD-10-CM | POA: Insufficient documentation

## 2013-04-19 ENCOUNTER — Other Ambulatory Visit: Payer: Self-pay

## 2013-04-26 ENCOUNTER — Other Ambulatory Visit: Payer: Self-pay

## 2013-04-26 MED ORDER — ATENOLOL 50 MG PO TABS: ORAL_TABLET | ORAL | Status: DC

## 2013-05-16 ENCOUNTER — Telehealth: Payer: Self-pay | Admitting: *Deleted

## 2013-05-16 NOTE — Telephone Encounter (Signed)
It should increase to 2mg  once daily and see if helps better.

## 2013-05-16 NOTE — Telephone Encounter (Signed)
Pt left message today stating that her hot flashes are "really bad" even though she is on the 1mg  estrodiol.  Shouldn't this medication be helping with hot flashes? Please advise

## 2013-05-17 ENCOUNTER — Other Ambulatory Visit: Payer: Self-pay | Admitting: *Deleted

## 2013-05-17 MED ORDER — ESTRADIOL 1 MG PO TABS: 2.0000 mg | ORAL_TABLET | Freq: Every day | ORAL | Status: DC

## 2013-05-17 NOTE — Telephone Encounter (Signed)
Pt notified to increase to 2mg  & sent refill to pharm.

## 2013-06-27 ENCOUNTER — Other Ambulatory Visit: Payer: Self-pay | Admitting: Physician Assistant

## 2013-06-29 ENCOUNTER — Ambulatory Visit (INDEPENDENT_AMBULATORY_CARE_PROVIDER_SITE_OTHER): Payer: BC Managed Care – PPO | Admitting: Family Medicine

## 2013-06-29 ENCOUNTER — Encounter: Payer: Self-pay | Admitting: Family Medicine

## 2013-06-29 VITALS — BP 90/53 | HR 56 | Wt 146.0 lb

## 2013-06-29 DIAGNOSIS — N200 Calculus of kidney: Secondary | ICD-10-CM

## 2013-06-29 DIAGNOSIS — R319 Hematuria, unspecified: Secondary | ICD-10-CM

## 2013-06-29 LAB — POCT URINALYSIS DIPSTICK
Bilirubin, UA: NEGATIVE
Glucose, UA: NEGATIVE
Ketones, UA: NEGATIVE
Leukocytes, UA: NEGATIVE
Nitrite, UA: NEGATIVE
Protein, UA: NEGATIVE
Spec Grav, UA: <=1.005
Urobilinogen, UA: 0.2
pH, UA: 6

## 2013-06-29 MED ORDER — TAMSULOSIN HCL 0.4 MG PO CAPS: 0.4000 mg | ORAL_CAPSULE | Freq: Every day | ORAL | Status: DC

## 2013-06-29 NOTE — Progress Notes (Signed)
CC: [name] is a 48 y.o. female is here for kidney stones?   Subjective: HPI:  Patient has waxing and waning suprapubic pain with a radiation into her left lower back that has been present for 9 days nothing particularly makes it better or worse it's present all hours of the day interfering with sleep. Is accompanied by foul-smelling urine.  She's taking Bactrim on a daily basis, she gets lidocaine Treatments via a catheter for interstitial cystitis.  She denies any flank pain, fevers, chills, nausea, for abdominal pain other than the above.  Pain is described as a cramping and burning.  Interventions have included Percocet without much benefit she recently saw her pain management specialist earlier today and they increased her opiate regimen. She is unable to reproduce her pain reports urinary frequency but no urgency or dysuria  Review Of Systems Outlined In HPI  Past Medical History  Diagnosis Date  . Urinary calculus or stone   . Gastric ulcer   . Cancer     cervical  . MVP (mitral valve prolapse)   . IBS (irritable bowel syndrome)   . Heart murmur   . Mitral valve prolapse   . Dysrhythmia     pvc   . Anxiety   . Urinary tract infection   . Headache(784.0)   . Colitis     lymphacitic  . Depression      Family History  Problem Relation Age of Onset  . Heart attack Mother   . Cirrhosis Mother   . Cancer Father     tongue  . Cirrhosis Father   . Colon cancer Neg Hx   . Esophageal cancer Neg Hx   . Rectal cancer Neg Hx   . Stomach cancer Neg Hx      History  Substance Use Topics  . Smoking status: Current Every Day Smoker -- 0.25 packs/day for 20 years    Types: Cigarettes  . Smokeless tobacco: Never Used  . Alcohol Use: 0.0 oz/week     Comment: rarely     Objective: Filed Vitals:   06/29/13 1440  BP: 90/53  Pulse: 56    General: Alert and Oriented, No Acute Distress HEENT: Pupils equal, round, reactive to light. Conjunctivae clear.  Moist membranes  pharynx unremarkable Lungs: Clear and comfortable work of breathing Cardiac: Regular rate and rhythm.  Abdomen: She refuses abdominal exam Back: No CVA tenderness bilaterally Extremities: No peripheral edema.  Strong peripheral pulses.  Mental Status: No depression, anxiety, nor agitation. Skin: Warm and dry.  Assessment & Plan: [name] was seen today for kidney stones?.  Diagnoses and associated orders for this visit:  Hematuria - Urinalysis Dipstick - Urine Culture  Nephrolithiasis - tamsulosin (FLOMAX) 0.4 MG CAPS capsule; Take 1 capsule (0.4 mg total) by mouth daily. To help expel kidney stone.    There is a concern for nephrolithiasis given blood in her urine however this is complicated and clouded by the fact she has interstitial cystitis., I encouraged her to have a noncontrast CT scan to confirm kidney stone and to obtain a size however they would prefer not to for insurance reasons, we will culture her urine is a low suspicion from UTI at this point so no antibiotics yet, start Flomax for suspicion of stone and stay well-hydrated she has a urine strainer at home but I encouraged her to use. Followup in one week if no improvement  Return if symptoms worsen or fail to improve.

## 2013-07-01 LAB — URINE CULTURE: Colony Count: 40000

## 2013-07-02 ENCOUNTER — Telehealth: Payer: Self-pay | Admitting: *Deleted

## 2013-07-02 NOTE — Telephone Encounter (Signed)
Pt's husband stopped by & wanted you to know that one time over the wknd [name] used the restroom & when she wiped there was bright red blood. They were thinking maybe she passed a stone.  FYI

## 2013-07-12 ENCOUNTER — Other Ambulatory Visit: Payer: Self-pay | Admitting: *Deleted

## 2013-07-12 MED ORDER — ESTRADIOL 1 MG PO TABS: ORAL_TABLET | ORAL | Status: DC

## 2013-09-28 DIAGNOSIS — F1190 Opioid use, unspecified, uncomplicated: Secondary | ICD-10-CM | POA: Diagnosis present

## 2013-10-10 ENCOUNTER — Ambulatory Visit (INDEPENDENT_AMBULATORY_CARE_PROVIDER_SITE_OTHER): Payer: BC Managed Care – PPO | Admitting: Physician Assistant

## 2013-10-10 ENCOUNTER — Encounter: Payer: Self-pay | Admitting: Physician Assistant

## 2013-10-10 VITALS — BP 116/62 | HR 61 | Temp 98.1°F | Wt 159.0 lb

## 2013-10-10 DIAGNOSIS — R8290 Unspecified abnormal findings in urine: Secondary | ICD-10-CM

## 2013-10-10 DIAGNOSIS — R82998 Other abnormal findings in urine: Secondary | ICD-10-CM

## 2013-10-10 DIAGNOSIS — R17 Unspecified jaundice: Secondary | ICD-10-CM

## 2013-10-10 DIAGNOSIS — N3010 Interstitial cystitis (chronic) without hematuria: Secondary | ICD-10-CM

## 2013-10-10 DIAGNOSIS — R319 Hematuria, unspecified: Secondary | ICD-10-CM

## 2013-10-10 LAB — COMPLETE METABOLIC PANEL WITH GFR
ALBUMIN: 4.3 g/dL (ref 3.5–5.2)
ALT: 34 U/L (ref 0–35)
AST: 29 U/L (ref 0–37)
Alkaline Phosphatase: 64 U/L (ref 39–117)
BILIRUBIN TOTAL: 0.4 mg/dL (ref 0.2–1.2)
BUN: 15 mg/dL (ref 6–23)
CO2: 29 mEq/L (ref 19–32)
Calcium: 9.2 mg/dL (ref 8.4–10.5)
Chloride: 103 mEq/L (ref 96–112)
Creat: 0.79 mg/dL (ref 0.50–1.10)
GFR, EST NON AFRICAN AMERICAN: 89 mL/min
GFR, Est African American: >89 mL/min
GLUCOSE: 80 mg/dL (ref 70–99)
POTASSIUM: 4.6 meq/L (ref 3.5–5.3)
SODIUM: 140 meq/L (ref 135–145)
TOTAL PROTEIN: 6.7 g/dL (ref 6.0–8.3)

## 2013-10-10 LAB — CBC WITH DIFFERENTIAL/PLATELET
BASOS ABS: 0 10*3/uL (ref 0.0–0.1)
BASOS PCT: 0 % (ref 0–1)
Eosinophils Absolute: 0.3 10*3/uL (ref 0.0–0.7)
Eosinophils Relative: 3 % (ref 0–5)
HCT: 40.6 % (ref 36.0–46.0)
Hemoglobin: 13.7 g/dL (ref 12.0–15.0)
Lymphocytes Relative: 23 % (ref 12–46)
Lymphs Abs: 2 10*3/uL (ref 0.7–4.0)
MCH: 30.2 pg (ref 26.0–34.0)
MCHC: 33.7 g/dL (ref 30.0–36.0)
MCV: 89.4 fL (ref 78.0–100.0)
Monocytes Absolute: 0.8 10*3/uL (ref 0.1–1.0)
Monocytes Relative: 9 % (ref 3–12)
NEUTROS ABS: 5.8 10*3/uL (ref 1.7–7.7)
NEUTROS PCT: 65 % (ref 43–77)
Platelets: 301 10*3/uL (ref 150–400)
RBC: 4.54 MIL/uL (ref 3.87–5.11)
RDW: 13.7 % (ref 11.5–15.5)
WBC: 8.9 10*3/uL (ref 4.0–10.5)

## 2013-10-10 LAB — POCT URINALYSIS DIPSTICK
Bilirubin, UA: NEGATIVE
Glucose, UA: NEGATIVE
KETONES UA: NEGATIVE
Leukocytes, UA: NEGATIVE
Nitrite, UA: NEGATIVE
PROTEIN UA: NEGATIVE
SPEC GRAV UA: 1.01
Urobilinogen, UA: 0.2
pH, UA: 7

## 2013-10-10 NOTE — Progress Notes (Signed)
   Subjective:    Patient ID: [name], female    DOB: 26-Aug-1965, 48 y.o.   MRN: [id number]  HPI Pt presents to the clinic with lower abdominal cramping since this morning. Pt has been dx with IC and does daily treatments. She was crying this morning in so much pain. It has improved some. Yesterday UA was done at Urology office. Positive for blood and leukocytes. She was not treated. She is concerned she has a UTI. She did have stimulator for urinary frequency placed 1 week ago. Has significantly decreased her urge and leakage. She see dr. Alona [name] for IC. Denies any fever, chills, nausea, back pain/flank pain.    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 She wonders if there is a test to determine if some of her symptoms could be caused by high levels of formaldehyde.    Review of Systems     Objective:   Physical Exam  Constitutional: She is oriented to person, place, and time. She appears well-developed and well-nourished.  HENT:  Head: Normocephalic and atraumatic.  Eyes:  White sclera. No signs of jaundice.   Cardiovascular: Normal rate, regular rhythm and normal heart sounds.   Pulmonary/Chest: Effort normal and breath sounds normal. She has no wheezes.  No CVA tenderness.  Abdominal:  Bilateral lower abdominal tenderness to palpation. Guarding. No masses or rebound.   Neurological: She is alert and oriented to person, place, and time.  Psychiatric: She has a normal mood and affect. Her behavior is normal.          Assessment & Plan:  IC/lower abdominal pain/cramping- . Results for orders placed in visit on 10/10/13  POCT URINALYSIS DIPSTICK      Result Value Ref Range   Color, UA yellow     Clarity, UA slightly cloudy     Glucose, UA neg     Bilirubin, UA neg     Ketones, UA neg     Spec Grav, UA 1.010     Blood, UA trace-intact     pH, UA 7.0     Protein, UA neg     Urobilinogen, UA 0.2     Nitrite, UA neg     Leukocytes, UA Negative     I do not see any signs of infection today and has improved since yesterday. Will culture. I do think symptoms are consistent with IC flare. Continue treatment for IC. Follow up with urology if pain continues. Call office with fever, chills, n/v/d. Abdominal exam consistent with past exams.  Yellow eyes- pt does not have yellow eyes today. Since pt has noticed will check liver and kidney.   Possible exposure to formaldehyde. Pt continues to look for a cause to all of her pain since dx. She learned that lumbar [employer]ators had a law suit for high formadehyde levels in certain wood floors. She would like to be checked for high levels of formaldehyde. [name] is going to call lab and see what test we could order.

## 2013-10-12 ENCOUNTER — Other Ambulatory Visit: Payer: Self-pay | Admitting: Physician Assistant

## 2013-10-12 MED ORDER — CIPROFLOXACIN HCL 500 MG PO TABS: 500.0000 mg | ORAL_TABLET | Freq: Two times a day (BID) | ORAL | Status: DC

## 2013-10-13 LAB — URINE CULTURE

## 2013-10-15 ENCOUNTER — Other Ambulatory Visit: Payer: Self-pay | Admitting: Physician Assistant

## 2013-10-15 MED ORDER — NITROFURANTOIN MONOHYD MACRO 100 MG PO CAPS: 100.0000 mg | ORAL_CAPSULE | Freq: Two times a day (BID) | ORAL | Status: DC

## 2013-10-30 ENCOUNTER — Encounter: Payer: Self-pay | Admitting: Physician Assistant

## 2013-10-31 ENCOUNTER — Encounter: Payer: Self-pay | Admitting: Physician Assistant

## 2013-11-16 ENCOUNTER — Encounter: Payer: Self-pay | Admitting: Family Medicine

## 2013-11-16 ENCOUNTER — Ambulatory Visit (INDEPENDENT_AMBULATORY_CARE_PROVIDER_SITE_OTHER): Payer: BC Managed Care – PPO | Admitting: Family Medicine

## 2013-11-16 VITALS — BP 97/62 | HR 59 | Temp 98.6°F | Wt 154.0 lb

## 2013-11-16 DIAGNOSIS — N390 Urinary tract infection, site not specified: Secondary | ICD-10-CM

## 2013-11-16 LAB — POCT URINALYSIS DIPSTICK
Bilirubin, UA: NEGATIVE
Glucose, UA: NEGATIVE
Ketones, UA: NEGATIVE
NITRITE UA: NEGATIVE
PROTEIN UA: NEGATIVE
Spec Grav, UA: 1.01
Urobilinogen, UA: 0.2
pH, UA: 6

## 2013-11-16 MED ORDER — LEVOFLOXACIN 500 MG PO TABS: 500.0000 mg | ORAL_TABLET | Freq: Every day | ORAL | Status: DC

## 2013-11-16 MED ORDER — CEFTRIAXONE SODIUM 1 G IJ SOLR
1.0000 g | Freq: Once | INTRAMUSCULAR | Status: AC
  Administered 2013-11-16: 1 g via INTRAMUSCULAR

## 2013-11-16 NOTE — Patient Instructions (Signed)
Another urinary tract infection is confirmed by her culture I would advise you notify Dr. Amalia Hailey that you had a urinary tract infection despite taking nitrofurantoin on a daily basis.

## 2013-11-16 NOTE — Addendum Note (Signed)
Addended by: Isaias Cowman C on: 11/16/2013 02:50 PM   Modules accepted: Orders

## 2013-11-16 NOTE — Addendum Note (Signed)
Addended by: Terance Hart on: 11/16/2013 03:24 PM   Modules accepted: Orders

## 2013-11-16 NOTE — Progress Notes (Signed)
CC: [name] is a 48 y.o. female is here for Urinary Tract Infection   Subjective: HPI:  Complains of dysuria but is localized in the low pelvis at the site of the ureter that is present just prior to urinating and severe for minutes after urinating. It feels like prior urinary tract infections.  It has been present for the past 2-3 days worsening on a daily basis. Nothing particularly makes it better or worse. She's taking nitrofurantoin on a daily basis prescribed by her urologist. She denies fevers, chills, flank pain, nor any other genitourinary complaints.   Review Of Systems Outlined In HPI  Past Medical History  Diagnosis Date  . Urinary calculus or stone   . Gastric ulcer   . Cancer     cervical  . MVP (mitral valve prolapse)   . IBS (irritable bowel syndrome)   . Heart murmur   . Mitral valve prolapse   . Dysrhythmia     pvc   . Anxiety   . Urinary tract infection   . Headache(784.0)   . Colitis     lymphacitic  . Depression     Past Surgical History  Procedure Laterality Date  . Appendectomy  2005  . Partial hysterectomy  2005  . Tubal ligation  2000  . Tonsillectomy  30 years ago  . Dilation and curettage of uterus      x 4  one after each SAB  . Bilateral oophorectomy  2012  . Exploratory laparotomy  2007  . Laparoscopy N/A 08/04/2012    Procedure: LAPAROSCOPY DIAGNOSTIC;  Surgeon: Gayland Curry, MD,FACS;  Location: [facility];  Service: General;  Laterality: N/A;   Family History  Problem Relation Age of Onset  . Heart attack Mother   . Cirrhosis Mother   . Cancer Father     tongue  . Cirrhosis Father   . Colon cancer Neg Hx   . Esophageal cancer Neg Hx   . Rectal cancer Neg Hx   . Stomach cancer Neg Hx     History   Social History  . Marital Status: Married    Spouse Name: N/A    Number of Children: N/A  . Years of Education: N/A   Occupational History  . Not on file.   Social History Main Topics  . Smoking status: Current Every Day  Smoker -- 0.25 packs/day for 20 years    Types: Cigarettes  . Smokeless tobacco: Never Used  . Alcohol Use: 0.0 oz/week     Comment: rarely  . Drug Use: No  . Sexual Activity: Yes    Partners: Male   Other Topics Concern  . Not on file   Social History Narrative  . No narrative on file     Objective: BP 97/62  Pulse 59  Temp(Src) 98.6 F (37 C) (Oral)  Wt 154 lb (69.854 kg)  Vital signs reviewed. General: Alert and Oriented, No Acute Distress HEENT: Pupils equal, round, reactive to light. Conjunctivae clear.  External ears unremarkable.  Moist mucous membranes. Lungs: Clear and comfortable work of breathing, speaking in full sentences without accessory muscle use. Cardiac: Regular rate and rhythm.  Extremities: No peripheral edema.  Strong peripheral pulses.  Back: No CVA tenderness bilaterally Mental Status: No depression, anxiety, nor agitation. Logical though process. Skin: Warm and dry.  Assessment & Plan: [name] was seen today for urinary tract infection.  Diagnoses and associated orders for this visit:  UTI (urinary tract infection) - Urinalysis Dipstick - levofloxacin (  LEVAQUIN) 500 MG tablet; Take 1 tablet (500 mg total) by mouth daily.     urinalysis and story suspicious for UTI therefore start Levaquin , she'll receive a gram of Rocephin here today in the office. If a urinary tract infection is confirmed by culture I have advised her to notify Dr. Amalia Hailey for consideration of changing her daily prophylactic antibiotic.    Return if symptoms worsen or fail to improve.

## 2013-11-20 ENCOUNTER — Telehealth: Payer: Self-pay

## 2013-11-20 LAB — URINE CULTURE: Colony Count: 60000

## 2013-11-20 NOTE — Telephone Encounter (Signed)
Patient's husband stopped by the office to say [name] is still having burning during and after urination. He wants to know if we could do a UA to confirm the infection has resolved. Please advise.

## 2013-11-21 NOTE — Telephone Encounter (Signed)
Patient advised and scheduled for a nurse visit for follow up UA.

## 2013-11-21 NOTE — Telephone Encounter (Signed)
She needs to follow up with urologist so that he can determine if she needs continual therapy. If she cannot leave sample at urologist office we can certainly take here and fax results to his office to let him decide on treatment plan.

## 2013-11-22 ENCOUNTER — Other Ambulatory Visit: Payer: BC Managed Care – PPO | Admitting: *Deleted

## 2013-11-22 ENCOUNTER — Encounter: Payer: Self-pay | Admitting: *Deleted

## 2013-11-22 ENCOUNTER — Other Ambulatory Visit (INDEPENDENT_AMBULATORY_CARE_PROVIDER_SITE_OTHER): Payer: BC Managed Care – PPO | Admitting: Physician Assistant

## 2013-11-22 VITALS — BP 99/58 | HR 60 | Temp 98.5°F | Wt 160.0 lb

## 2013-11-22 DIAGNOSIS — N3010 Interstitial cystitis (chronic) without hematuria: Secondary | ICD-10-CM

## 2013-11-22 NOTE — Progress Notes (Signed)
Pt presented today to have in/out cath done. Specimen collected and tolerated well. She will needto follow up with urologist so that he can determine if she needs continual therapy. If she cannot leave sample at urologist office we can certainly take here and fax results to his office to let him decide on treatment plan.  [facility] [place]

## 2013-11-23 LAB — URINALYSIS, MICROSCOPIC ONLY
Bacteria, UA: NONE SEEN
CRYSTALS: NONE SEEN
Casts: NONE SEEN

## 2013-11-23 NOTE — Progress Notes (Signed)
   Subjective:    Patient ID: [name], female    DOB: [birth year], 48 y.o.   MRN: [id number]  HPI    Review of Systems     Objective:   Physical Exam        Assessment & Plan:  .[name] Results for orders placed in visit on 11/22/13  URINALYSIS, MICROSCOPIC ONLY      Result Value Ref Range   Squamous Epithelial / LPF RARE  RARE   Crystals NONE SEEN  NONE SEEN   Casts NONE SEEN  NONE SEEN   WBC, UA 0-2  <3 WBC/hpf   RBC / HPF 7-10 (*) <3 RBC/hpf   Bacteria, UA NONE SEEN  RARE     Will send results to urologist.

## 2013-11-24 LAB — URINE CULTURE
Colony Count: NO GROWTH
ORGANISM ID, BACTERIA: NO GROWTH

## 2013-12-20 ENCOUNTER — Encounter: Payer: Self-pay | Admitting: Emergency Medicine

## 2013-12-20 ENCOUNTER — Emergency Department (INDEPENDENT_AMBULATORY_CARE_PROVIDER_SITE_OTHER): Payer: BC Managed Care – PPO

## 2013-12-20 ENCOUNTER — Emergency Department (INDEPENDENT_AMBULATORY_CARE_PROVIDER_SITE_OTHER)
Admission: EM | Admit: 2013-12-20 | Discharge: 2013-12-20 | Disposition: A | Discharge status: Home / Self Care | Payer: BC Managed Care – PPO | Source: Home / Self Care | Attending: Emergency Medicine | Admitting: Emergency Medicine

## 2013-12-20 ENCOUNTER — Ambulatory Visit (INDEPENDENT_AMBULATORY_CARE_PROVIDER_SITE_OTHER): Payer: BC Managed Care – PPO | Admitting: Sports Medicine

## 2013-12-20 DIAGNOSIS — M25569 Pain in unspecified knee: Secondary | ICD-10-CM

## 2013-12-20 DIAGNOSIS — M7651 Patellar tendinitis, right knee: Secondary | ICD-10-CM

## 2013-12-20 DIAGNOSIS — M25561 Pain in right knee: Secondary | ICD-10-CM

## 2013-12-20 DIAGNOSIS — M7650 Patellar tendinitis, unspecified knee: Secondary | ICD-10-CM

## 2013-12-20 DIAGNOSIS — M1711 Unilateral primary osteoarthritis, right knee: Secondary | ICD-10-CM | POA: Insufficient documentation

## 2013-12-20 MED ORDER — NITROGLYCERIN 0.2 MG/HR TD PT24: MEDICATED_PATCH | TRANSDERMAL | Status: DC

## 2013-12-20 NOTE — ED Provider Notes (Signed)
CSN: [id number]     Arrival date & time 12/20/13  7829 History   First MD Initiated Contact with Patient 12/20/13 1006     Chief Complaint  Patient presents with  . Knee Pain   (Consider location/radiation/quality/duration/timing/severity/associated sxs/prior Treatment) HPI Right knee pain for the last 2-3 days.  She rates it as 10 out of 10 sharp, intermittent, worse with bending, better with straightening and an Ace bandage.  Minimal pain as 5/10.  No swelling.  No known injury, falls, tripping.  She states it began in her right knee but now is starting to have some symptoms in her left knee as well.  No known history of arthritis and mom had rheumatoid arthritis.  Past Medical History  Diagnosis Date  . Urinary calculus or stone   . Gastric ulcer   . Cancer     cervical  . MVP (mitral valve prolapse)   . IBS (irritable bowel syndrome)   . Heart murmur   . Mitral valve prolapse   . Dysrhythmia     pvc   . Anxiety   . Urinary tract infection   . Headache(784.0)   . Colitis     lymphacitic  . Depression    Past Surgical History  Procedure Laterality Date  . Appendectomy  2005  . Partial hysterectomy  2005  . Tubal ligation  2000  . Tonsillectomy  30 years ago  . Dilation and curettage of uterus      x 4  one after each SAB  . Bilateral oophorectomy  2012  . Exploratory laparotomy  2007  . Laparoscopy N/A 08/04/2012    Procedure: LAPAROSCOPY DIAGNOSTIC;  Surgeon: Gayland Curry, MD,FACS;  Location: [facility];  Service: General;  Laterality: N/A;   Family History  Problem Relation Age of Onset  . Heart attack Mother   . Cirrhosis Mother   . Cancer Father     tongue  . Cirrhosis Father   . Colon cancer Neg Hx   . Esophageal cancer Neg Hx   . Rectal cancer Neg Hx   . Stomach cancer Neg Hx    History  Substance Use Topics  . Smoking status: Current Every Day Smoker -- 0.50 packs/day for 25 years    Types: Cigarettes  . Smokeless tobacco: Never Used  . Alcohol Use: 0.0  oz/week     Comment: rarely   OB History   Grav Para Term Preterm Abortions TAB SAB Ect Mult Living   5 2 2       2      Review of Systems  All other systems reviewed and are negative.   Allergies  Ambien and Amitriptyline  Home Medications   Prior to Admission medications   Medication Sig Start Date End Date Taking? Authorizing Provider  ALPRAZolam Duanne Moron) 0.5 MG tablet Take 0.5 mg by mouth as needed for anxiety.  03/21/12   Hali Marry, MD  atenolol (TENORMIN) 50 MG tablet Take 1/2 tablet daily 04/26/13   Lelon Perla, MD  citalopram (CELEXA) 20 MG tablet Take 40 mg by mouth every morning. 01/29/13   Jade L Breeback, PA-C  dexlansoprazole (DEXILANT) 60 MG capsule Take 60 mg by mouth every morning.    Historical Provider, MD  estradiol (ESTRACE) 1 MG tablet TAKE TWO TABLETS BY MOUTH ONCE DAILY WITH SUPPER 07/12/13   Jade L Breeback, PA-C  Evening Primrose Oil 1000 MG CAPS Take 1,000 mg by mouth 3 (three) times daily.    Historical Provider,  MD  fentaNYL (DURAGESIC - DOSED MCG/HR) 75 MCG/HR Place 100 mcg onto the skin every other day.     Historical Provider, MD  gabapentin (NEURONTIN) 100 MG capsule Take 100 mg by mouth 3 (three) times daily.  11/12/12   Historical Provider, MD  hydrOXYzine (ATARAX/VISTARIL) 25 MG tablet Take 25 mg by mouth 3 (three) times daily as needed.    Historical Provider, MD  levofloxacin (LEVAQUIN) 500 MG tablet Take 1 tablet (500 mg total) by mouth daily. 11/16/13   Sean Hommel, DO  lidocaine (LIDODERM) 5 % Place 1 patch onto the skin daily as needed (For pain.).  09/25/12   Donella Stade, PA-C  oxyCODONE-acetaminophen (ROXICET) 5-325 MG per tablet Take 1-2 tablets by mouth every 4 (four) hours as needed for pain. May start taking this if run out of current home supply of percocet 08/04/12   Gayland Curry, MD  pentosan polysulfate (ELMIRON) 100 MG capsule Take 100 mg by mouth 2 (two) times daily. Take two tablets by mouth twice a day    Historical  Provider, MD  Probiotic Product (PROBIOTIC DAILY PO) Take 1 capsule by mouth 2 (two) times daily.    Historical Provider, MD   BP 100/69  Pulse 62  Temp(Src) 98.2 F (36.8 C) (Oral)  Ht 5\' 6"  (1.676 m)  Wt 161 lb (73.029 kg)  BMI 26.00 kg/m2  SpO2 100% Physical Exam  Nursing note and vitals reviewed. Constitutional: She is oriented to person, place, and time. She appears well-developed and well-nourished.  HENT:  Head: Normocephalic and atraumatic.  Eyes: No scleral icterus.  Neck: Neck supple.  Cardiovascular: Regular rhythm and normal heart sounds.   Pulmonary/Chest: Effort normal and breath sounds normal. No respiratory distress.  Musculoskeletal:  R knee: Full range of motion but pain with flexion, no effusion, no ecchymoses, Lachmans normal, Anterior & posterior drawer normal, McMurrays normal, Varus & valgus stress normal.  Patella freely mobile, Clarks compression test normal.  Good alignment. Distal neurovascular status is intact.  Visual tenderness to palpation along the patellar tendon as well as the inferior patellar pole.   Neurological: She is alert and oriented to person, place, and time.  Skin: Skin is warm and dry.  Psychiatric: She has a normal mood and affect. Her speech is normal.    ED Course  Procedures (including critical care time) Labs Review Labs Reviewed - No data to display  Imaging Review Dg Knee Ap/lat W/sunrise Right  12/20/2013   CLINICAL DATA:  Medial knee pain.  No history of injury.  EXAM: DG KNEE - 3 VIEWS  COMPARISON:  None.  FINDINGS: There is no evidence of fracture, dislocation, or joint effusion. There is no evidence of arthropathy or other focal bone abnormality. Soft tissues are unremarkable.  IMPRESSION: Negative.   Electronically Signed   By: Misty Stanley M.D.   On: 12/20/2013 10:27     MDM   1. Right knee pain    An x-ray is obtained and read by the radiologist as above.  Patient likely with a patellar tendinitis.  She is asking  for an injection as well as ultrasound scans on consult Dr. Darene Lamer. next door who will come over and do that for her.  Treatment will be based on his recommendations and followup will be with him at [place] he works in the office that her primary care physician works in.  She is already on chronic pain management.Janeann Forehand, MD 12/20/13 1049

## 2013-12-20 NOTE — Assessment & Plan Note (Signed)
Nitroglycerin patches, patellar strap, formal physical therapy. X-rays were negative, she did have some malacia of the patellar tendon with hypoechoic change on ultrasound. Return to see me in 6 weeks.

## 2013-12-20 NOTE — ED Notes (Signed)
Rt knee pain x 2 days,10/10, left knee pain 2 days, 5/10

## 2013-12-20 NOTE — Progress Notes (Signed)
   Subjective:    I'm seeing this patient as a consultation for:  Dr. Koleen Nimrod  CC: Right knee pain  HPI: This is a pleasant 48 year old female, for the past several weeks she has had pain in the anterior aspect of the knee at the inferior pole of the patella, no trauma. No major changes in physical activity, pain is moderate, persistent without radiation.  Past medical history, Surgical history, Family history not pertinant except as noted below, Social history, Allergies, and medications have been entered into the medical record, reviewed, and no changes needed.   Review of Systems: No headache, visual changes, nausea, vomiting, diarrhea, constipation, dizziness, abdominal pain, skin rash, fevers, chills, night sweats, weight loss, swollen lymph nodes, body aches, joint swelling, muscle aches, chest pain, shortness of breath, mood changes, visual or auditory hallucinations.   Objective:   General: Well Developed, well nourished, and in no acute distress.  Neuro/Psych: Alert and oriented x3, extra-ocular muscles intact, able to move all 4 extremities, sensation grossly intact. Skin: Warm and dry, no rashes noted.  Respiratory: Not using accessory muscles, speaking in full sentences, trachea midline.  Cardiovascular: Pulses palpable, no extremity edema. Abdomen: Does not appear distended. Right Knee: Normal to inspection with no erythema or effusion or obvious bony abnormalities. Palpation normal with no warmth or joint line tenderness or patellar tenderness or condyle tenderness. Tender to palpation at the inferior pole of the patella over the patellar tendon. ROM normal in flexion and extension and lower leg rotation. Ligaments with solid consistent endpoints including ACL, PCL, LCL, MCL. Negative Mcmurray's and provocative meniscal tests. Non painful patellar compression. Patellar and quadriceps tendons unremarkable. Hamstring and quadriceps strength is normal.  Impression and  Recommendations:   This case required medical decision making of moderate complexity.

## 2013-12-22 ENCOUNTER — Telehealth: Payer: Self-pay | Admitting: Emergency Medicine

## 2014-02-05 ENCOUNTER — Telehealth: Payer: Self-pay | Admitting: *Deleted

## 2014-02-05 NOTE — Telephone Encounter (Signed)
Pt left vm stating that she has another UTI.  She has the burning, frequency, & urgency.  She said she'd be fine with giving a sample but she knows she has one.  They're leaving for the beach fri & wanted to be on something before they left.

## 2014-02-05 NOTE — Telephone Encounter (Signed)
We do need sample. Come anytime.

## 2014-02-06 ENCOUNTER — Ambulatory Visit (INDEPENDENT_AMBULATORY_CARE_PROVIDER_SITE_OTHER): Payer: BC Managed Care – PPO | Admitting: Physician Assistant

## 2014-02-06 DIAGNOSIS — N390 Urinary tract infection, site not specified: Secondary | ICD-10-CM

## 2014-02-06 DIAGNOSIS — R300 Dysuria: Secondary | ICD-10-CM

## 2014-02-06 DIAGNOSIS — R3915 Urgency of urination: Secondary | ICD-10-CM

## 2014-02-06 DIAGNOSIS — R350 Frequency of micturition: Secondary | ICD-10-CM

## 2014-02-06 DIAGNOSIS — R319 Hematuria, unspecified: Principal | ICD-10-CM

## 2014-02-06 LAB — POCT URINALYSIS DIPSTICK
BILIRUBIN UA: NEGATIVE
Glucose, UA: NEGATIVE
NITRITE UA: NEGATIVE
PH UA: 6
Spec Grav, UA: 1.025
Urobilinogen, UA: 0.2

## 2014-02-06 MED ORDER — NITROFURANTOIN MONOHYD MACRO 100 MG PO CAPS: 100.0000 mg | ORAL_CAPSULE | Freq: Two times a day (BID) | ORAL | Status: DC

## 2014-02-06 NOTE — Progress Notes (Signed)
   Subjective:    Patient ID: [name], female    DOB: [birth year], 48 y.o.   MRN: [id number] Pt ran in to drop off a urine sample for a UA.  She is having lots of dysuria, urgency, and frequency.  She left before any vitals could be taken.  Beatris Ship, CMA  HPI    Review of Systems     Objective:   Physical Exam        Assessment & Plan:  UTI symptoms- .[name]  Hx of UTI symptoms.   Results for orders placed in visit on 02/06/14  POCT URINALYSIS DIPSTICK      Result Value Ref Range   Color, UA [name]     Clarity, UA cloudy     Glucose, UA neg     Bilirubin, UA neg     Ketones, UA trace     Spec Grav, UA 1.025     Blood, UA moderate     pH, UA 6.0     Protein, UA trace     Urobilinogen, UA 0.2     Nitrite, UA neg     Leukocytes, UA Trace     Will culture.  Treated based on last culture with macrobid for 7 days.  Follow up if not improving or if worsening.

## 2014-02-08 LAB — URINE CULTURE: Colony Count: 3000

## 2014-02-20 ENCOUNTER — Ambulatory Visit (INDEPENDENT_AMBULATORY_CARE_PROVIDER_SITE_OTHER): Payer: BC Managed Care – PPO | Admitting: Physician Assistant

## 2014-02-20 ENCOUNTER — Encounter: Payer: Self-pay | Admitting: Physician Assistant

## 2014-02-20 VITALS — BP 104/70 | HR 67 | Ht 66.0 in | Wt 161.0 lb

## 2014-02-20 DIAGNOSIS — R253 Fasciculation: Secondary | ICD-10-CM

## 2014-02-20 DIAGNOSIS — R259 Unspecified abnormal involuntary movements: Secondary | ICD-10-CM | POA: Diagnosis not present

## 2014-02-20 NOTE — Progress Notes (Signed)
   Subjective:    Patient ID: [name], female    DOB: 02-Sep-1965, 48 y.o.   MRN: [id number]  HPI Pt presents to the clinic with upper extremity muscle twitching. Husband states twitching has been going on for months but pt only feels like symptoms present for last couple of weeks. Only in upper extremities and mostly hands. Never noticed in legs. She will be going through the day and feel twitching in her bilateral hands. No pain. No vision changes. She does feel fatigued but that is ongoing. She has dropped items after twitch. No numbness or tingling of extermities. No neck pain. Not tried anything to make better. Husband feels like fentanyl pain patch could be culprit. Has made appt with pain clinic to decrease dose to see if symptoms get better.     Review of Systems  All other systems reviewed and are negative.      Objective:   Physical Exam  Constitutional: She is oriented to person, place, and time. She appears well-developed and well-nourished.  HENT:  Head: Normocephalic and atraumatic.  Right Ear: External ear normal.  Left Ear: External ear normal.  Nose: Nose normal.  Mouth/Throat: Oropharynx is clear and moist.  Eyes: Conjunctivae and EOM are normal. Pupils are equal, round, and reactive to light. Right eye exhibits no discharge. Left eye exhibits no discharge.  Neck: Normal range of motion. Neck supple.  Cardiovascular: Normal rate, regular rhythm and normal heart sounds.   Pulmonary/Chest: Effort normal and breath sounds normal. She has no wheezes.  Lymphadenopathy:    She has no cervical adenopathy.  Neurological: She is alert and oriented to person, place, and time. No cranial nerve deficit.  hyperreflexive but symmetric 3+.   Skin: Skin is dry.  Psychiatric: She has a normal mood and affect. Her behavior is normal.          Assessment & Plan:  Muscle twitching- will get labs to evaluate for any metabolic cause. If normal will proceed with further  testing and possible imaging. Neurological exam essentially normal. Husband feels like could be fentanly patch. She is working with pain clinic to decrease dose and see if symptoms improve. No medciation added at this time.

## 2014-02-21 LAB — COMPLETE METABOLIC PANEL WITH GFR
ALT: 31 U/L (ref 0–35)
AST: 27 U/L (ref 0–37)
Albumin: 4.4 g/dL (ref 3.5–5.2)
Alkaline Phosphatase: 58 U/L (ref 39–117)
BUN: 18 mg/dL (ref 6–23)
CALCIUM: 10 mg/dL (ref 8.4–10.5)
CHLORIDE: 101 meq/L (ref 96–112)
CO2: 28 mEq/L (ref 19–32)
CREATININE: 0.74 mg/dL (ref 0.50–1.10)
Glucose, Bld: 103 mg/dL — ABNORMAL HIGH (ref 70–99)
Potassium: 4.4 mEq/L (ref 3.5–5.3)
SODIUM: 137 meq/L (ref 135–145)
TOTAL PROTEIN: 6.7 g/dL (ref 6.0–8.3)
Total Bilirubin: 0.4 mg/dL (ref 0.2–1.2)

## 2014-02-21 LAB — FOLATE: Folate: >20 ng/mL

## 2014-02-21 LAB — ANA: ANA: NEGATIVE

## 2014-02-21 LAB — SEDIMENTATION RATE: Sed Rate: 4 mm/hr (ref 0–22)

## 2014-02-21 LAB — MAGNESIUM: Magnesium: 1.7 mg/dL (ref 1.5–2.5)

## 2014-02-21 LAB — CK: Total CK: 59 U/L (ref 7–177)

## 2014-02-21 LAB — TSH: TSH: 0.516 u[IU]/mL (ref 0.350–4.500)

## 2014-02-21 LAB — VITAMIN B12: Vitamin B-12: 536 pg/mL (ref 211–911)

## 2014-02-24 ENCOUNTER — Other Ambulatory Visit: Payer: Self-pay | Admitting: Physician Assistant

## 2014-02-24 DIAGNOSIS — R253 Fasciculation: Secondary | ICD-10-CM

## 2014-02-25 ENCOUNTER — Telehealth: Payer: Self-pay | Admitting: *Deleted

## 2014-02-25 NOTE — Telephone Encounter (Signed)
CT approval obtained via [employer] for head scan. Josem Kaufmann is [id number] and expires 04/25/14. Margette Fast, CMA

## 2014-02-26 ENCOUNTER — Ambulatory Visit (INDEPENDENT_AMBULATORY_CARE_PROVIDER_SITE_OTHER): Payer: BC Managed Care – PPO

## 2014-02-26 DIAGNOSIS — R259 Unspecified abnormal involuntary movements: Secondary | ICD-10-CM

## 2014-02-26 DIAGNOSIS — R253 Fasciculation: Secondary | ICD-10-CM

## 2014-02-26 DIAGNOSIS — R51 Headache: Secondary | ICD-10-CM

## 2014-04-15 ENCOUNTER — Encounter: Payer: Self-pay | Admitting: Physician Assistant

## 2014-04-24 ENCOUNTER — Other Ambulatory Visit: Payer: Self-pay | Admitting: *Deleted

## 2014-04-24 MED ORDER — ESTRADIOL 1 MG PO TABS: ORAL_TABLET | ORAL | Status: DC

## 2014-04-26 MED ORDER — ESTRADIOL 1 MG PO TABS: ORAL_TABLET | ORAL | Status: DC

## 2014-06-12 ENCOUNTER — Telehealth: Payer: Self-pay

## 2014-06-12 NOTE — Telephone Encounter (Signed)
[place] called and left a message stating her Urologist wants [place] to do a sterile urine catheter urine culture. I called and left message for a return call.

## 2014-06-12 NOTE — Telephone Encounter (Signed)
Left message for patient to return call.

## 2014-06-12 NOTE — Telephone Encounter (Signed)
tonya as done this previously. If we have a nurse that can capture get on nurse visit and will send results to urology.

## 2014-06-24 ENCOUNTER — Other Ambulatory Visit: Payer: Self-pay | Admitting: *Deleted

## 2014-06-24 MED ORDER — ESTRADIOL 1 MG PO TABS: ORAL_TABLET | ORAL | Status: DC

## 2014-06-26 ENCOUNTER — Encounter: Payer: Self-pay | Admitting: Physician Assistant

## 2014-06-26 ENCOUNTER — Ambulatory Visit (INDEPENDENT_AMBULATORY_CARE_PROVIDER_SITE_OTHER): Payer: BLUE CROSS/BLUE SHIELD | Admitting: Physician Assistant

## 2014-06-26 VITALS — BP 95/56 | HR 76 | Wt 164.0 lb

## 2014-06-26 DIAGNOSIS — R32 Unspecified urinary incontinence: Secondary | ICD-10-CM | POA: Insufficient documentation

## 2014-06-26 DIAGNOSIS — R319 Hematuria, unspecified: Secondary | ICD-10-CM | POA: Diagnosis not present

## 2014-06-26 DIAGNOSIS — R1032 Left lower quadrant pain: Secondary | ICD-10-CM

## 2014-06-26 DIAGNOSIS — G8929 Other chronic pain: Secondary | ICD-10-CM

## 2014-06-26 DIAGNOSIS — R1031 Right lower quadrant pain: Secondary | ICD-10-CM | POA: Diagnosis not present

## 2014-06-26 DIAGNOSIS — N393 Stress incontinence (female) (male): Secondary | ICD-10-CM

## 2014-06-26 DIAGNOSIS — N3010 Interstitial cystitis (chronic) without hematuria: Secondary | ICD-10-CM

## 2014-06-26 LAB — POCT URINALYSIS DIPSTICK
Bilirubin, UA: NEGATIVE
Glucose, UA: NEGATIVE
Ketones, UA: NEGATIVE
Leukocytes, UA: NEGATIVE
Nitrite, UA: NEGATIVE
PROTEIN UA: NEGATIVE
SPEC GRAV UA: 1.01
UROBILINOGEN UA: 0.2
pH, UA: 6

## 2014-06-26 NOTE — Progress Notes (Signed)
   Subjective:    Patient ID: [name], female    DOB: [birth year], 49 y.o.   MRN: [id number]  HPI  Patient presents to the clinic today with worsening left lower abdominal pain. She has a history of IC/incontinence/chronic lower abdominal pain. She often cannot tell if she is having a flare of IC or urinary tract infection. She would like to make sure today. She denies any fever, chills. She does have painful urination. She recently did increase her instillations via her urologist to twice a day. This does not seem to be helping. She is also in pain management. They recently did take her off the fentanyl patch and she is now on Suboxone. She does not feel like Suboxone is helping at all with her pain. She reports she is in pain every day. She feels like she cannot go on like this.    Review of Systems  All other systems reviewed and are negative.      Objective:   Physical Exam  Constitutional: She is oriented to person, place, and time. She appears well-developed and well-nourished.  HENT:  Head: Normocephalic and atraumatic.  Cardiovascular: Normal rate, regular rhythm and normal heart sounds.   Pulmonary/Chest:  No CVA tenderness.   Abdominal: Soft. Bowel sounds are normal.  Guarding over left lower quadrant. Pain started crying with light palpation.   Neurological: She is alert and oriented to person, place, and time.  Skin: Skin is dry.  Psychiatric: She has a normal mood and affect. Her behavior is normal.          Assessment & Plan:  Acute left lower quadrant pain/interstitial cystitis/chronic bilateral lower abdominal pain/incontinence- she came in today to make sure she did not have a urinary tract infection that could be worsening the pain... Results for orders placed or performed in visit on 06/26/14  POCT urinalysis dipstick  Result Value Ref Range   Color, UA dark yellow    Clarity, UA cloudy    Glucose, UA neg    Bilirubin, UA neg    Ketones, UA neg    Spec Grav, UA 1.010    Blood, UA moderate    pH, UA 6.0    Protein, UA neg    Urobilinogen, UA 0.2    Nitrite, UA neg    Leukocytes, UA Negative    UA was negative for anything except blood. We will culture. Discuss with patient this is likely an IC flare. Was also quite possible that her recent adjustments to pain medicine is not working as well. She does have a follow-up appointment with her pain management on January 21 and with her urologist on January 27. She does bring in disability paperwork to have filled out. I did spent time filling out disability paperwork for patient. Reassurance was given today but no infection appears to be present. We will call with culture results.

## 2014-06-28 LAB — URINE CULTURE
Colony Count: NO GROWTH
ORGANISM ID, BACTERIA: NO GROWTH

## 2014-07-10 ENCOUNTER — Telehealth: Payer: Self-pay | Admitting: *Deleted

## 2014-07-10 DIAGNOSIS — G894 Chronic pain syndrome: Secondary | ICD-10-CM

## 2014-07-10 NOTE — Telephone Encounter (Signed)
Ok to send referral  

## 2014-07-10 NOTE — Telephone Encounter (Signed)
Pt left vm stating that she wants to leave pain management & transfer over to [facility] here in [place]. Are you ok with me sending the referral?

## 2014-07-10 NOTE — Telephone Encounter (Signed)
Referral placed for [facility].

## 2014-09-05 ENCOUNTER — Ambulatory Visit (INDEPENDENT_AMBULATORY_CARE_PROVIDER_SITE_OTHER): Payer: BLUE CROSS/BLUE SHIELD

## 2014-09-05 ENCOUNTER — Encounter: Payer: Self-pay | Admitting: Family Medicine

## 2014-09-05 ENCOUNTER — Ambulatory Visit (INDEPENDENT_AMBULATORY_CARE_PROVIDER_SITE_OTHER): Payer: BLUE CROSS/BLUE SHIELD | Admitting: Family Medicine

## 2014-09-05 VITALS — BP 103/70 | HR 73 | Wt 159.0 lb

## 2014-09-05 DIAGNOSIS — Z90722 Acquired absence of ovaries, bilateral: Secondary | ICD-10-CM

## 2014-09-05 DIAGNOSIS — R300 Dysuria: Secondary | ICD-10-CM | POA: Diagnosis not present

## 2014-09-05 DIAGNOSIS — Z9049 Acquired absence of other specified parts of digestive tract: Secondary | ICD-10-CM | POA: Diagnosis not present

## 2014-09-05 DIAGNOSIS — R109 Unspecified abdominal pain: Secondary | ICD-10-CM

## 2014-09-05 DIAGNOSIS — Z8719 Personal history of other diseases of the digestive system: Secondary | ICD-10-CM | POA: Diagnosis not present

## 2014-09-05 LAB — POCT URINALYSIS DIPSTICK
Bilirubin, UA: NEGATIVE
Glucose, UA: NEGATIVE
Ketones, UA: NEGATIVE
Leukocytes, UA: NEGATIVE
Nitrite, UA: NEGATIVE
Protein, UA: NEGATIVE
UROBILINOGEN UA: 0.2
pH, UA: 6

## 2014-09-05 MED ORDER — NITROFURANTOIN MONOHYD MACRO 100 MG PO CAPS: 100.0000 mg | ORAL_CAPSULE | Freq: Two times a day (BID) | ORAL | Status: AC

## 2014-09-05 NOTE — Progress Notes (Signed)
CC: [name] is a 49 y.o. female is here for Acute Visit   Subjective: HPI:  1 week of dysuria localized in the low pelvis that radiates into the left flank that has been present with all urinations/catheterizations. Pain is described as a burning mild to moderate in severity. Improves if she is lying on the couch in a fetal position rocking left and right. Nothing else seems to make it better or worse. She gave herself a "treatment of bicarbonate, heparin and one other ingredient" that's been beneficial in the past and prescribed by her urologist to be delivered into the bladder through catheterization. However over the past few days this treatment has not been effective at helping with discomfort. No other interventions as of yet. Denies any change in odor, or consistency of her urine. No new bowel habit changes. Denies fevers, chills, nausea, new back pain. No other genitourinary complaints   Review Of Systems Outlined In HPI  Past Medical History  Diagnosis Date  . Urinary calculus or stone   . Gastric ulcer   . Cancer     cervical  . MVP (mitral valve prolapse)   . IBS (irritable bowel syndrome)   . Heart murmur   . Mitral valve prolapse   . Dysrhythmia     pvc   . Anxiety   . Urinary tract infection   . Headache(784.0)   . Colitis     lymphacitic  . Depression     Past Surgical History  Procedure Laterality Date  . Appendectomy  2005  . Partial hysterectomy  2005  . Tubal ligation  2000  . Tonsillectomy  30 years ago  . Dilation and curettage of uterus      x 4  one after each SAB  . Bilateral oophorectomy  2012  . Exploratory laparotomy  2007  . Laparoscopy N/A 08/04/2012    Procedure: LAPAROSCOPY DIAGNOSTIC;  Surgeon: Gayland Curry, MD,FACS;  Location: [facility];  Service: General;  Laterality: N/A;   Family History  Problem Relation Age of Onset  . Heart attack Mother   . Cirrhosis Mother   . Cancer Father     tongue  . Cirrhosis Father   . Colon cancer  Neg Hx   . Esophageal cancer Neg Hx   . Rectal cancer Neg Hx   . Stomach cancer Neg Hx     History   Social History  . Marital Status: Married    Spouse Name: N/A  . Number of Children: N/A  . Years of Education: N/A   Occupational History  . Not on file.   Social History Main Topics  . Smoking status: Current Every Day Smoker -- 0.50 packs/day for 25 years    Types: Cigarettes  . Smokeless tobacco: Never Used  . Alcohol Use: 0.0 oz/week     Comment: rarely  . Drug Use: No  . Sexual Activity:    Partners: Male   Other Topics Concern  . Not on file   Social History Narrative     Objective: BP 103/70 mmHg  Pulse 73  Wt 159 lb (72.122 kg)  SpO2 100%  Vital signs reviewed. General: Alert and Oriented, No Acute Distress HEENT: Pupils equal, round, reactive to light. Conjunctivae clear.  External ears unremarkable.  Moist mucous membranes. Lungs: Clear and comfortable work of breathing, speaking in full sentences without accessory muscle use. Cardiac: Regular rate and rhythm.  Abdomen: No reproducible left lower quadrant pain, no CVA tenderness on the left.  Extremities: No peripheral edema.  Strong peripheral pulses.  Mental Status: No depression, anxiety, nor agitation. Logical though process. Skin: Warm and dry.  Assessment & Plan: [name] was seen today for acute visit.  Diagnoses and all orders for this visit:  Left flank pain Orders: -     DG Abd 2 Views; Future -     POCT urinalysis dipstick  Dysuria Orders: -     DG Abd 2 Views; Future -     POCT urinalysis dipstick -     Urine culture  Other orders -     nitrofurantoin, macrocrystal-monohydrate, (MACROBID) 100 MG capsule; Take 1 capsule (100 mg total) by mouth 2 (two) times daily.   Urinalysis is unremarkable other than blood however almost always has blood in her urinalysis. Obtaining flat plates of her abdomen to rule out calcified kidney stone, fortunately these were unremarkable therefore will  treat with Macrobid pending urine culture, she already has follow-up with her urologist on Tuesday.  Return if symptoms worsen or fail to improve.

## 2014-09-05 NOTE — Progress Notes (Signed)
Called Patient to inform that : xray did not show any sign of a stone. While her urine culture is pending, would recommend she start on a antibiotic that I sent her [facility] called Macrobid. I will make sure she gets updated once the urine culture is available.

## 2014-09-06 LAB — URINE CULTURE
COLONY COUNT: NO GROWTH
Organism ID, Bacteria: NO GROWTH

## 2015-02-06 ENCOUNTER — Ambulatory Visit (INDEPENDENT_AMBULATORY_CARE_PROVIDER_SITE_OTHER): Payer: BLUE CROSS/BLUE SHIELD | Admitting: Family Medicine

## 2015-02-06 ENCOUNTER — Encounter: Payer: Self-pay | Admitting: Family Medicine

## 2015-02-06 VITALS — BP 105/66 | HR 73 | Temp 98.5°F | Wt 175.0 lb

## 2015-02-06 DIAGNOSIS — R197 Diarrhea, unspecified: Secondary | ICD-10-CM

## 2015-02-06 DIAGNOSIS — G2581 Restless legs syndrome: Secondary | ICD-10-CM

## 2015-02-06 LAB — CBC WITH DIFFERENTIAL/PLATELET
Basophils Absolute: 0 10*3/uL (ref 0.0–0.1)
Basophils Relative: 0 % (ref 0–1)
Eosinophils Absolute: 0.3 10*3/uL (ref 0.0–0.7)
Eosinophils Relative: 3 % (ref 0–5)
HCT: 38.9 % (ref 36.0–46.0)
Hemoglobin: 12.8 g/dL (ref 12.0–15.0)
LYMPHS ABS: 2.7 10*3/uL (ref 0.7–4.0)
LYMPHS PCT: 28 % (ref 12–46)
MCH: 30.8 pg (ref 26.0–34.0)
MCHC: 32.9 g/dL (ref 30.0–36.0)
MCV: 93.5 fL (ref 78.0–100.0)
MONO ABS: 0.9 10*3/uL (ref 0.1–1.0)
MONOS PCT: 9 % (ref 3–12)
MPV: 8.7 fL (ref 8.6–12.4)
NEUTROS ABS: 5.8 10*3/uL (ref 1.7–7.7)
Neutrophils Relative %: 60 % (ref 43–77)
PLATELETS: 349 10*3/uL (ref 150–400)
RBC: 4.16 MIL/uL (ref 3.87–5.11)
RDW: 14.9 % (ref 11.5–15.5)
WBC: 9.7 10*3/uL (ref 4.0–10.5)

## 2015-02-06 LAB — COMPLETE METABOLIC PANEL WITH GFR
ALT: 35 U/L — AB (ref 6–29)
AST: 28 U/L (ref 10–35)
Albumin: 4.5 g/dL (ref 3.6–5.1)
Alkaline Phosphatase: 69 U/L (ref 33–130)
BUN: 19 mg/dL (ref 7–25)
CALCIUM: 9.5 mg/dL (ref 8.6–10.4)
CHLORIDE: 103 mmol/L (ref 98–110)
CO2: 28 mmol/L (ref 20–31)
CREATININE: 0.89 mg/dL (ref 0.50–1.05)
GFR, EST AFRICAN AMERICAN: 87 mL/min (ref >=60)
GFR, Est Non African American: 76 mL/min (ref >=60)
Glucose, Bld: 87 mg/dL (ref 65–99)
POTASSIUM: 4.1 mmol/L (ref 3.5–5.3)
Sodium: 141 mmol/L (ref 135–146)
Total Bilirubin: 0.3 mg/dL (ref 0.2–1.2)
Total Protein: 6.7 g/dL (ref 6.1–8.1)

## 2015-02-06 LAB — TSH: TSH: 1.146 u[IU]/mL (ref 0.350–4.500)

## 2015-02-06 MED ORDER — PRAMIPEXOLE DIHYDROCHLORIDE 0.125 MG PO TABS: 0.1250 mg | ORAL_TABLET | Freq: Every evening | ORAL | Status: DC | PRN

## 2015-02-06 NOTE — Progress Notes (Signed)
CC: [name] is a 49 y.o. female is here for diarrhea x 2 weeks   Subjective: HPI:  2 weeks of daily diarrhea, she describes this as watery, urgency that occurs first thing in the morning defecating up to 3 times a day. Symptoms come on out of nowhere she is on was had accidents because it comes on so suddenly. Diarrhea was initially clear however over the past 2-3 days it has become green. After wiping this morning she took a look at her feces and noticed a small noodle shaped structure that was moving. Interventions have included Imodium frequently throughout the day but does not seem be helping. No other interventions as of yet. She's lost her appetite, she feels bloated lower in the abdomen but has no urinary symptoms. She denies any nausea or vomiting. She is not expressing any fevers or chills. She denies any recent consumption of undercooked food, travel outside of the country or known sick contacts. They do have a dog but she gets it heart or medicine every month.  She's also had involuntary jerking movements in her legs when she is trying to fall asleep over the past week. Symptoms are bad enough with her keeping her awake. She denies any other motor or sensory disturbances.   Review Of Systems Outlined In HPI  Past Medical History  Diagnosis Date  . Urinary calculus or stone   . Gastric ulcer   . Cancer     cervical  . MVP (mitral valve prolapse)   . IBS (irritable bowel syndrome)   . Heart murmur   . Mitral valve prolapse   . Dysrhythmia     pvc   . Anxiety   . Urinary tract infection   . Headache(784.0)   . Colitis     lymphacitic  . Depression     Past Surgical History  Procedure Laterality Date  . Appendectomy  2005  . Partial hysterectomy  2005  . Tubal ligation  2000  . Tonsillectomy  30 years ago  . Dilation and curettage of uterus      x 4  one after each SAB  . Bilateral oophorectomy  2012  . Exploratory laparotomy  2007  . Laparoscopy N/A 08/04/2012     Procedure: LAPAROSCOPY DIAGNOSTIC;  Surgeon: Gayland Curry, MD,FACS;  Location: [facility];  Service: General;  Laterality: N/A;   Family History  Problem Relation Age of Onset  . Heart attack Mother   . Cirrhosis Mother   . Cancer Father     tongue  . Cirrhosis Father   . Colon cancer Neg Hx   . Esophageal cancer Neg Hx   . Rectal cancer Neg Hx   . Stomach cancer Neg Hx     Social History   Social History  . Marital Status: Married    Spouse Name: N/A  . Number of Children: N/A  . Years of Education: N/A   Occupational History  . Not on file.   Social History Main Topics  . Smoking status: Current Every Day Smoker -- 0.50 packs/day for 25 years    Types: Cigarettes  . Smokeless tobacco: Never Used  . Alcohol Use: 0.0 oz/week     Comment: rarely  . Drug Use: No  . Sexual Activity:    Partners: Male   Other Topics Concern  . Not on file   Social History Narrative     Objective: BP 105/66 mmHg  Pulse 73  Temp(Src) 98.5 F (36.9 C) (Oral)  Wt 175 lb (79.379 kg)  Vital signs reviewed. General: Alert and Oriented, No Acute Distress HEENT: Pupils equal, round, reactive to light. Conjunctivae clear.  External ears unremarkable.  Moist mucous membranes. Lungs: Clear and comfortable work of breathing, speaking in full sentences without accessory muscle use. Cardiac: Regular rate and rhythm.  Neuro: CN II-XII grossly intact, gait normal. Extremities: No peripheral edema.  Strong peripheral pulses.  Mental Status: No depression, anxiety, nor agitation. Logical though process. Skin: Warm and dry.  Assessment & Plan: [name] was seen today for diarrhea x 2 weeks.  Diagnoses and all orders for this visit:  Diarrhea -     TSH -     Ova and parasite examination -     CBC with Differential/Platelet -     Stool culture -     COMPLETE METABOLIC PANEL WITH GFR  RLS (restless legs syndrome) -     pramipexole (MIRAPEX) 0.125 MG tablet; Take 1 tablet (0.125 mg total) by  mouth at bedtime as needed (restless legs).   Diarrhea: Her story is concerning for parasites given a moving noodle shaped structure in her feces. Checking labs above, given the duration of her diarrhea checking for electrolyte abnormality or hepatitis. She would like her thyroid function checked which seems reasonable. Start Mirapex for suspicion of restless leg syndrome. Ultimate plan will be based on the above results.    Return if symptoms worsen or fail to improve.

## 2015-02-07 ENCOUNTER — Telehealth: Payer: Self-pay | Admitting: Family Medicine

## 2015-02-07 ENCOUNTER — Other Ambulatory Visit: Payer: Self-pay | Admitting: Family Medicine

## 2015-02-07 MED ORDER — DIPHENOXYLATE-ATROPINE 2.5-0.025 MG PO TABS: 1.0000 | ORAL_TABLET | Freq: Four times a day (QID) | ORAL | Status: DC | PRN

## 2015-02-07 NOTE — Telephone Encounter (Signed)
Seth Bake, Will you please let patient know that her blood work is normal other than some trace liver inflammation, I would recommend that she start taking Lomotil (rx in your in box) to help reduce her diarrhea while her stool tests are processing.  Also I'd recommend scheduling a follow up appointment with her PCP to recheck her liver function.

## 2015-02-07 NOTE — Telephone Encounter (Signed)
Faxed pt's script Lomotil to [facility] fax# [phone], gave script to Triage to file. Thanks

## 2015-02-07 NOTE — Telephone Encounter (Signed)
Left message on vm

## 2015-02-10 ENCOUNTER — Encounter: Payer: Self-pay | Admitting: Family Medicine

## 2015-02-10 LAB — OVA AND PARASITE EXAMINATION: OP: NONE SEEN

## 2015-02-11 LAB — STOOL CULTURE

## 2015-02-19 ENCOUNTER — Encounter: Payer: Self-pay | Admitting: *Deleted

## 2015-02-19 ENCOUNTER — Emergency Department (INDEPENDENT_AMBULATORY_CARE_PROVIDER_SITE_OTHER)
Admission: EM | Admit: 2015-02-19 | Discharge: 2015-02-19 | Disposition: A | Discharge status: Home / Self Care | Payer: BLUE CROSS/BLUE SHIELD | Source: Home / Self Care | Attending: Family Medicine | Admitting: Family Medicine

## 2015-02-19 DIAGNOSIS — R300 Dysuria: Secondary | ICD-10-CM | POA: Diagnosis not present

## 2015-02-19 LAB — POCT URINALYSIS DIP (MANUAL ENTRY)
Bilirubin, UA: NEGATIVE
Glucose, UA: NEGATIVE
Ketones, POC UA: NEGATIVE
Leukocytes, UA: NEGATIVE
Nitrite, UA: NEGATIVE
Protein Ur, POC: NEGATIVE
Spec Grav, UA: 1.02 (ref 1.005–1.03)
Urobilinogen, UA: 0.2 (ref 0–1)
pH, UA: 6.5 (ref 5–8)

## 2015-02-19 MED ORDER — CEPHALEXIN 500 MG PO CAPS: 500.0000 mg | ORAL_CAPSULE | Freq: Two times a day (BID) | ORAL | Status: DC

## 2015-02-19 NOTE — ED Notes (Signed)
Pt c/o pain with self cath, dysuria and red tinted urine x 5 days. Denies fever or chills.

## 2015-02-19 NOTE — Discharge Instructions (Signed)
Followup with urologist if pain on catheterization persists.

## 2015-02-19 NOTE — ED Provider Notes (Signed)
CSN: [id number]     Arrival date & time 02/19/15  1305 History   First MD Initiated Contact with Patient 02/19/15 1430     Chief Complaint  Patient presents with  . Dysuria     HPI Comments: Patient has a history of interstitial cystitis, and undergoes daily catheterization by herself or her husband.  During the past five days she has had increased discomfort with cath, and mild resistance to catheterization.  She feels well otherwise.  No abdominal or pelvic pain.  No fevers, chills, and sweats.  She normally has hematuria, with no change in appearance of her urine.  She is followed by a urologist, and has been on prophylactic Macrodantin.  Patient is a 49 y.o. female presenting with dysuria. The history is provided by the patient and the spouse.  Dysuria Pain quality:  Burning Pain severity:  Mild Onset quality:  Sudden Duration:  5 days Timing:  Intermittent Progression:  Unchanged Chronicity:  New Recent urinary tract infections: no   Relieved by:  Phenazopyridine Exacerbated by: catheterization. Ineffective treatments:  None tried Urinary symptoms: hematuria   Urinary symptoms: no discolored urine, no foul-smelling urine, no frequent urination, no hesitancy and no bladder incontinence   Associated symptoms: nausea   Associated symptoms: no abdominal pain, no fever, no flank pain, no genital lesions, no vaginal discharge and no vomiting   Risk factors comment:  Interstitial cystitis   Past Medical History  Diagnosis Date  . Urinary calculus or stone   . Gastric ulcer   . Cancer     cervical  . MVP (mitral valve prolapse)   . IBS (irritable bowel syndrome)   . Heart murmur   . Mitral valve prolapse   . Dysrhythmia     pvc   . Anxiety   . Urinary tract infection   . Headache(784.0)   . Colitis     lymphacitic  . Depression    Past Surgical History  Procedure Laterality Date  . Appendectomy  2005  . Partial hysterectomy  2005  . Tubal ligation  2000  .  Tonsillectomy  30 years ago  . Dilation and curettage of uterus      x 4  one after each SAB  . Bilateral oophorectomy  2012  . Exploratory laparotomy  2007  . Laparoscopy N/A 08/04/2012    Procedure: LAPAROSCOPY DIAGNOSTIC;  Surgeon: Gayland Curry, MD,FACS;  Location: [facility];  Service: General;  Laterality: N/A;   Family History  Problem Relation Age of Onset  . Heart attack Mother   . Cirrhosis Mother   . Cancer Father     tongue  . Cirrhosis Father   . Colon cancer Neg Hx   . Esophageal cancer Neg Hx   . Rectal cancer Neg Hx   . Stomach cancer Neg Hx    Social History  Substance Use Topics  . Smoking status: Current Every Day Smoker -- 0.50 packs/day for 25 years    Types: Cigarettes  . Smokeless tobacco: Never Used  . Alcohol Use: 0.0 oz/week     Comment: rarely   OB History    Gravida Para Term Preterm AB TAB SAB Ectopic Multiple Living   5 2 2       2      Review of Systems  Constitutional: Negative for fever.  Gastrointestinal: Positive for nausea. Negative for vomiting and abdominal pain.  Genitourinary: Positive for dysuria. Negative for flank pain and vaginal discharge.  All other systems reviewed and  are negative.   Allergies  Ambien; Amitriptyline; Gabapentin; and Lyrica  Home Medications   Prior to Admission medications   Medication Sig Start Date End Date Taking? Authorizing Provider  atenolol (TENORMIN) 50 MG tablet Take 1/2 tablet daily 04/26/13   Lelon Perla, MD  cephALEXin (KEFLEX) 500 MG capsule Take 1 capsule (500 mg total) by mouth 2 (two) times daily. 02/19/15   Kandra Nicolas, MD  citalopram (CELEXA) 20 MG tablet Take 40 mg by mouth every morning. 01/29/13   Jade L Breeback, PA-C  dexlansoprazole (DEXILANT) 60 MG capsule Take 60 mg by mouth every morning.    Historical Provider, MD  diphenoxylate-atropine (LOMOTIL) 2.5-0.025 MG per tablet Take 1 tablet by mouth 4 (four) times daily as needed for diarrhea or loose stools. 02/07/15   Marcial Pacas, DO  estradiol (ESTRACE) 1 MG tablet TAKE TWO TABLETS BY MOUTH ONCE DAILY WITH SUPPER 06/24/14   Jade L Breeback, PA-C  hydrOXYzine (ATARAX/VISTARIL) 25 MG tablet Take 25 mg by mouth 3 (three) times daily as needed.    Historical Provider, MD  lidocaine (LIDODERM) 5 % Place 1 patch onto the skin daily as needed (For pain.).  09/25/12   Jade L Breeback, PA-C  pentosan polysulfate (ELMIRON) 100 MG capsule Take 100 mg by mouth 2 (two) times daily. Take two tablets by mouth twice a day    Historical Provider, MD  pramipexole (MIRAPEX) 0.125 MG tablet Take 1 tablet (0.125 mg total) by mouth at bedtime as needed (restless legs). 02/06/15   Marcial Pacas, DO  Tapentadol HCl 100 MG TABS Take by mouth 2 (two) times daily. 1 tablet in the AM & 1 tablet in the PM    Historical Provider, MD  Tapentadol HCl 75 MG TABS Take by mouth daily. 1 tablet in the afternoon    Historical Provider, MD   Meds Ordered and Administered this Visit  Medications - No data to display  BP 103/68 mmHg  Pulse 78  Temp(Src) 98.6 F (37 C) (Oral)  Resp 16  Wt 165 lb (74.844 kg)  SpO2 97% No data found.   Physical Exam Nursing notes and Vital Signs reviewed. Appearance:  Patient appears stated age, and in no acute distress Eyes:  Pupils are equal, round, and reactive to light and accomodation.  Extraocular movement is intact.  Conjunctivae are not inflamed  Nose:  Normal Pharynx:  Normal; moist mucous membranes  Neck:  Supple.  No adenopathy Lungs:  Clear to auscultation.  Breath sounds are equal.  Moving air well. Heart:  Regular rate and rhythm without murmurs, rubs, or gallops.  Abdomen:  Mild tenderness over bladder without masses or hepatosplenomegaly.  Bowel sounds are present.  No CVA or flank tenderness.  Extremities:  No edema.  No calf tenderness Skin:  No rash present.   ED Course  Procedures  None    Labs Reviewed  POCT URINALYSIS DIP (MANUAL ENTRY) - Abnormal; Notable for the following:    Blood,  UA moderate (*)    All other components within normal limits     MDM   1. Dysuria; history of interstitial cystitis    Urine culture pending. Begin Keflex 500mg  BID, #14 Followup with urologist if pain on catheterization persists.    Kandra Nicolas, MD 02/20/15 1054

## 2015-02-20 LAB — URINE CULTURE
COLONY COUNT: NO GROWTH
ORGANISM ID, BACTERIA: NO GROWTH

## 2015-02-21 ENCOUNTER — Telehealth: Payer: Self-pay | Admitting: *Deleted

## 2015-02-24 ENCOUNTER — Ambulatory Visit (INDEPENDENT_AMBULATORY_CARE_PROVIDER_SITE_OTHER): Payer: BLUE CROSS/BLUE SHIELD | Admitting: Physician Assistant

## 2015-02-24 ENCOUNTER — Encounter: Payer: Self-pay | Admitting: Physician Assistant

## 2015-02-24 VITALS — BP 121/70 | HR 63 | Temp 98.1°F | Ht 66.0 in

## 2015-02-24 DIAGNOSIS — Z23 Encounter for immunization: Secondary | ICD-10-CM

## 2015-02-24 DIAGNOSIS — Z1239 Encounter for other screening for malignant neoplasm of breast: Secondary | ICD-10-CM

## 2015-02-24 DIAGNOSIS — R319 Hematuria, unspecified: Secondary | ICD-10-CM

## 2015-02-24 DIAGNOSIS — R197 Diarrhea, unspecified: Secondary | ICD-10-CM

## 2015-02-24 DIAGNOSIS — R195 Other fecal abnormalities: Secondary | ICD-10-CM

## 2015-02-24 DIAGNOSIS — R748 Abnormal levels of other serum enzymes: Secondary | ICD-10-CM

## 2015-02-24 LAB — POCT URINALYSIS DIPSTICK
BILIRUBIN UA: NEGATIVE
GLUCOSE UA: NEGATIVE
KETONES UA: NEGATIVE
LEUKOCYTES UA: NEGATIVE
Nitrite, UA: NEGATIVE
Protein, UA: NEGATIVE
Spec Grav, UA: 1.025
Urobilinogen, UA: 0.2
pH, UA: 5.5

## 2015-02-24 MED ORDER — FLUCONAZOLE 200 MG PO TABS: 200.0000 mg | ORAL_TABLET | ORAL | Status: DC

## 2015-02-24 NOTE — Progress Notes (Deleted)
   Subjective:    Patient ID: [name], female    DOB: 1965-08-02, 49 y.o.   MRN: [id number]  HPI Review of Systems     Objective:   Physical Exam        Assessment & Plan:

## 2015-02-24 NOTE — Patient Instructions (Signed)
Call digestive health see next colonoscopy.  Can look into [facility]- Small intestinal bacterial overgrowth.

## 2015-02-24 NOTE — Progress Notes (Signed)
   Subjective:    Patient ID: [name], female    DOB: 09-Jun-1966, 49 y.o.   MRN: [id number]  HPI Patient presents to clinic today following up after recent urgent care visit on 9/7. Urgent care performed UA that was negative for leukocytes and nitrites, but positive for blood. Urine culture was negative. She states experiencing dysuria with urgency and frequency, urinating an average of 12 times/day. She notes extremely painful urinary catheterization for the past 8 days. Urinary symptoms are accompanied by right sided abdominal and pelvic pain that radiates to the center and left sides. Pain is worse after urinating and improves with lying in the fetal position. Patient also reports experiencing nausea and frequent diarrhea for the past few weeks. She denies any vomiting.  .. Past Medical History  Diagnosis Date  . Urinary calculus or stone   . Gastric ulcer   . Cancer     cervical  . MVP (mitral valve prolapse)   . IBS (irritable bowel syndrome)   . Heart murmur   . Mitral valve prolapse   . Dysrhythmia     pvc   . Anxiety   . Urinary tract infection   . Headache(784.0)   . Colitis     lymphacitic  . Depression      Review of Systems Positive for symptoms listed in HPI    Objective:   Physical Exam Cardiac: S1, S2 auscultated. No M/R/G Pulm: CTAB GI: abdomen is tender to palpation in LLQ and RLQ, no rigidity or guarding noted. + BS  UA: Negative for leukocytes and nitrites, positive for moderate blood.       Assessment & Plan:   Interstitial cystitis/lower abdominal pain- Follow up with urology.hematuria today-ongoing for patient. Send urine for microscopic analysis  Diarrhea- unsure of acute nature. Work up negative via stool culture except for yeast. Will treat with diflucan for 4 weeks. Follow up with GI. I feel like there could be some intestinal bacteria overgrowth. Discussed diet and possible visit with [facility].   lymphocytic  colitis- Follow up with GI regarding diarrhea and indication for colonoscopy.   Elevated LFTs- Recheck liver panel  Mammogram- Sent orders to imaging  Reviewed by Iran Planas PA-C

## 2015-02-25 LAB — URINALYSIS, MICROSCOPIC ONLY
BACTERIA UA: NONE SEEN [HPF]
CASTS: NONE SEEN [LPF]
CRYSTALS: NONE SEEN [HPF]
YEAST: NONE SEEN [HPF]

## 2015-02-25 LAB — HEPATIC FUNCTION PANEL
ALBUMIN: 4.3 g/dL (ref 3.6–5.1)
ALK PHOS: 72 U/L (ref 33–130)
ALT: 34 U/L — AB (ref 6–29)
AST: 26 U/L (ref 10–35)
BILIRUBIN TOTAL: 0.3 mg/dL (ref 0.2–1.2)
Bilirubin, Direct: 0.1 mg/dL (ref <=0.2)
Indirect Bilirubin: 0.2 mg/dL (ref 0.2–1.2)
TOTAL PROTEIN: 6.4 g/dL (ref 6.1–8.1)

## 2015-02-26 ENCOUNTER — Telehealth: Payer: Self-pay | Admitting: *Deleted

## 2015-02-26 ENCOUNTER — Encounter: Payer: Self-pay | Admitting: Physician Assistant

## 2015-02-26 NOTE — Telephone Encounter (Signed)
Pt left vm wanting to know if you had a chance to talk to her GI dr yet about whether a colonoscopy is needed.  Also, she stated that she is having to take immodium because now her stools are "pure water" and are a "yellow/orange" color.

## 2015-02-26 NOTE — Telephone Encounter (Signed)
I forgot to ask you to call Dr. Rosezella Florida at [facility] and see when they recommended next colonoscopy. However, given diarrhea has not improved i would suggest pt to make appt to discuss diarrhea.

## 2015-02-27 ENCOUNTER — Other Ambulatory Visit: Payer: Self-pay | Admitting: Physician Assistant

## 2015-02-27 ENCOUNTER — Ambulatory Visit (INDEPENDENT_AMBULATORY_CARE_PROVIDER_SITE_OTHER): Payer: BLUE CROSS/BLUE SHIELD

## 2015-02-27 DIAGNOSIS — Z1231 Encounter for screening mammogram for malignant neoplasm of breast: Secondary | ICD-10-CM | POA: Diagnosis not present

## 2015-02-27 DIAGNOSIS — G2581 Restless legs syndrome: Secondary | ICD-10-CM

## 2015-02-27 DIAGNOSIS — Z1239 Encounter for other screening for malignant neoplasm of breast: Secondary | ICD-10-CM

## 2015-02-27 MED ORDER — PRAMIPEXOLE DIHYDROCHLORIDE 0.125 MG PO TABS: 0.1250 mg | ORAL_TABLET | Freq: Every evening | ORAL | Status: DC | PRN

## 2015-02-27 NOTE — Telephone Encounter (Signed)
Pt advised to schedule appt with Dr. Suella Grove for diarrhea.

## 2015-02-28 NOTE — Telephone Encounter (Signed)
Call pt: Lomotil is a prescription immodium.

## 2015-03-01 ENCOUNTER — Encounter: Payer: Self-pay | Admitting: Physician Assistant

## 2015-03-01 ENCOUNTER — Other Ambulatory Visit: Payer: Self-pay | Admitting: Family Medicine

## 2015-03-03 ENCOUNTER — Other Ambulatory Visit: Payer: Self-pay | Admitting: Physician Assistant

## 2015-03-03 MED ORDER — DIPHENOXYLATE-ATROPINE 2.5-0.025 MG PO TABS: 1.0000 | ORAL_TABLET | Freq: Four times a day (QID) | ORAL | Status: DC | PRN

## 2015-04-14 ENCOUNTER — Ambulatory Visit (INDEPENDENT_AMBULATORY_CARE_PROVIDER_SITE_OTHER): Payer: BLUE CROSS/BLUE SHIELD | Admitting: Physician Assistant

## 2015-04-14 ENCOUNTER — Encounter: Payer: Self-pay | Admitting: Physician Assistant

## 2015-04-14 VITALS — BP 106/61 | HR 74 | Ht 66.0 in

## 2015-04-14 DIAGNOSIS — R319 Hematuria, unspecified: Secondary | ICD-10-CM | POA: Diagnosis not present

## 2015-04-14 DIAGNOSIS — M79672 Pain in left foot: Secondary | ICD-10-CM

## 2015-04-14 DIAGNOSIS — M7989 Other specified soft tissue disorders: Secondary | ICD-10-CM

## 2015-04-14 DIAGNOSIS — M79671 Pain in right foot: Secondary | ICD-10-CM | POA: Diagnosis not present

## 2015-04-14 DIAGNOSIS — Z87440 Personal history of urinary (tract) infections: Secondary | ICD-10-CM

## 2015-04-14 LAB — POCT URINALYSIS DIPSTICK
BILIRUBIN UA: NEGATIVE
GLUCOSE UA: NEGATIVE
KETONES UA: 15
LEUKOCYTES UA: NEGATIVE
NITRITE UA: NEGATIVE
Protein, UA: NEGATIVE
Spec Grav, UA: >=1.03
Urobilinogen, UA: 1
pH, UA: 6

## 2015-04-14 MED ORDER — HYDROCHLOROTHIAZIDE 12.5 MG PO TABS: ORAL_TABLET | ORAL | Status: DC

## 2015-04-14 NOTE — Patient Instructions (Signed)
Light compression and HCTZ up to 25mg  as needed for swelling.

## 2015-04-14 NOTE — Progress Notes (Signed)
   Subjective:    Patient ID: [name], female    DOB: Nov 06, 1965, 49 y.o.   MRN: [id number]  HPI  Pt is a 49 yo female who presents to the clinic with bilateral areas on both anterior feet that are swollen and left side tender. 5 days ago her husband was massaging her feet and notice the swelling. Denies any trauma or injury. Ice does not help. Able to walk without difficulty. Not started any new medications.  Pt has IC but feels like she could be developing infection. Increased urination and some dysuria for last 3 days. Taking AZO. Felt some better today. No fever or chills.     Review of Systems  All other systems reviewed and are negative.      Objective:   Physical Exam  Constitutional: She is oriented to person, place, and time. She appears well-developed and well-nourished.  HENT:  Head: Normocephalic and atraumatic.  Cardiovascular: Normal rate, regular rhythm and normal heart sounds.   Pulmonary/Chest: Effort normal and breath sounds normal.  Abdominal: There is tenderness.  Tenderness to palpation over entire lower abdomen. (this pain is chronic)  Musculoskeletal:       Feet:  ROM normal bilateral feet.  Strength 5/5 bilateral feet.    Neurological: She is alert and oriented to person, place, and time.  Psychiatric: She has a normal mood and affect. Her behavior is normal.          Assessment & Plan:  Foot swelling- possible fluid since appeared in last week :however very suspicious for fat accumulation. Sent HCTZ to use for next few days and as needed to see if decreases in size. Advised to wrap with light compression ace wrap. Elevate and continue to ice a little. F/U as needed.   History of UTI's- recheck urine dipstick and culture. Has IC could be IC flare.

## 2015-04-16 ENCOUNTER — Encounter: Payer: Self-pay | Admitting: Physician Assistant

## 2015-04-16 LAB — URINE CULTURE
Colony Count: NO GROWTH
Organism ID, Bacteria: NO GROWTH

## 2015-04-17 NOTE — Telephone Encounter (Signed)
[name] just have her come see me so I can poke on it.  If this does look like sinus tarsi syndrome to me I'll prolly end up putting her in orthotics.

## 2015-04-18 LAB — URIC ACID: Uric Acid, Serum: 5.7 mg/dL (ref 2.4–7.0)

## 2015-04-18 NOTE — Addendum Note (Signed)
Addended by: Beatris Ship L on: 04/18/2015 08:04 AM   Modules accepted: Orders

## 2015-04-21 ENCOUNTER — Encounter: Payer: Self-pay | Admitting: Family Medicine

## 2015-04-21 ENCOUNTER — Ambulatory Visit (INDEPENDENT_AMBULATORY_CARE_PROVIDER_SITE_OTHER): Payer: Medicare Other | Admitting: Family Medicine

## 2015-04-21 ENCOUNTER — Ambulatory Visit (INDEPENDENT_AMBULATORY_CARE_PROVIDER_SITE_OTHER): Payer: BLUE CROSS/BLUE SHIELD

## 2015-04-21 VITALS — BP 118/69 | HR 68 | Wt 180.0 lb

## 2015-04-21 DIAGNOSIS — M25472 Effusion, left ankle: Secondary | ICD-10-CM

## 2015-04-21 DIAGNOSIS — M25471 Effusion, right ankle: Secondary | ICD-10-CM

## 2015-04-21 DIAGNOSIS — M25473 Effusion, unspecified ankle: Secondary | ICD-10-CM

## 2015-04-21 DIAGNOSIS — M25579 Pain in unspecified ankle and joints of unspecified foot: Secondary | ICD-10-CM | POA: Insufficient documentation

## 2015-04-21 NOTE — Assessment & Plan Note (Signed)
X-rays bilaterally as this is likely due to osteoarthritis. Alternatives include synovitis. Recommend ankle sleeve. If not better in about 2 weeks with relative rest will consider diagnostic aspiration and hopefully therapeutic steroid injection.

## 2015-04-21 NOTE — Progress Notes (Signed)
   Subjective:    I'm seeing this patient as a consultation for:  Bubba Camp PA-C  CC: Bilateral ankle swelling  HPI: Patient has bilateral painful ankle swelling present for about 2 weeks. Symptoms started without injury. She was seen by her PCP who thought possibly due to effusion and prescribed hydrochlorothiazide which did not help. Additionally she obtained a uric acid level which was less than 6. The symptoms are mild to bothersome. She is able to do her activities as needed.  Past medical history, Surgical history, Family history not pertinant except as noted below, Social history, Allergies, and medications have been entered into the medical record, reviewed, and no changes needed.   Review of Systems: No headache, visual changes, nausea, vomiting, diarrhea, constipation, dizziness, abdominal pain, skin rash, fevers, chills, night sweats, weight loss, swollen lymph nodes, body aches, joint swelling, muscle aches, chest pain, shortness of breath, mood changes, visual or auditory hallucinations.   Objective:    Filed Vitals:   04/21/15 1623  BP: 118/69  Pulse: 68   General: Well Developed, well nourished, and in no acute distress.  Neuro/Psych: Alert and oriented x3, extra-ocular muscles intact, able to move all 4 extremities, sensation grossly intact. Skin: Warm and dry, no rashes noted.  Respiratory: Not using accessory muscles, speaking in full sentences, trachea midline.  Cardiovascular: Pulses palpable, no extremity edema. Abdomen: Does not appear distended. MSK: Bilateral ankles are relatively normal appearing with mild effusion. Nontender normal motion pulses capillary refill sensation intact.  No results found for this or any previous visit (from the past 24 hour(s)). No results found.  Impression and Recommendations:   This case required medical decision making of moderate complexity.

## 2015-04-21 NOTE — Patient Instructions (Signed)
Thank you for coming in today. Use the Body Helix ankle sleeve.  Return in 2 weeks.  Get xray today.

## 2015-04-22 NOTE — Progress Notes (Signed)
Quick Note:  Swelling is seen on xray. Not a lot of arthritis. No other bony changes seen. ______

## 2015-04-23 NOTE — Progress Notes (Signed)
HPI: 49 yo female for evaluation of palpitations and MVP. Last echocardiogram July 2013 showed normal LV function, grade 1 diastolic dysfunction, moderate prolapse of the anterior and posterior mitral valve leaflet and mild mitral regurgitation. Patient denies chest pain. Her palpitations are reasonably controlled with beta blockade. She feels an occasional skip but not sustained. She does have some dyspnea on exertion but no orthopnea, PND or pedal edema.  Current Outpatient Prescriptions  Medication Sig Dispense Refill  . atenolol (TENORMIN) 50 MG tablet Take 1/2 tablet daily 180 tablet 3  . citalopram (CELEXA) 20 MG tablet Take 40 mg by mouth every morning.    . diphenoxylate-atropine (LOMOTIL) 2.5-0.025 MG per tablet Take 1 tablet by mouth 4 (four) times daily as needed for diarrhea or loose stools. 30 tablet 0  . fluconazole (DIFLUCAN) 200 MG tablet Take 1 tablet (200 mg total) by mouth once a week. 4 tablet 0  . hydrOXYzine (ATARAX/VISTARIL) 25 MG tablet Take 25 mg by mouth 3 (three) times daily as needed.    . lidocaine (LIDODERM) 5 % Place 1 patch onto the skin daily as needed (For pain.).     [name] nitrofurantoin (MACRODANTIN) 50 MG capsule Take 50 mg by mouth daily.    . pentosan polysulfate (ELMIRON) 100 MG capsule Take 100 mg by mouth 2 (two) times daily. Take two tablets by mouth twice a day    . perphenazine (TRILAFON) 2 MG tablet Take 2 mg by mouth 2 (two) times daily.    . pramipexole (MIRAPEX) 0.125 MG tablet Take 1 tablet (0.125 mg total) by mouth at bedtime as needed (restless legs). 30 tablet 2  . Tapentadol HCl 100 MG TABS Take by mouth 2 (two) times daily. 1 tablet in the AM & 1 tablet in the PM    . Tapentadol HCl 75 MG TABS Take by mouth daily. 1 tablet in the afternoon     No current facility-administered medications for this visit.    Allergies  Allergen Reactions  . Gabapentin Anaphylaxis    Sleepiness Sleepiness   . Lyrica [Pregabalin] Anaphylaxis    Made  symptoms worse.  Made symptoms worse.   . Ambien [Zolpidem] Other (See Comments)    Insomnia Pt did not state her reaction   . Amitriptyline Other (See Comments)    Pt did not state her reaction     Past Medical History  Diagnosis Date  . Urinary calculus or stone   . Gastric ulcer   . Cancer (HCC)     cervical  . MVP (mitral valve prolapse)   . IBS (irritable bowel syndrome)   . Heart murmur   . Dysrhythmia     pvc   . Anxiety   . Headache(784.0)   . Colitis     lymphacitic  . Depression   . Interstitial cystitis     Past Surgical History  Procedure Laterality Date  . Appendectomy  2005  . Partial hysterectomy  2005  . Tubal ligation  2000  . Tonsillectomy  30 years ago  . Dilation and curettage of uterus      x 4  one after each SAB  . Bilateral oophorectomy  2012  . Exploratory laparotomy  2007  . Laparoscopy N/A 08/04/2012    Procedure: LAPAROSCOPY DIAGNOSTIC;  Surgeon: Gayland Curry, MD,FACS;  Location: [facility];  Service: General;  Laterality: N/A;    Social History   Social History  . Marital Status: Married    Spouse Name:  N/A  . Number of Children: 2  . Years of Education: N/A   Occupational History  . Not on file.   Social History Main Topics  . Smoking status: Current Every Day Smoker -- 0.50 packs/day for 25 years    Types: Cigarettes  . Smokeless tobacco: Never Used  . Alcohol Use: 0.0 oz/week     Comment: rarely  . Drug Use: No  . Sexual Activity:    Partners: Male   Other Topics Concern  . Not on file   Social History Narrative    Family History  Problem Relation Age of Onset  . Heart attack Mother   . Cirrhosis Mother   . Cancer Father     tongue  . Cirrhosis Father   . Colon cancer Neg Hx   . Esophageal cancer Neg Hx   . Rectal cancer Neg Hx   . Stomach cancer Neg Hx     ROS: Pain associated with interstitial cystitis but no fevers or chills, productive cough, hemoptysis, dysphasia, odynophagia, melena, hematochezia,  dysuria, hematuria, rash, seizure activity, orthopnea, PND, pedal edema, claudication. Remaining systems are negative.  Physical Exam:   Blood pressure 122/66, pulse 77, height 5\' 6"  (1.676 m), weight 83.97 kg (185 lb 1.9 oz).  General:  Well developed/well nourished in NAD Skin warm/dry, tattoo Patient not depressed No peripheral clubbing Back-normal HEENT-normal/normal eyelids Neck supple/normal carotid upstroke bilaterally; no bruits; no JVD; no thyromegaly chest - CTA/ normal expansion CV - RRR/normal S1 and S2; no murmurs, rubs or gallops;  PMI nondisplaced Abdomen -NT/ND, no HSM, no mass, + bowel sounds, no bruit 2+ femoral pulses, no bruits Ext-no edema, chords, 2+ DP Neuro-grossly nonfocal  ECG Sinus rhythm at a rate of 77. Cannot rule out prior septal infarct.

## 2015-04-30 ENCOUNTER — Encounter: Payer: Self-pay | Admitting: Cardiology

## 2015-04-30 ENCOUNTER — Ambulatory Visit (INDEPENDENT_AMBULATORY_CARE_PROVIDER_SITE_OTHER): Payer: BLUE CROSS/BLUE SHIELD | Admitting: Cardiology

## 2015-04-30 VITALS — BP 122/66 | HR 77 | Ht 66.0 in | Wt 185.1 lb

## 2015-04-30 DIAGNOSIS — F17200 Nicotine dependence, unspecified, uncomplicated: Secondary | ICD-10-CM | POA: Diagnosis not present

## 2015-04-30 DIAGNOSIS — I059 Rheumatic mitral valve disease, unspecified: Secondary | ICD-10-CM

## 2015-04-30 DIAGNOSIS — R002 Palpitations: Secondary | ICD-10-CM

## 2015-04-30 MED ORDER — ATENOLOL 50 MG PO TABS: ORAL_TABLET | ORAL | Status: DC

## 2015-04-30 NOTE — Assessment & Plan Note (Signed)
Patient counseled on discontinuing. 

## 2015-04-30 NOTE — Assessment & Plan Note (Signed)
Sounds likely related to PACs or PVCs. Will continue atenolol as symptoms are recently well controlled.

## 2015-04-30 NOTE — Assessment & Plan Note (Signed)
Repeat echocardiogram. 

## 2015-04-30 NOTE — Patient Instructions (Signed)

## 2015-05-05 ENCOUNTER — Ambulatory Visit (INDEPENDENT_AMBULATORY_CARE_PROVIDER_SITE_OTHER): Payer: BLUE CROSS/BLUE SHIELD | Admitting: Family Medicine

## 2015-05-05 ENCOUNTER — Encounter: Payer: Self-pay | Admitting: Family Medicine

## 2015-05-05 VITALS — BP 121/75 | HR 70 | Wt 185.0 lb

## 2015-05-05 DIAGNOSIS — Z960 Presence of urogenital implants: Secondary | ICD-10-CM

## 2015-05-05 DIAGNOSIS — M25579 Pain in unspecified ankle and joints of unspecified foot: Secondary | ICD-10-CM | POA: Diagnosis not present

## 2015-05-05 NOTE — Patient Instructions (Signed)
Thank you for coming in today. Call or go to the ER if you develop a large red swollen joint with extreme pain or oozing puss.  Return for right ankle injection.   Continue the brace as needed.

## 2015-05-05 NOTE — Assessment & Plan Note (Signed)
Bilateral symptoms. Left worse than right. X-rays relatively normal. Diagnostic ultrasound also relatively normal. Unlikely for effusion. Patient cannot have an MRI. Plan for trial of diagnostic and therapeutic injection. Recheck a few weeks.

## 2015-05-05 NOTE — Progress Notes (Signed)
[name] is a 49 y.o. female who presents to [place]: [facility]  today for follow-up bilateral follow-up bilateral ankle pain and swelling. Patient was last seen on November 7 for bilateral ankle joint swelling and pain. This is been ongoing for quite some time and had failed conservative management with diuretics. She denies any specific injury. The last visit x-rays were obtained which were normal bilaterally. She additionally was recommended to use a compressive ankle sleeve which has not helped. She feels well otherwise. No fevers chills nausea vomiting or diarrhea.  Of note patient has been planted bladder stimulator for her interstitial cystitis. This is MRI condition all been not compatible with body MRI.   Past Medical History  Diagnosis Date  . Urinary calculus or stone   . Gastric ulcer   . Cancer (HCC)     cervical  . MVP (mitral valve prolapse)   . IBS (irritable bowel syndrome)   . Heart murmur   . Dysrhythmia     pvc   . Anxiety   . Headache(784.0)   . Colitis     lymphacitic  . Depression   . Interstitial cystitis    Past Surgical History  Procedure Laterality Date  . Appendectomy  2005  . Partial hysterectomy  2005  . Tubal ligation  2000  . Tonsillectomy  30 years ago  . Dilation and curettage of uterus      x 4  one after each SAB  . Bilateral oophorectomy  2012  . Exploratory laparotomy  2007  . Laparoscopy N/A 08/04/2012    Procedure: LAPAROSCOPY DIAGNOSTIC;  Surgeon: Gayland Curry, MD,FACS;  Location: [facility];  Service: General;  Laterality: N/A;   Social History  Substance Use Topics  . Smoking status: Current Every Day Smoker -- 0.50 packs/day for 25 years    Types: Cigarettes  . Smokeless tobacco: Never Used  . Alcohol Use: 0.0 oz/week     Comment: rarely   family history includes Cancer in her father; Cirrhosis in her father and mother; Heart attack in her mother. There is no history of Colon cancer, Esophageal  cancer, Rectal cancer, or Stomach cancer.  ROS as above Medications: Current Outpatient Prescriptions  Medication Sig Dispense Refill  . atenolol (TENORMIN) 50 MG tablet Take 1/2 tablet daily 90 tablet 3  . citalopram (CELEXA) 20 MG tablet Take 40 mg by mouth every morning.    . diphenoxylate-atropine (LOMOTIL) 2.5-0.025 MG per tablet Take 1 tablet by mouth 4 (four) times daily as needed for diarrhea or loose stools. 30 tablet 0  . fluconazole (DIFLUCAN) 200 MG tablet Take 1 tablet (200 mg total) by mouth once a week. 4 tablet 0  . hydrOXYzine (ATARAX/VISTARIL) 25 MG tablet Take 25 mg by mouth 3 (three) times daily as needed.    . lidocaine (LIDODERM) 5 % Place 1 patch onto the skin daily as needed (For pain.).     [name] nitrofurantoin (MACRODANTIN) 50 MG capsule Take 50 mg by mouth daily.    . pentosan polysulfate (ELMIRON) 100 MG capsule Take 100 mg by mouth 2 (two) times daily. Take two tablets by mouth twice a day    . perphenazine (TRILAFON) 2 MG tablet Take 2 mg by mouth 2 (two) times daily.    . pramipexole (MIRAPEX) 0.125 MG tablet Take 1 tablet (0.125 mg total) by mouth at bedtime as needed (restless legs). 30 tablet 2  . Tapentadol HCl 100 MG TABS Take by mouth 2 (two)  times daily. 1 tablet in the AM & 1 tablet in the PM    . Tapentadol HCl 75 MG TABS Take by mouth daily. 1 tablet in the afternoon     No current facility-administered medications for this visit.   Allergies  Allergen Reactions  . Gabapentin Anaphylaxis    Sleepiness Sleepiness   . Lyrica [Pregabalin] Anaphylaxis    Made symptoms worse.  Made symptoms worse.   . Ambien [Zolpidem] Other (See Comments)    Insomnia Pt did not state her reaction   . Amitriptyline Other (See Comments)    Pt did not state her reaction     Exam:  BP 121/75 mmHg  Pulse 70  Wt 185 lb (83.915 kg) Gen: Well NAD Ankles bilaterally have mild swelling at the anterior lateral ankle joint. That area is also tender to palpation mildly.  She has normal ankle motion and stability. Pulses capillary refill and sensation are intact   Limited musculoskeletal ultrasound of the ankles bilaterally: Left ankle: Hypoechoic fluid seen overlying the lateral talus. There is a slight bony irregularity without any increased vascular activity. There is no obvious ankle joint effusion. Right ankle: Normal bony appearance. No effusion. Slight hypoechoic fluid without Doppler activity present.  Procedure: Real-time Ultrasound Guided Injection of left ankle   Device: GE Logiq E  Images permanently stored and available for review in the ultrasound unit. Verbal informed consent obtained. Discussed risks and benefits of procedure. Warned about infection bleeding damage to structures skin hypopigmentation and fat atrophy among others. Patient expresses understanding and agreement Time-out conducted.  Noted no overlying erythema, induration, or other signs of local infection.  Skin prepped in a sterile fashion.  Local anesthesia: Topical Ethyl chloride.  With sterile technique and under real time ultrasound guidance: 40mg  Kenalog and 3 mL of Marcaine injected easily.  Completed without difficulty  Pain immediately resolved suggesting accurate placement of the medication.  Advised to call if fevers/chills, erythema, induration, drainage, or persistent bleeding.  Images permanently stored and available for review in the ultrasound unit.  Impression: Technically successful ultrasound guided injection.  X-ray ankle bilaterally reviewed  No results found for this or any previous visit (from the past 24 hour(s)). No results found.   Please see individual assessment and plan sections.

## 2015-05-14 ENCOUNTER — Ambulatory Visit (HOSPITAL_BASED_OUTPATIENT_CLINIC_OR_DEPARTMENT_OTHER)
Admission: RE | Admit: 2015-05-14 | Discharge: 2015-05-14 | Disposition: A | Discharge status: Home / Self Care | Payer: Medicare Other | Source: Ambulatory Visit | Attending: Cardiology | Admitting: Cardiology

## 2015-05-14 DIAGNOSIS — F17200 Nicotine dependence, unspecified, uncomplicated: Secondary | ICD-10-CM | POA: Insufficient documentation

## 2015-05-14 DIAGNOSIS — I059 Rheumatic mitral valve disease, unspecified: Secondary | ICD-10-CM | POA: Diagnosis not present

## 2015-05-14 DIAGNOSIS — I509 Heart failure, unspecified: Secondary | ICD-10-CM | POA: Diagnosis not present

## 2015-05-14 DIAGNOSIS — I348 Other nonrheumatic mitral valve disorders: Secondary | ICD-10-CM

## 2015-05-14 DIAGNOSIS — I341 Nonrheumatic mitral (valve) prolapse: Secondary | ICD-10-CM | POA: Diagnosis not present

## 2015-05-14 NOTE — Progress Notes (Signed)
*  PRELIMINARY RESULTS* Echocardiogram 2D Echocardiogram has been performed.  [name] 05/14/2015, 9:57 AM

## 2015-05-21 ENCOUNTER — Encounter: Payer: Self-pay | Admitting: Family Medicine

## 2015-05-21 ENCOUNTER — Ambulatory Visit (INDEPENDENT_AMBULATORY_CARE_PROVIDER_SITE_OTHER): Payer: Medicare Other | Admitting: Family Medicine

## 2015-05-21 VITALS — BP 120/64 | HR 61 | Wt 185.0 lb

## 2015-05-21 DIAGNOSIS — M25579 Pain in unspecified ankle and joints of unspecified foot: Secondary | ICD-10-CM

## 2015-05-21 LAB — COMPREHENSIVE METABOLIC PANEL
ALBUMIN: 3.9 g/dL (ref 3.6–5.1)
ALK PHOS: 74 U/L (ref 33–130)
ALT: 33 U/L — AB (ref 6–29)
AST: 20 U/L (ref 10–35)
BILIRUBIN TOTAL: 0.3 mg/dL (ref 0.2–1.2)
BUN: 14 mg/dL (ref 7–25)
CALCIUM: 9.4 mg/dL (ref 8.6–10.4)
CO2: 25 mmol/L (ref 20–31)
Chloride: 102 mmol/L (ref 98–110)
Creat: 0.73 mg/dL (ref 0.50–1.05)
Glucose, Bld: 77 mg/dL (ref 65–99)
Potassium: 4.7 mmol/L (ref 3.5–5.3)
Sodium: 140 mmol/L (ref 135–146)
Total Protein: 6.6 g/dL (ref 6.1–8.1)

## 2015-05-21 LAB — CBC WITH DIFFERENTIAL/PLATELET
Basophils Absolute: 0 10*3/uL (ref 0.0–0.1)
Basophils Relative: 0 % (ref 0–1)
EOS ABS: 0.3 10*3/uL (ref 0.0–0.7)
EOS PCT: 3 % (ref 0–5)
HCT: 37.6 % (ref 36.0–46.0)
Hemoglobin: 12.5 g/dL (ref 12.0–15.0)
LYMPHS ABS: 2.1 10*3/uL (ref 0.7–4.0)
LYMPHS PCT: 24 % (ref 12–46)
MCH: 31.1 pg (ref 26.0–34.0)
MCHC: 33.2 g/dL (ref 30.0–36.0)
MCV: 93.5 fL (ref 78.0–100.0)
MONO ABS: 0.7 10*3/uL (ref 0.1–1.0)
MONOS PCT: 8 % (ref 3–12)
MPV: 9.2 fL (ref 8.6–12.4)
Neutro Abs: 5.6 10*3/uL (ref 1.7–7.7)
Neutrophils Relative %: 65 % (ref 43–77)
PLATELETS: 394 10*3/uL (ref 150–400)
RBC: 4.02 MIL/uL (ref 3.87–5.11)
RDW: 13.8 % (ref 11.5–15.5)
WBC: 8.6 10*3/uL (ref 4.0–10.5)

## 2015-05-21 LAB — RHEUMATOID FACTOR: RHEUMATOID FACTOR: 11 [IU]/mL (ref <=14)

## 2015-05-21 LAB — CK: CK TOTAL: 66 U/L (ref 7–177)

## 2015-05-21 NOTE — Assessment & Plan Note (Signed)
Unclear etiology. I would prefer to do an MRI however patient has implant device that is not compatible with body MRI. Obtain noncontrast CT scan bilaterally. Additionally obtain rheumatologic labs. Patient has failed conservative management including home exercises and injections. Return following CT scan and labs for discussion.

## 2015-05-21 NOTE — Progress Notes (Signed)
[name] is a 49 y.o. female who presents to [place]: Primary Care today for follow-up ankle pain. Patient continues to have ankle pain and swelling. The steroid injection did not help much at all. The pain and swelling is now interfering with the patient's ability to exercise. She denies any fevers chills nausea vomiting or diarrhea.   Past Medical History  Diagnosis Date  . Urinary calculus or stone   . Gastric ulcer   . Cancer (HCC)     cervical  . MVP (mitral valve prolapse)   . IBS (irritable bowel syndrome)   . Heart murmur   . Dysrhythmia     pvc   . Anxiety   . Headache(784.0)   . Colitis     lymphacitic  . Depression   . Interstitial cystitis    Past Surgical History  Procedure Laterality Date  . Appendectomy  2005  . Partial hysterectomy  2005  . Tubal ligation  2000  . Tonsillectomy  30 years ago  . Dilation and curettage of uterus      x 4  one after each SAB  . Bilateral oophorectomy  2012  . Exploratory laparotomy  2007  . Laparoscopy N/A 08/04/2012    Procedure: LAPAROSCOPY DIAGNOSTIC;  Surgeon: Gayland Curry, MD,FACS;  Location: [facility];  Service: General;  Laterality: N/A;   Social History  Substance Use Topics  . Smoking status: Current Every Day Smoker -- 0.50 packs/day for 25 years    Types: Cigarettes  . Smokeless tobacco: Never Used  . Alcohol Use: 0.0 oz/week     Comment: rarely   family history includes Cancer in her father; Cirrhosis in her father and mother; Heart attack in her mother. There is no history of Colon cancer, Esophageal cancer, Rectal cancer, or Stomach cancer.  ROS as above Medications: Current Outpatient Prescriptions  Medication Sig Dispense Refill  . atenolol (TENORMIN) 50 MG tablet Take 1/2 tablet daily 90 tablet 3  . citalopram (CELEXA) 20 MG tablet Take 40 mg by mouth every morning.    . hydrOXYzine (ATARAX/VISTARIL) 25 MG tablet Take 25 mg by mouth 3 (three) times daily as needed.    .  lidocaine (LIDODERM) 5 % Place 1 patch onto the skin daily as needed (For pain.).     [name] morphine (MS CONTIN) 30 MG 12 hr tablet Take 30 mg by mouth every 12 (twelve) hours.    . nitrofurantoin (MACRODANTIN) 50 MG capsule Take 50 mg by mouth daily.    . pentosan polysulfate (ELMIRON) 100 MG capsule Take 100 mg by mouth 2 (two) times daily. Take two tablets by mouth twice a day    . pramipexole (MIRAPEX) 0.125 MG tablet Take 1 tablet (0.125 mg total) by mouth at bedtime as needed (restless legs). 30 tablet 2  . Tapentadol HCl 75 MG TABS Take by mouth daily. 1 tablet in the afternoon    . perphenazine (TRILAFON) 2 MG tablet Take 2 mg by mouth 2 (two) times daily.     No current facility-administered medications for this visit.   Allergies  Allergen Reactions  . Gabapentin Anaphylaxis    Sleepiness Sleepiness   . Lyrica [Pregabalin] Anaphylaxis    Made symptoms worse.  Made symptoms worse.   . Ambien [Zolpidem] Other (See Comments)    Insomnia Pt did not state her reaction   . Amitriptyline Other (See Comments)    Pt did not state her reaction     Exam:  BP  120/64 mmHg  Pulse 61  Wt 185 lb (83.915 kg) Gen: Well NAD Normal gait.  No results found for this or any previous visit (from the past 24 hour(s)). No results found.   Please see individual assessment and plan sections.

## 2015-05-21 NOTE — Patient Instructions (Signed)
Thank you for coming in today. We will do labs and CT scan.  Return in 1-2 weeks to discuss labs and CT scan in detail.

## 2015-05-22 LAB — SEDIMENTATION RATE: Sed Rate: 22 mm/hr — ABNORMAL HIGH (ref 0–20)

## 2015-05-22 LAB — ANA: ANA: NEGATIVE

## 2015-05-22 LAB — CYCLIC CITRUL PEPTIDE ANTIBODY, IGG: Cyclic Citrullin Peptide Ab: <16 Units

## 2015-05-23 NOTE — Progress Notes (Signed)
Quick Note:  Labs show mild elevation of Sed rate. Everything else was normal. This is a non-specific marker for inflimation and yours was 2 over the cutoff. This probably does not mean anything. ______

## 2015-05-26 ENCOUNTER — Other Ambulatory Visit: Payer: Self-pay | Admitting: Physician Assistant

## 2015-05-28 ENCOUNTER — Other Ambulatory Visit (HOSPITAL_BASED_OUTPATIENT_CLINIC_OR_DEPARTMENT_OTHER): Payer: Medicare Other

## 2015-05-28 ENCOUNTER — Ambulatory Visit (HOSPITAL_BASED_OUTPATIENT_CLINIC_OR_DEPARTMENT_OTHER)
Admission: RE | Admit: 2015-05-28 | Discharge: 2015-05-28 | Disposition: A | Discharge status: Home / Self Care | Payer: Medicare Other | Source: Ambulatory Visit | Attending: Family Medicine | Admitting: Family Medicine

## 2015-05-28 DIAGNOSIS — M25579 Pain in unspecified ankle and joints of unspecified foot: Secondary | ICD-10-CM | POA: Diagnosis present

## 2015-05-28 DIAGNOSIS — R2242 Localized swelling, mass and lump, left lower limb: Secondary | ICD-10-CM | POA: Insufficient documentation

## 2015-05-28 DIAGNOSIS — M25572 Pain in left ankle and joints of left foot: Secondary | ICD-10-CM | POA: Diagnosis not present

## 2015-05-28 DIAGNOSIS — M7989 Other specified soft tissue disorders: Secondary | ICD-10-CM | POA: Diagnosis not present

## 2015-05-28 NOTE — Progress Notes (Signed)
Quick Note:  CT scan is normal ______

## 2015-06-03 ENCOUNTER — Encounter: Payer: Self-pay | Admitting: Family Medicine

## 2015-06-03 ENCOUNTER — Ambulatory Visit (INDEPENDENT_AMBULATORY_CARE_PROVIDER_SITE_OTHER): Payer: Medicare Other | Admitting: Family Medicine

## 2015-06-03 VITALS — BP 103/60 | HR 67 | Wt 185.0 lb

## 2015-06-03 DIAGNOSIS — M25579 Pain in unspecified ankle and joints of unspecified foot: Secondary | ICD-10-CM

## 2015-06-03 DIAGNOSIS — R109 Unspecified abdominal pain: Secondary | ICD-10-CM | POA: Diagnosis not present

## 2015-06-03 DIAGNOSIS — Z79891 Long term (current) use of opiate analgesic: Secondary | ICD-10-CM | POA: Diagnosis not present

## 2015-06-03 DIAGNOSIS — G8929 Other chronic pain: Secondary | ICD-10-CM | POA: Diagnosis not present

## 2015-06-03 DIAGNOSIS — N3010 Interstitial cystitis (chronic) without hematuria: Secondary | ICD-10-CM | POA: Diagnosis not present

## 2015-06-03 NOTE — Progress Notes (Signed)
[name] is a 49 y.o. female who presents to [place]: [facility] today for follow up BL foot pain and swelling. Patient has had an extensive workup now for bilateral foot and ankle pain and swelling. This is been ongoing for months. She's had a rheumatologic workup which is only significant for mildly elevated sedimentation rate and a normal x-ray and normal CT scan. The pain continues. She did not benefit much from trial of injection. Symptoms are mild to moderate and bothersome.   Past Medical History  Diagnosis Date  . Urinary calculus or stone   . Gastric ulcer   . Cancer (HCC)     cervical  . MVP (mitral valve prolapse)   . IBS (irritable bowel syndrome)   . Heart murmur   . Dysrhythmia     pvc   . Anxiety   . Headache(784.0)   . Colitis     lymphacitic  . Depression   . Interstitial cystitis    Past Surgical History  Procedure Laterality Date  . Appendectomy  2005  . Partial hysterectomy  2005  . Tubal ligation  2000  . Tonsillectomy  30 years ago  . Dilation and curettage of uterus      x 4  one after each SAB  . Bilateral oophorectomy  2012  . Exploratory laparotomy  2007  . Laparoscopy N/A 08/04/2012    Procedure: LAPAROSCOPY DIAGNOSTIC;  Surgeon: Gayland Curry, MD,FACS;  Location: [facility];  Service: General;  Laterality: N/A;   Social History  Substance Use Topics  . Smoking status: Current Every Day Smoker -- 0.50 packs/day for 25 years    Types: Cigarettes  . Smokeless tobacco: Never Used  . Alcohol Use: 0.0 oz/week     Comment: rarely   family history includes Cancer in her father; Cirrhosis in her father and mother; Heart attack in her mother. There is no history of Colon cancer, Esophageal cancer, Rectal cancer, or Stomach cancer.  ROS as above Medications: Current Outpatient Prescriptions  Medication Sig Dispense Refill  . atenolol (TENORMIN) 50 MG  tablet Take 1/2 tablet daily 90 tablet 3  . citalopram (CELEXA) 20 MG tablet Take 40 mg by mouth every morning.    . hydrOXYzine (ATARAX/VISTARIL) 25 MG tablet Take 25 mg by mouth 3 (three) times daily as needed.    . lidocaine (LIDODERM) 5 % Place 1 patch onto the skin daily as needed (For pain.).     [name] morphine (MS CONTIN) 30 MG 12 hr tablet Take 30 mg by mouth every 12 (twelve) hours.    . nitrofurantoin (MACRODANTIN) 50 MG capsule Take 50 mg by mouth daily.    . pentosan polysulfate (ELMIRON) 100 MG capsule Take 100 mg by mouth 2 (two) times daily. Take two tablets by mouth twice a day    . perphenazine (TRILAFON) 2 MG tablet Take 2 mg by mouth 2 (two) times daily.    . pramipexole (MIRAPEX) 0.125 MG tablet TAKE ONE TABLET BY MOUTH AT BEDTIME AS NEEDED FOR RESTLESS LEGS 30 tablet 0  . Tapentadol HCl 75 MG TABS Take by mouth daily. 1 tablet in the afternoon     No current facility-administered medications for this visit.   Allergies  Allergen Reactions  . Gabapentin Anaphylaxis    Sleepiness Sleepiness   . Lyrica [Pregabalin] Anaphylaxis    Made symptoms worse.  Made symptoms worse.   . Ambien [Zolpidem] Other (See Comments)  Insomnia Pt did not state her reaction   . Amitriptyline Other (See Comments)    Pt did not state her reaction     Exam:  BP 103/60 mmHg  Pulse 67  Wt 185 lb (83.915 kg) Gen: Well NAD Feet bilaterally have pronation and pes planus. Mild swelling laterally. Nontender normal motion.  CT scan foot and ankle, x-ray ankle, labs reviewed.  No results found for this or any previous visit (from the past 24 hour(s)). No results found.   Please see individual assessment and plan sections.

## 2015-06-03 NOTE — Assessment & Plan Note (Signed)
Etiology at this time is unclear. I'm concerned for a rheumatologic cause. Refer to rheumatology. Return as needed.

## 2015-06-03 NOTE — Patient Instructions (Signed)
Thank you for coming in today. We will get the referral to Rheumatology.  Return to me as needed.  Follow up with [facility] Plan[facility].

## 2015-06-18 ENCOUNTER — Ambulatory Visit (INDEPENDENT_AMBULATORY_CARE_PROVIDER_SITE_OTHER): Payer: BLUE CROSS/BLUE SHIELD | Admitting: Physician Assistant

## 2015-06-18 ENCOUNTER — Encounter: Payer: Self-pay | Admitting: Physician Assistant

## 2015-06-18 VITALS — BP 97/64 | HR 83 | Temp 97.9°F | Wt 185.0 lb

## 2015-06-18 DIAGNOSIS — Z13220 Encounter for screening for lipoid disorders: Secondary | ICD-10-CM | POA: Diagnosis not present

## 2015-06-18 DIAGNOSIS — R300 Dysuria: Secondary | ICD-10-CM

## 2015-06-18 DIAGNOSIS — R5383 Other fatigue: Secondary | ICD-10-CM

## 2015-06-18 DIAGNOSIS — Z79899 Other long term (current) drug therapy: Secondary | ICD-10-CM

## 2015-06-18 DIAGNOSIS — Z8249 Family history of ischemic heart disease and other diseases of the circulatory system: Secondary | ICD-10-CM

## 2015-06-18 DIAGNOSIS — Z0000 Encounter for general adult medical examination without abnormal findings: Secondary | ICD-10-CM | POA: Diagnosis not present

## 2015-06-18 DIAGNOSIS — K5900 Constipation, unspecified: Secondary | ICD-10-CM

## 2015-06-18 DIAGNOSIS — Z780 Asymptomatic menopausal state: Secondary | ICD-10-CM

## 2015-06-18 LAB — POCT URINALYSIS DIPSTICK
BILIRUBIN UA: NEGATIVE
GLUCOSE UA: NEGATIVE
Leukocytes, UA: NEGATIVE
NITRITE UA: NEGATIVE
Protein, UA: NEGATIVE
Urobilinogen, UA: 0.2
pH, UA: 6

## 2015-06-18 MED ORDER — NALOXEGOL OXALATE 25 MG PO TABS: 25.0000 mg | ORAL_TABLET | Freq: Every day | ORAL | Status: DC

## 2015-06-19 DIAGNOSIS — Z79891 Long term (current) use of opiate analgesic: Secondary | ICD-10-CM | POA: Diagnosis not present

## 2015-06-19 LAB — VITAMIN B12: Vitamin B-12: 544 pg/mL (ref 211–911)

## 2015-06-19 LAB — LIPID PANEL
CHOL/HDL RATIO: 2.1 ratio (ref <=5.0)
CHOLESTEROL: 168 mg/dL (ref 125–200)
HDL: 80 mg/dL (ref >=46)
LDL Cholesterol: 77 mg/dL (ref <130)
TRIGLYCERIDES: 57 mg/dL (ref <150)
VLDL: 11 mg/dL (ref <30)

## 2015-06-20 DIAGNOSIS — R300 Dysuria: Secondary | ICD-10-CM | POA: Insufficient documentation

## 2015-06-20 DIAGNOSIS — R5383 Other fatigue: Secondary | ICD-10-CM | POA: Insufficient documentation

## 2015-06-20 DIAGNOSIS — K5900 Constipation, unspecified: Secondary | ICD-10-CM | POA: Insufficient documentation

## 2015-06-20 DIAGNOSIS — Z8249 Family history of ischemic heart disease and other diseases of the circulatory system: Secondary | ICD-10-CM | POA: Insufficient documentation

## 2015-06-20 DIAGNOSIS — Z780 Asymptomatic menopausal state: Secondary | ICD-10-CM | POA: Insufficient documentation

## 2015-06-20 DIAGNOSIS — K5903 Drug induced constipation: Secondary | ICD-10-CM | POA: Insufficient documentation

## 2015-06-20 LAB — URINE CULTURE
COLONY COUNT: NO GROWTH
ORGANISM ID, BACTERIA: NO GROWTH

## 2015-06-20 LAB — VITAMIN D 25 HYDROXY (VIT D DEFICIENCY, FRACTURES): Vit D, 25-Hydroxy: 45 ng/mL (ref 30–100)

## 2015-06-20 NOTE — Progress Notes (Signed)
Subjective:    [name] is a 50 y.o. female who presents for Medicare Initial preventive examination.  Preventive Screening-Counseling & Management  Tobacco History  Smoking status  . Current Every Day Smoker -- 0.50 packs/day for 25 years  . Types: Cigarettes  Smokeless tobacco  . Never Used     Problems Prior to Visit 1.Intersitial cystitis ongoing however dysuria has been worse over past few days.   2. Ongoing contipation with pain meds. miralax not working any morning. On average one bowel movement a week.   Current Problems (verified) Patient Active Problem List   Diagnosis Date Noted  . Post-menopausal 06/20/2015  . Family history of cardiovascular disease 06/20/2015  . Constipation 06/20/2015  . No energy 06/20/2015  . Dysuria 06/20/2015  . Status post implantation of urinary electronic stimulator device 05/05/2015  . Pain in joint, ankle and foot 04/21/2015  . Incontinence in female 06/26/2014  . Chronic bilateral lower abdominal pain 06/26/2014  . Patellar tendinitis of right knee 12/20/2013  . IC (interstitial cystitis) 04/13/2013  . Lower abdominal pain 07/26/2012  . Chronic pain syndrome 06/21/2012  . Fibromyalgia 06/21/2012  . Carpal tunnel syndrome, bilateral 06/21/2012  . Degenerative disc disease, cervical 06/21/2012  . Facet arthropathy, cervical ([place]) 06/21/2012  . DDD (degenerative disc disease), lumbosacral 06/21/2012  . EBV infection 04/21/2012  . Lymphocytic colitis 12/01/2011  . Depressive disorder, not elsewhere classified 11/06/2010  . NEPHROLITHIASIS 03/06/2010  . OVARIAN CYST 03/06/2010  . TOBACCO ABUSE 05/28/2009  . MITRAL VALVE PROLAPSE 05/28/2009  . GASTRIC ULCER 05/26/2009  . HEART MURMUR, HX OF 05/26/2009  . UNSPECIFIED CARDIAC DYSRHYTHMIA 05/16/2009  . PALPITATIONS 05/16/2009  . HEMATURIA UNSPECIFIED 12/19/2008  . TMJ PAIN 07/26/2008    Medications Prior to Visit Current Outpatient Prescriptions on File Prior to Visit   Medication Sig Dispense Refill  . atenolol (TENORMIN) 50 MG tablet Take 1/2 tablet daily 90 tablet 3  . citalopram (CELEXA) 20 MG tablet Take 40 mg by mouth every morning.    . hydrOXYzine (ATARAX/VISTARIL) 25 MG tablet Take 25 mg by mouth 3 (three) times daily as needed.    . lidocaine (LIDODERM) 5 % Place 1 patch onto the skin daily as needed (For pain.).     [name] morphine (MS CONTIN) 30 MG 12 hr tablet Take 30 mg by mouth every 12 (twelve) hours.    . nitrofurantoin (MACRODANTIN) 50 MG capsule Take 50 mg by mouth daily.    . pentosan polysulfate (ELMIRON) 100 MG capsule Take 100 mg by mouth 2 (two) times daily. Take two tablets by mouth twice a day    . perphenazine (TRILAFON) 2 MG tablet Take 2 mg by mouth 2 (two) times daily.    . pramipexole (MIRAPEX) 0.125 MG tablet TAKE ONE TABLET BY MOUTH AT BEDTIME AS NEEDED FOR RESTLESS LEGS 30 tablet 0  . Tapentadol HCl 75 MG TABS Take by mouth daily. 1 tablet in the afternoon     No current facility-administered medications on file prior to visit.    Current Medications (verified) Current Outpatient Prescriptions  Medication Sig Dispense Refill  . atenolol (TENORMIN) 50 MG tablet Take 1/2 tablet daily 90 tablet 3  . citalopram (CELEXA) 20 MG tablet Take 40 mg by mouth every morning.    . hydrOXYzine (ATARAX/VISTARIL) 25 MG tablet Take 25 mg by mouth 3 (three) times daily as needed.    . lidocaine (LIDODERM) 5 % Place 1 patch onto the skin daily as needed (For pain.).     [name]  morphine (MS CONTIN) 30 MG 12 hr tablet Take 30 mg by mouth every 12 (twelve) hours.    . nitrofurantoin (MACRODANTIN) 50 MG capsule Take 50 mg by mouth daily.    . pentosan polysulfate (ELMIRON) 100 MG capsule Take 100 mg by mouth 2 (two) times daily. Take two tablets by mouth twice a day    . perphenazine (TRILAFON) 2 MG tablet Take 2 mg by mouth 2 (two) times daily.    . pramipexole (MIRAPEX) 0.125 MG tablet TAKE ONE TABLET BY MOUTH AT BEDTIME AS NEEDED FOR RESTLESS LEGS 30  tablet 0  . Tapentadol HCl 75 MG TABS Take by mouth daily. 1 tablet in the afternoon    . naloxegol oxalate (MOVANTIK) 25 MG TABS tablet Take 1 tablet (25 mg total) by mouth daily. 30 tablet 5   No current facility-administered medications for this visit.     Allergies (verified) Gabapentin; Lyrica; Ambien; and Amitriptyline   PAST HISTORY  Family History Family History  Problem Relation Age of Onset  . Heart attack Mother   . Cirrhosis Mother   . Cancer Father     tongue  . Cirrhosis Father   . Colon cancer Neg Hx   . Esophageal cancer Neg Hx   . Rectal cancer Neg Hx   . Stomach cancer Neg Hx     Social History Social History  Substance Use Topics  . Smoking status: Current Every Day Smoker -- 0.50 packs/day for 25 years    Types: Cigarettes  . Smokeless tobacco: Never Used  . Alcohol Use: 0.0 oz/week     Comment: rarely     Are there smokers in your home (other than you)? Yes  Risk Factors Current exercise habits: The patient does not participate in regular exercise at present.  Dietary issues discussed: none   Cardiac risk factors: family history of premature cardiovascular disease, sedentary lifestyle and smoking/ tobacco exposure.  Depression Screen (Note: if answer to either of the following is "Yes", a more complete depression screening is indicated)   Over the past 2 weeks, have you felt down, depressed or hopeless? Yes  Over the past 2 weeks, have you felt little interest or pleasure in doing things? Yes  Have you lost interest or pleasure in daily life? Yes  Do you often feel hopeless? Yes  Do you cry easily over simple problems? Yes  Activities of Daily Living In your present state of health, do you have any difficulty performing the following activities?:  Driving? Yes Managing money?  No Feeding yourself? No Getting from bed to chair? No Climbing a flight of stairs? Yes Preparing food and eating?: No Bathing or showering? No Getting dressed:  No Getting to the toilet? No Using the toilet:No Moving around from place to place: No In the past year have you fallen or had a near fall?:No   Are you sexually active?  Yes  Do you have more than one partner?  No  Hearing Difficulties: Yes Do you often ask people to speak up or repeat themselves? Yes Do you experience ringing or noises in your ears? No Do you have difficulty understanding soft or whispered voices? Yes   Do you feel that you have a problem with memory? Yes  Do you often misplace items? Yes  Do you feel safe at home?  Yes  Cognitive Testing  Alert? Yes  Normal Appearance?Yes  Oriented to person? Yes  Place? Yes   Time? Yes  Recall of three objects?  Yes  Can perform simple calculations? Yes  Displays appropriate judgment?Yes  Can read the correct time from a watch face?Yes   Advanced Directives have been discussed with the patient? Yes  List the Names of Other Physician/Practitioners you currently use: 1.  Dr. Alona Bene 2. [facility].   Indicate any recent Medical Services you may have received from other than [facility] providers in the past year (date may be approximate).  Immunization History  Administered Date(s) Administered  . Influenza,inj,Quad PF,36+ Mos 02/24/2015  . PPD Test 05/29/2012  . Td 05/16/2009  . Tdap 09/08/2011    Screening Tests Health Maintenance  Topic Date Due  . HIV Screening  11/24/1979  . PAP SMEAR  01/09/2016  . INFLUENZA VACCINE  01/13/2016  . MAMMOGRAM  02/26/2017  . TETANUS/TDAP  09/07/2021  . COLONOSCOPY  04/02/2025    All answers were reviewed with the patient and necessary referrals were made:  Iran Planas, PA-C   06/20/2015   History reviewed: allergies, current medications, past family history, past medical history, past social history, past surgical history and problem list  Review of Systems Pertinent items noted in HPI and remainder of comprehensive ROS otherwise negative.    Objective:     Vision  by Snellen chart: right [id number] eye exam GARBER, left [id number] eye exam garber eye  Body mass index is 29.87 kg/(m^2). BP 97/64 mmHg  Pulse 83  Temp(Src) 97.9 F (36.6 C) (Oral)  Wt 185 lb (83.915 kg)  SpO2 98%  BP 97/64 mmHg  Pulse 83  Temp(Src) 97.9 F (36.6 C) (Oral)  Wt 185 lb (83.915 kg)  SpO2 98%  General Appearance:    Alert, cooperative, no distress, appears stated age  Head:    Normocephalic, without obvious abnormality, atraumatic  Eyes:    PERRL, conjunctiva/corneas clear, EOM's intact, fundi    benign, both eyes  Ears:    Normal TM's and external ear canals, both ears  Nose:   Nares normal, septum midline, mucosa normal, no drainage    or sinus tenderness  Throat:   Lips, mucosa, and tongue normal; teeth and gums normal  Neck:   Supple, symmetrical, trachea midline, no adenopathy;    thyroid:  no enlargement/tenderness/nodules; no carotid   bruit or JVD  Back:     Symmetric, no curvature, ROM normal, no CVA tenderness  Lungs:     Clear to auscultation bilaterally, respirations unlabored  Chest Wall:    No tenderness or deformity   Heart:    Regular rate and rhythm, S1 and S2 normal, no murmur, rub   or gallop  Breast Exam:    Not done. Recent mammogram normal.   Abdomen:     Soft, very tender in bilateral lower abdominal pain in all 4 quadrants.  bowel sounds active all four quadrants,    no masses, no organomegaly  Genitalia:  Not done.   Rectal:  Not done.   Extremities:   Extremities normal, atraumatic, no cyanosis or edema  Pulses:   2+ and symmetric all extremities  Skin:   Skin color, texture, turgor normal, no rashes or lesions  Lymph nodes:   Cervical, supraclavicular, and axillary nodes normal  Neurologic:   CNII-XII intact, normal strength, sensation and reflexes    throughout       Assessment:     Woman with chronic IC.      Plan:     During the course of the visit the patient was educated and counseled about appropriate screening  and  preventive services including:    Influenza vaccine  Screening mammography  Colorectal cancer screening  Smoking cessation counseling  Depression- pt declined any intervention. Ongoing IC creates of lot of her depression symptoms. She is on celexa.   IC- managed by Alona Bene.   Dysuria- negative for nitrates and leuks. Positive for blood. Will culture and treat if any bacteria found.   Constipation due to opiods- movantik was given with coupon card.   No energy- will check some metabolic labs.    Patient Instructions (the written plan) was given to the patient.  Medicare Attestation I have personally reviewed: The patient's medical and social history Their use of alcohol, tobacco or illicit drugs Their current medications and supplements The patient's functional ability including ADLs,fall risks, home safety risks, cognitive, and hearing and visual impairment Diet and physical activities Evidence for depression or mood disorders  The patient's weight, height, BMI, and visual acuity have been recorded in the chart.  I have made referrals, counseling, and provided education to the patient based on review of the above and I have provided the patient with a written personalized care plan for preventive services.     Iran Planas, PA-C

## 2015-06-27 DIAGNOSIS — M25579 Pain in unspecified ankle and joints of unspecified foot: Secondary | ICD-10-CM | POA: Diagnosis not present

## 2015-06-27 DIAGNOSIS — R700 Elevated erythrocyte sedimentation rate: Secondary | ICD-10-CM | POA: Diagnosis not present

## 2015-07-04 ENCOUNTER — Ambulatory Visit (INDEPENDENT_AMBULATORY_CARE_PROVIDER_SITE_OTHER): Payer: BLUE CROSS/BLUE SHIELD | Admitting: Physician Assistant

## 2015-07-04 ENCOUNTER — Encounter: Payer: Self-pay | Admitting: Physician Assistant

## 2015-07-04 VITALS — BP 113/68 | HR 67 | Ht 66.0 in

## 2015-07-04 DIAGNOSIS — R221 Localized swelling, mass and lump, neck: Secondary | ICD-10-CM | POA: Diagnosis not present

## 2015-07-04 DIAGNOSIS — R1310 Dysphagia, unspecified: Secondary | ICD-10-CM | POA: Diagnosis not present

## 2015-07-04 MED ORDER — PREDNISONE 50 MG PO TABS: ORAL_TABLET | ORAL | Status: DC

## 2015-07-04 NOTE — Progress Notes (Signed)
   Subjective:    Patient ID: [name], female    DOB: [birth year], 50 y.o.   MRN: [id number]  HPI  Pt is a 50 yo who presents to the clinic with sensation of lump in throat and problems swallowing. Symptoms started 3 days ago when she was at her hair dresser's. She was sucking on a mint and some of the juices seemed to go down wrong way. She started choking and could not breathe. She spit out mint but did not help. It took a few minutes to get her breath back. Since then her throat hurts and feels like something is stuck in there. Family hx of tongue/throat cancer scares her. She gets choked now on all food that are not soft. She can't eat any meat right now without choking.      Review of Systems  All other systems reviewed and are negative.      Objective:   Physical Exam  Constitutional: She is oriented to person, place, and time. She appears well-developed and well-nourished.  HENT:  Head: Normocephalic and atraumatic.  Mouth/Throat: Oropharynx is clear and moist. No oropharyngeal exudate.  Eyes: Conjunctivae are normal. Right eye exhibits no discharge. Left eye exhibits no discharge.  Neck: Normal range of motion. Neck supple. No JVD present. No tracheal deviation present. No thyromegaly present.  Cardiovascular: Normal rate, regular rhythm and normal heart sounds.   Pulmonary/Chest: Effort normal and breath sounds normal. No stridor. She has no wheezes.  Lymphadenopathy:    She has no cervical adenopathy.  Neurological: She is alert and oriented to person, place, and time.  Psychiatric: She has a normal mood and affect. Her behavior is normal.          Assessment & Plan:  Sensation of lump in throat/trouble swallowing- will start with barium swallow. Certainly if got choked throat could be sore for a few days. Keep to soft foods until after testing then may consider slowly progressing to solids again. prednisone given for 5 days to decrease swelling this may help as  well. Follow up as needed. Red flags discussed.

## 2015-07-05 ENCOUNTER — Other Ambulatory Visit: Payer: Self-pay | Admitting: Physician Assistant

## 2015-07-07 ENCOUNTER — Encounter: Payer: Self-pay | Admitting: Physician Assistant

## 2015-07-08 ENCOUNTER — Other Ambulatory Visit: Payer: Self-pay | Admitting: Physician Assistant

## 2015-07-08 ENCOUNTER — Other Ambulatory Visit: Payer: Self-pay | Admitting: *Deleted

## 2015-07-08 DIAGNOSIS — R221 Localized swelling, mass and lump, neck: Secondary | ICD-10-CM

## 2015-07-08 DIAGNOSIS — R1310 Dysphagia, unspecified: Secondary | ICD-10-CM

## 2015-07-09 ENCOUNTER — Other Ambulatory Visit: Payer: Self-pay | Admitting: *Deleted

## 2015-07-09 DIAGNOSIS — G8929 Other chronic pain: Secondary | ICD-10-CM | POA: Diagnosis not present

## 2015-07-09 DIAGNOSIS — N3010 Interstitial cystitis (chronic) without hematuria: Secondary | ICD-10-CM | POA: Diagnosis not present

## 2015-07-09 DIAGNOSIS — Z79891 Long term (current) use of opiate analgesic: Secondary | ICD-10-CM | POA: Diagnosis not present

## 2015-07-09 DIAGNOSIS — R109 Unspecified abdominal pain: Secondary | ICD-10-CM | POA: Diagnosis not present

## 2015-07-09 MED ORDER — PRAMIPEXOLE DIHYDROCHLORIDE 0.125 MG PO TABS: ORAL_TABLET | ORAL | Status: DC

## 2015-07-10 ENCOUNTER — Encounter: Payer: Self-pay | Admitting: Physician Assistant

## 2015-07-11 ENCOUNTER — Other Ambulatory Visit: Payer: Self-pay | Admitting: *Deleted

## 2015-07-11 MED ORDER — CITALOPRAM HYDROBROMIDE 40 MG PO TABS: 40.0000 mg | ORAL_TABLET | Freq: Every morning | ORAL | Status: DC

## 2015-07-15 ENCOUNTER — Other Ambulatory Visit: Payer: Self-pay | Admitting: *Deleted

## 2015-07-15 MED ORDER — CITALOPRAM HYDROBROMIDE 40 MG PO TABS: 40.0000 mg | ORAL_TABLET | Freq: Every morning | ORAL | Status: DC

## 2015-07-16 ENCOUNTER — Encounter: Payer: Self-pay | Admitting: Physician Assistant

## 2015-07-16 DIAGNOSIS — K449 Diaphragmatic hernia without obstruction or gangrene: Secondary | ICD-10-CM | POA: Insufficient documentation

## 2015-07-16 NOTE — Telephone Encounter (Signed)
Barium swallow showed hiatal hernia. This does not correlate with symptoms. Can cause GERD symptoms. We could try zantac and or omeprazole to help with this. Can we send over? No stricture or stenosis.

## 2015-07-21 ENCOUNTER — Encounter: Payer: Self-pay | Admitting: Physician Assistant

## 2015-07-22 ENCOUNTER — Encounter: Payer: Self-pay | Admitting: Physician Assistant

## 2015-07-22 DIAGNOSIS — R221 Localized swelling, mass and lump, neck: Secondary | ICD-10-CM

## 2015-07-22 DIAGNOSIS — R1310 Dysphagia, unspecified: Secondary | ICD-10-CM

## 2015-08-05 ENCOUNTER — Encounter: Payer: Self-pay | Admitting: Physician Assistant

## 2015-09-03 ENCOUNTER — Encounter: Payer: Self-pay | Admitting: Physician Assistant

## 2015-09-03 ENCOUNTER — Ambulatory Visit (INDEPENDENT_AMBULATORY_CARE_PROVIDER_SITE_OTHER): Payer: BLUE CROSS/BLUE SHIELD | Admitting: Physician Assistant

## 2015-09-03 VITALS — BP 90/50 | HR 64 | Temp 98.7°F | Wt 180.0 lb

## 2015-09-03 DIAGNOSIS — N3010 Interstitial cystitis (chronic) without hematuria: Secondary | ICD-10-CM

## 2015-09-03 DIAGNOSIS — H9202 Otalgia, left ear: Secondary | ICD-10-CM

## 2015-09-03 DIAGNOSIS — F329 Major depressive disorder, single episode, unspecified: Secondary | ICD-10-CM

## 2015-09-03 DIAGNOSIS — R300 Dysuria: Secondary | ICD-10-CM | POA: Diagnosis not present

## 2015-09-03 DIAGNOSIS — F32A Depression, unspecified: Secondary | ICD-10-CM | POA: Insufficient documentation

## 2015-09-03 LAB — POCT URINALYSIS DIPSTICK
Bilirubin, UA: NEGATIVE
GLUCOSE UA: NEGATIVE
LEUKOCYTES UA: NEGATIVE
NITRITE UA: NEGATIVE
Protein, UA: NEGATIVE
SPEC GRAV UA: 1.025
UROBILINOGEN UA: NEGATIVE
pH, UA: 5.5

## 2015-09-03 MED ORDER — PHENAZOPYRIDINE HCL 200 MG PO TABS
200.0000 mg | ORAL_TABLET | Freq: Three times a day (TID) | ORAL | Status: DC | PRN
Start: 2015-09-03 — End: 2017-03-16

## 2015-09-03 MED ORDER — DULOXETINE HCL 30 MG PO CPEP: 30.0000 mg | ORAL_CAPSULE | Freq: Every day | ORAL | Status: DC

## 2015-09-03 MED ORDER — HYDROCORTISONE-ACETIC ACID 1-2 % OT SOLN: 4.0000 [drp] | Freq: Three times a day (TID) | OTIC | Status: DC

## 2015-09-03 NOTE — Patient Instructions (Signed)
Celexa 1/2 tablet with 1 tablet for cymbalta for 7 days then increase to 2 tablets of cymbalta and stop celexa.

## 2015-09-03 NOTE — Progress Notes (Signed)
   Subjective:    Patient ID: [name], female    DOB: 09-Jan-1966, 50 y.o.   MRN: [id number]  HPI  Pt presents to the clinic to make sure she does not have UTI. She has IC and chronic pain. She just finished a flare. For last 2 days experienced more dysuria. No fever, chills, body aches. Not done anything to make better.   She also had left ear pain for last 3 days. Had a head cold last week. Those symptoms improved. No discharge from ears. Hurts to touch ear.   She is more down overall. She has been battling this IC disease and tired of feeling bad. She has been on celexa for many years. Would like to try something else. Denies any suicidal or homicidal thoughts.    Review of Systems See HPI.    Objective:   Physical Exam  Constitutional: She is oriented to person, place, and time. She appears well-developed and well-nourished.  HENT:  Head: Normocephalic and atraumatic.  Right Ear: External ear normal.  Nose: Nose normal.  Mouth/Throat: Oropharynx is clear and moist. No oropharyngeal exudate.  TM clear right.  Left TM clear. Tenderness over tragus to palpation. Appears to be erythema in canal with a red papule that could be turning into a pustule.  Cardiovascular: Normal rate, regular rhythm and normal heart sounds.   Pulmonary/Chest:  No CVA tenderness.   Abdominal:  Abdominal pain- extreme pain with all palpation in all 4 quadrants.   Neurological: She is alert and oriented to person, place, and time.  Skin: Skin is dry.  Psychiatric: She has a normal mood and affect. Her behavior is normal.          Assessment & Plan:  IC/dysuria- .. Results for orders placed or performed in visit on 09/03/15  Urinalysis Dipstick  Result Value Ref Range   Color, UA orange    Clarity, UA cloudy    Glucose, UA negative    Bilirubin, UA negative    Ketones, UA trace    Spec Grav, UA 1.025    Blood, UA moderate    pH, UA 5.5    Protein, UA negative    Urobilinogen, UA  negative    Nitrite, UA negative    Leukocytes, UA Negative Negative   Ongoing blood in urine. With no other signs of infection will wait for culture to treat. Did send pyridium for inflammation.   Depression- taper off celexa. Start cymbalta 30mg  and then increase to 60mg  daily. Follow up in 4-6 weeks. Discussed side effects. Call with any worsening mood symptoms.   Left ear pain- no infection seen. Tenderness over tragus and erythema in external canal. Appears like might be a white head forming in external canal. Treated with warm compresses and acetic acid drops.

## 2015-09-05 LAB — URINE CULTURE: Colony Count: 4000

## 2015-09-09 ENCOUNTER — Other Ambulatory Visit: Payer: Self-pay | Admitting: *Deleted

## 2015-09-09 ENCOUNTER — Encounter: Payer: Self-pay | Admitting: Physician Assistant

## 2015-09-09 MED ORDER — PROMETHAZINE HCL 25 MG PO TABS: 12.5000 mg | ORAL_TABLET | Freq: Four times a day (QID) | ORAL | Status: DC | PRN

## 2015-09-11 ENCOUNTER — Telehealth: Payer: Self-pay | Admitting: *Deleted

## 2015-09-11 NOTE — Telephone Encounter (Signed)
Cymbalta approved by insurance through 08/09/15 through 09/07/2018. Per letter the insurance company has let the patient know about this. Approval letter sent to scan

## 2015-10-27 ENCOUNTER — Other Ambulatory Visit: Payer: Self-pay | Admitting: Physician Assistant

## 2015-11-24 ENCOUNTER — Other Ambulatory Visit: Payer: Self-pay | Admitting: Physician Assistant

## 2015-11-28 ENCOUNTER — Other Ambulatory Visit: Payer: Self-pay | Admitting: *Deleted

## 2015-11-28 MED ORDER — DULOXETINE HCL 30 MG PO CPEP: 60.0000 mg | ORAL_CAPSULE | Freq: Every day | ORAL | Status: DC

## 2015-12-01 ENCOUNTER — Other Ambulatory Visit: Payer: Self-pay | Admitting: *Deleted

## 2015-12-01 MED ORDER — DULOXETINE HCL 30 MG PO CPEP: 60.0000 mg | ORAL_CAPSULE | Freq: Every day | ORAL | Status: DC

## 2015-12-04 ENCOUNTER — Other Ambulatory Visit: Payer: Self-pay | Admitting: *Deleted

## 2015-12-04 MED ORDER — DULOXETINE HCL 30 MG PO CPEP: 60.0000 mg | ORAL_CAPSULE | Freq: Every day | ORAL | Status: DC

## 2016-02-26 ENCOUNTER — Other Ambulatory Visit: Payer: Self-pay | Admitting: Physician Assistant

## 2016-02-27 ENCOUNTER — Ambulatory Visit (INDEPENDENT_AMBULATORY_CARE_PROVIDER_SITE_OTHER): Payer: BLUE CROSS/BLUE SHIELD | Admitting: Sports Medicine

## 2016-02-27 ENCOUNTER — Ambulatory Visit (INDEPENDENT_AMBULATORY_CARE_PROVIDER_SITE_OTHER): Payer: BLUE CROSS/BLUE SHIELD

## 2016-02-27 ENCOUNTER — Encounter: Payer: Self-pay | Admitting: Sports Medicine

## 2016-02-27 DIAGNOSIS — M19042 Primary osteoarthritis, left hand: Secondary | ICD-10-CM

## 2016-02-27 MED ORDER — MELOXICAM 15 MG PO TABS: ORAL_TABLET | ORAL | 3 refills | Status: DC

## 2016-02-27 NOTE — Progress Notes (Signed)
   Subjective:    I'm seeing this patient as a consultation for:  Shelly Planas, PA-C  CC: Left hand pain  HPI: For the past several weeks this pleasant 50 year old female has had increasing pain and swelling in her left second metacarpophalangeal joint, to the point where type, swollen, and she can't move it. No constitutional symptoms, no trauma.  Past medical history, Surgical history, Family history not pertinant except as noted below, Social history, Allergies, and medications have been entered into the medical record, reviewed, and no changes needed.   Review of Systems: No headache, visual changes, nausea, vomiting, diarrhea, constipation, dizziness, abdominal pain, skin rash, fevers, chills, night sweats, weight loss, swollen lymph nodes, body aches, joint swelling, muscle aches, chest pain, shortness of breath, mood changes, visual or auditory hallucinations.   Objective:   General: Well Developed, well nourished, and in no acute distress.  Neuro/Psych: Alert and oriented x3, extra-ocular muscles intact, able to move all 4 extremities, sensation grossly intact. Skin: Warm and dry, no rashes noted.  Respiratory: Not using accessory muscles, speaking in full sentences, trachea midline.  Cardiovascular: Pulses palpable, no extremity edema. Abdomen: Does not appear distended. Left hand: Visibly swollen second MCP, tender to palpation and with passive movement. No warmth or erythema.  Procedure: Real-time Ultrasound Guided Injection of left second MCP Device: GE Logiq E  Verbal informed consent obtained.  Time-out conducted.  Noted no overlying erythema, induration, or other signs of local infection.  Skin prepped in a sterile fashion.  Local anesthesia: Topical Ethyl chloride.  With sterile technique and under real time ultrasound guidance:  Noted synovitis and mild osteoarthritis, 30-gauge needle advanced into the joint and 1/2 mL kenalog 40, 1/2 mL lidocaine injected  easily. Completed without difficulty  Pain immediately resolved suggesting accurate placement of the medication.  Advised to call if fevers/chills, erythema, induration, drainage, or persistent bleeding.  Images permanently stored and available for review in the ultrasound unit.  Impression: Technically successful ultrasound guided injection.  Impression and Recommendations:   This case required medical decision making of moderate complexity.  Primary osteoarthritis of left hand Left second MCP. X-rays, meloxicam, injection as above. Return in one month.

## 2016-02-27 NOTE — Assessment & Plan Note (Signed)
Left second MCP. X-rays, meloxicam, injection as above. Return in one month.

## 2016-02-27 NOTE — Telephone Encounter (Signed)
Needs follow up within next 3 months.

## 2016-03-26 ENCOUNTER — Ambulatory Visit: Payer: BLUE CROSS/BLUE SHIELD | Admitting: Sports Medicine

## 2016-04-07 ENCOUNTER — Encounter: Payer: Self-pay | Admitting: Physician Assistant

## 2016-04-07 ENCOUNTER — Other Ambulatory Visit: Payer: Self-pay | Admitting: Physician Assistant

## 2016-04-07 MED ORDER — VARENICLINE TARTRATE 0.5 MG X 11 & 1 MG X 42 PO MISC: ORAL | 0 refills | Status: DC

## 2016-04-07 NOTE — Progress Notes (Signed)
Pt sent a mychart msg wanting to quit smoking. Pt was sent HO with side effects and directions.

## 2016-05-05 ENCOUNTER — Other Ambulatory Visit: Payer: Self-pay | Admitting: Physician Assistant

## 2016-05-23 ENCOUNTER — Other Ambulatory Visit: Payer: Self-pay | Admitting: Cardiology

## 2016-06-02 ENCOUNTER — Ambulatory Visit: Payer: BLUE CROSS/BLUE SHIELD

## 2016-06-02 ENCOUNTER — Encounter: Payer: Self-pay | Admitting: Physician Assistant

## 2016-06-02 ENCOUNTER — Ambulatory Visit (INDEPENDENT_AMBULATORY_CARE_PROVIDER_SITE_OTHER): Payer: BLUE CROSS/BLUE SHIELD | Admitting: Physician Assistant

## 2016-06-02 VITALS — BP 102/59 | HR 76 | Wt 175.0 lb

## 2016-06-02 DIAGNOSIS — N3001 Acute cystitis with hematuria: Secondary | ICD-10-CM | POA: Diagnosis not present

## 2016-06-02 DIAGNOSIS — N3010 Interstitial cystitis (chronic) without hematuria: Secondary | ICD-10-CM

## 2016-06-02 DIAGNOSIS — M2669 Other specified disorders of temporomandibular joint: Secondary | ICD-10-CM | POA: Diagnosis not present

## 2016-06-02 LAB — POCT URINALYSIS DIPSTICK
GLUCOSE UA: NEGATIVE
Nitrite, UA: NEGATIVE
Protein, UA: NEGATIVE
SPEC GRAV UA: 1.025
UROBILINOGEN UA: 0.2
pH, UA: 5.5

## 2016-06-02 MED ORDER — SULFAMETHOXAZOLE-TRIMETHOPRIM 800-160 MG PO TABS: 1.0000 | ORAL_TABLET | Freq: Two times a day (BID) | ORAL | 0 refills | Status: DC

## 2016-06-02 NOTE — Patient Instructions (Signed)
Temporomandibular Joint Syndrome Temporomandibular joint (TMJ) syndrome is a condition that affects the joints between your jaw and your skull. The TMJs are located near your ears and allow your jaw to open and close. These joints and the nearby muscles are involved in all movements of the jaw. People with TMJ syndrome have pain in the area of these joints and muscles. Chewing, biting, or other movements of the jaw can be difficult or painful. TMJ syndrome can be caused by various things. In many cases, the condition is mild and goes away within a few weeks. For some people, the condition can become a long-term problem. What are the causes? Possible causes of TMJ syndrome include:  Grinding your teeth or clenching your jaw. Some people do this when they are under stress.  Arthritis.  Injury to the jaw.  Head or neck injury.  Teeth or dentures that are not aligned well. In some cases, the cause of TMJ syndrome may not be known. What are the signs or symptoms? The most common symptom is an aching pain on the side of the head in the area of the TMJ. Other symptoms may include:  Pain when moving your jaw, such as when chewing or biting.  Being unable to open your jaw all the way.  Making a clicking sound when you open your mouth.  Headache.  Earache.  Neck or shoulder pain. How is this diagnosed? Diagnosis can usually be made based on your symptoms, your medical history, and a physical exam. Your health care provider may check the range of motion of your jaw. Imaging tests, such as X-rays or an MRI, are sometimes done. You may need to see your dentist to determine if your teeth and jaw are lined up correctly. How is this treated? TMJ syndrome often goes away on its own. If treatment is needed, the options may include:  Eating soft foods and applying ice or heat.  Medicines to relieve pain or inflammation.  Medicines to relax the muscles.  A splint, bite plate, or mouthpiece to  prevent teeth grinding or jaw clenching.  Relaxation techniques or counseling to help reduce stress.  Transcutaneous electrical nerve stimulation (TENS). This helps to relieve pain by applying an electrical current through the skin.  Acupuncture. This is sometimes helpful to relieve pain.  Jaw surgery. This is rarely needed. Follow these instructions at home:  Take medicines only as directed by your health care provider.  Eat a soft diet if you are having trouble chewing.  Apply ice to the painful area.  Put ice in a plastic bag.  Place a towel between your skin and the bag.  Leave the ice on for 20 minutes, 2-3 times a day.  Apply a warm compress to the painful area as directed.  Massage your jaw area and perform any jaw stretching exercises as recommended by your health care provider.  If you were given a mouthpiece or bite plate, wear it as directed.  Avoid foods that require a lot of chewing. Do not chew gum.  Keep all follow-up visits as directed by your health care provider. This is important. Contact a health care provider if:  You are having trouble eating.  You have new or worsening symptoms. Get help right away if:  Your jaw locks open or closed. This information is not intended to replace advice given to you by your health care provider. Make sure you discuss any questions you have with your health care provider. Document Released: 02/23/2001 Document  Revised: 01/29/2016 Document Reviewed: 01/03/2014 [employer] Interactive Patient Education  2017 [employer]  

## 2016-06-02 NOTE — Progress Notes (Signed)
   Subjective:    Patient ID: [name], female    DOB: 08/22/1965, 50 y.o.   MRN: [id number]  HPI  Pt is a 50 yo female with Interstitial cystitis who presents to the clinic with lower abdominal cramping and dysuria for 5 days. She sees Dr. Amalia Hailey at [facility] for [place] and cystoscopy performed last month. She continually has pain but her IC pain is consient and usually when she has infection she has more dysuria. She was told she has kidney stones sitting in her kidney. She is concerned she has a stone that is lodge. She denies any flank pain. She has no fever, chills, n/v/d.   She continues to smoke chantix caused her abdominal cramping to be worse.   Pt has hx of TMJ. She is having some TMJ pain on left side. She wonders is could be flared up. Is not aware of any trauma to jaw.    Review of Systems See HPI.     Objective:   Physical Exam  Constitutional: She is oriented to person, place, and time. She appears well-developed and well-nourished.  HENT:  Head: Normocephalic and atraumatic.  Cardiovascular: Normal rate, regular rhythm and normal heart sounds.   Pulmonary/Chest: Effort normal and breath sounds normal. She has no wheezes.  NO CVA tenderness.   Abdominal:  Severe tenderness with any palpitating over lower abdomen bilateral.   Neurological: She is alert and oriented to person, place, and time.  Psychiatric: She has a normal mood and affect. Her behavior is normal.          Assessment & Plan:  [name] KitchenMarland KitchenKerstin was seen today for urinary tract infection.  Diagnoses and all orders for this visit:  Acute cystitis with hematuria -     sulfamethoxazole-trimethoprim (BACTRIM DS,SEPTRA DS) 800-160 MG tablet; Take 1 tablet by mouth 2 (two) times daily. For 7 days. -     POCT urinalysis dipstick -     Urine culture  TMJ inflammation  Interstitial cystitis   .[name] Results for orders placed or performed in visit on 06/02/16  POCT urinalysis dipstick  Result Value Ref Range   Color, UA Dark yellow    Clarity, UA slightly cloudy    Glucose, UA neg    Bilirubin, UA small    Ketones, UA trace    Spec Grav, UA 1.025    Blood, UA moderate    pH, UA 5.5    Protein, UA neg    Urobilinogen, UA 0.2    Nitrite, UA neg    Leukocytes, UA small (1+) (A) Negative   Will culture. Due to blood and leuks will treat. Bactrim sent to pharmacy.   Reassured patient I did not feel like symptoms represented a kidney stone that was obstructing. Discussed red flags symptoms.   Discussed symptomatic care for pain of left TMJ.

## 2016-06-04 LAB — URINE CULTURE: ORGANISM ID, BACTERIA: NO GROWTH

## 2016-06-24 ENCOUNTER — Other Ambulatory Visit: Payer: Self-pay | Admitting: *Deleted

## 2016-06-24 MED ORDER — PROMETHAZINE HCL 25 MG PO TABS: ORAL_TABLET | ORAL | 0 refills | Status: DC

## 2016-06-24 MED ORDER — DULOXETINE HCL 30 MG PO CPEP: 60.0000 mg | ORAL_CAPSULE | Freq: Every day | ORAL | 1 refills | Status: DC

## 2016-06-24 MED ORDER — PRAMIPEXOLE DIHYDROCHLORIDE 0.125 MG PO TABS: ORAL_TABLET | ORAL | 3 refills | Status: DC

## 2016-06-28 ENCOUNTER — Other Ambulatory Visit: Payer: Self-pay | Admitting: *Deleted

## 2016-06-28 MED ORDER — DULOXETINE HCL 30 MG PO CPEP: 60.0000 mg | ORAL_CAPSULE | Freq: Every day | ORAL | 0 refills | Status: DC

## 2016-07-09 ENCOUNTER — Other Ambulatory Visit: Payer: Self-pay | Admitting: Physician Assistant

## 2016-07-13 ENCOUNTER — Encounter: Payer: Self-pay | Admitting: Physician Assistant

## 2016-07-14 ENCOUNTER — Other Ambulatory Visit: Payer: Self-pay | Admitting: Physician Assistant

## 2016-07-14 MED ORDER — OSELTAMIVIR PHOSPHATE 75 MG PO CAPS: 75.0000 mg | ORAL_CAPSULE | Freq: Two times a day (BID) | ORAL | 0 refills | Status: DC

## 2016-07-15 ENCOUNTER — Ambulatory Visit (INDEPENDENT_AMBULATORY_CARE_PROVIDER_SITE_OTHER): Payer: Medicare HMO | Admitting: Family Medicine

## 2016-07-15 ENCOUNTER — Encounter: Payer: Self-pay | Admitting: Family Medicine

## 2016-07-15 ENCOUNTER — Ambulatory Visit (INDEPENDENT_AMBULATORY_CARE_PROVIDER_SITE_OTHER): Payer: Medicare HMO

## 2016-07-15 VITALS — BP 95/60 | HR 67 | Temp 98.3°F | Wt 183.0 lb

## 2016-07-15 DIAGNOSIS — R059 Cough, unspecified: Secondary | ICD-10-CM

## 2016-07-15 DIAGNOSIS — J9811 Atelectasis: Secondary | ICD-10-CM | POA: Diagnosis not present

## 2016-07-15 DIAGNOSIS — R05 Cough: Secondary | ICD-10-CM | POA: Diagnosis not present

## 2016-07-15 DIAGNOSIS — R69 Illness, unspecified: Secondary | ICD-10-CM | POA: Diagnosis not present

## 2016-07-15 MED ORDER — ALBUTEROL SULFATE (2.5 MG/3ML) 0.083% IN NEBU: 2.5000 mg | INHALATION_SOLUTION | Freq: Four times a day (QID) | RESPIRATORY_TRACT | 1 refills | Status: DC | PRN

## 2016-07-15 MED ORDER — LEVOFLOXACIN 500 MG PO TABS: 500.0000 mg | ORAL_TABLET | Freq: Every day | ORAL | 0 refills | Status: DC

## 2016-07-15 MED ORDER — ALBUTEROL SULFATE HFA 108 (90 BASE) MCG/ACT IN AERS: 2.0000 | INHALATION_SPRAY | Freq: Four times a day (QID) | RESPIRATORY_TRACT | 0 refills | Status: DC | PRN

## 2016-07-15 MED ORDER — BENZONATATE 200 MG PO CAPS
200.0000 mg | ORAL_CAPSULE | Freq: Three times a day (TID) | ORAL | 0 refills | Status: DC | PRN
Start: 2016-07-15 — End: 2016-09-14

## 2016-07-15 MED ORDER — BENZONATATE 200 MG PO CAPS: 200.0000 mg | ORAL_CAPSULE | Freq: Three times a day (TID) | ORAL | 0 refills | Status: DC | PRN

## 2016-07-15 NOTE — Patient Instructions (Signed)
Thank you for coming in today. Call or go to the emergency room if you get worse, have trouble breathing, have chest pains, or palpitations.    Acute Bronchitis, Adult Acute bronchitis is when air tubes (bronchi) in the lungs suddenly get swollen. The condition can make it hard to breathe. It can also cause these symptoms:  A cough.  Coughing up clear, yellow, or green mucus.  Wheezing.  Chest congestion.  Shortness of breath.  A fever.  Body aches.  Chills.  A sore throat. Follow these instructions at home: Medicines  Take over-the-counter and prescription medicines only as told by your doctor.  If you were prescribed an antibiotic medicine, take it as told by your doctor. Do not stop taking the antibiotic even if you start to feel better. General instructions  Rest.  Drink enough fluids to keep your pee (urine) clear or pale yellow.  Avoid smoking and secondhand smoke. If you smoke and you need help quitting, ask your doctor. Quitting will help your lungs heal faster.  Use an inhaler, cool mist vaporizer, or humidifier as told by your doctor.  Keep all follow-up visits as told by your doctor. This is important. How is this prevented? To lower your risk of getting this condition again:  Wash your hands often with soap and water. If you cannot use soap and water, use hand sanitizer.  Avoid contact with people who have cold symptoms.  Try not to touch your hands to your mouth, nose, or eyes.  Make sure to get the flu shot every year. Contact a doctor if:  Your symptoms do not get better in 2 weeks. Get help right away if:  You cough up blood.  You have chest pain.  You have very bad shortness of breath.  You become dehydrated.  You faint (pass out) or keep feeling like you are going to pass out.  You keep throwing up (vomiting).  You have a very bad headache.  Your fever or chills gets worse. This information is not intended to replace advice  given to you by your health care provider. Make sure you discuss any questions you have with your health care provider. Document Released: 11/17/2007 Document Revised: 01/07/2016 Document Reviewed: 11/19/2015 Elsevier Interactive Patient Education  2017 [employer].

## 2016-07-15 NOTE — Progress Notes (Signed)
[name] is a 51 y.o. female who presents to [place] [place] today for fevers chills wheezing fatigue. Symptoms present for about 5 days. Patient received a flu vaccine this year. She has tried some over-the-counter medications which have helped a bit. She denies significant vomiting or diarrhea. She notes that overall she is feeling poorly. She denies chest pain or palpitations however. She thinks she was exposed to influenza. Patient is prescribed Tamiflu by her primary care provider on the 31st over she has not started taking it.   Past Medical History:  Diagnosis Date  . Anxiety   . Cancer (HCC)    cervical  . Colitis    lymphacitic  . Depression   . Dysrhythmia    pvc   . Gastric ulcer   . Headache(784.0)   . Heart murmur   . IBS (irritable bowel syndrome)   . Interstitial cystitis   . MVP (mitral valve prolapse)   . Urinary calculus or stone    Past Surgical History:  Procedure Laterality Date  . APPENDECTOMY  2005  . BILATERAL OOPHORECTOMY  2012  . DILATION AND CURETTAGE OF UTERUS     x 4  one after each SAB  . EXPLORATORY LAPAROTOMY  2007  . LAPAROSCOPY N/A 08/04/2012   Procedure: LAPAROSCOPY DIAGNOSTIC;  Surgeon: Gayland Curry, MD,FACS;  Location: [facility];  Service: General;  Laterality: N/A;  . PARTIAL HYSTERECTOMY  2005  . TONSILLECTOMY  30 years ago  . TUBAL LIGATION  2000   Social History  Substance Use Topics  . Smoking status: Current Every Day Smoker    Packs/day: 0.50    Years: 25.00    Types: Cigarettes  . Smokeless tobacco: Never Used  . Alcohol use 0.0 oz/week     Comment: rarely   family history includes Cancer in her father and paternal uncle; Cirrhosis in her father and mother; Heart attack in her mother.  ROS as above:  Medications: Current Outpatient Prescriptions  Medication Sig Dispense Refill  . acetic  acid-hydrocortisone (VOSOL-HC) otic solution Place 4 drops into the left ear 3 (three) times daily. 10 mL 0  . Alum & Mag Hydroxide-Simeth (GI COCKTAIL) SUSP suspension Take 30 mLs by mouth 2 (two) times daily. Shake well.    . atenolol (TENORMIN) 50 MG tablet TAKE ONE-HALF TABLET BY MOUTH ONCE DAILY 30 tablet 1  . bupivacaine (MARCAINE) 0.5 % SOLN injection Instill 15 cc into bladder daily    . DULoxetine (CYMBALTA) 30 MG capsule Take 2 capsules (60 mg total) by mouth daily. 60 capsule 0  . heparin 10000 UNIT/ML injection USE AS DIRECTED    . hydrOXYzine (ATARAX/VISTARIL) 25 MG tablet Take 25 mg by mouth 3 (three) times daily as needed.    . lidocaine (LIDODERM) 5 % Place 1 patch onto the skin daily as needed (For pain.).     [name] meloxicam (MOBIC) 15 MG tablet One tab PO qAM with breakfast for 2 weeks, then daily prn pain. 30 tablet 3  . morphine (MS CONTIN) 60 MG 12 hr tablet Take 60 mg by mouth.    . naloxegol oxalate (MOVANTIK) 25 MG TABS tablet Take 1 tablet (25 mg total) by mouth daily. 30 tablet 5  . nitrofurantoin (MACRODANTIN) 50 MG capsule TAKE 1 CAPSULE NIGHTLY 90 capsule 3  . pantoprazole (PROTONIX) 40 MG tablet Take 40 mg by mouth daily.    . pentosan polysulfate (ELMIRON) 100 MG capsule  Take 100 mg by mouth 2 (two) times daily. Take two tablets by mouth twice a day    . perphenazine (TRILAFON) 2 MG tablet Take 2 mg by mouth 2 (two) times daily.    . phenazopyridine (PYRIDIUM) 200 MG tablet Take 1 tablet (200 mg total) by mouth 3 (three) times daily as needed for pain. 10 tablet 0  . pramipexole (MIRAPEX) 0.125 MG tablet TAKE 1 TABLET AT BEDTIME AS NEEDED FOR RESTLESS LEGS 90 tablet 3  . promethazine (PHENERGAN) 25 MG tablet TAKE ONE-HALF (1/2) TABLET EVERY 6 HOURS AS NEEDED FOR NAUSEA OR VOMITING 90 tablet 0  . albuterol (PROVENTIL HFA;VENTOLIN HFA) 108 (90 Base) MCG/ACT inhaler Inhale 2 puffs into the lungs every 6 (six) hours as needed for wheezing or shortness of breath. 1 Inhaler 0    . albuterol (PROVENTIL) (2.5 MG/3ML) 0.083% nebulizer solution Take 3 mLs (2.5 mg total) by nebulization every 6 (six) hours as needed for wheezing or shortness of breath. 150 mL 1  . benzonatate (TESSALON) 200 MG capsule Take 1 capsule (200 mg total) by mouth 3 (three) times daily as needed for cough. 45 capsule 0  . levofloxacin (LEVAQUIN) 500 MG tablet Take 1 tablet (500 mg total) by mouth daily. 7 tablet 0   No current facility-administered medications for this visit.    Allergies  Allergen Reactions  . Gabapentin Anaphylaxis    Sleepiness Sleepiness   . Lyrica [Pregabalin] Anaphylaxis    Made symptoms worse.  Made symptoms worse.   . Ambien [Zolpidem] Other (See Comments)    Insomnia Pt did not state her reaction   . Amitriptyline Other (See Comments)    Pt did not state her reaction    Health Maintenance Health Maintenance  Topic Date Due  . HIV Screening  11/24/1979  . PAP SMEAR  01/09/2016  . INFLUENZA VACCINE  01/13/2016  . MAMMOGRAM  02/26/2017  . TETANUS/TDAP  09/07/2021  . COLONOSCOPY  04/02/2025     Exam:  BP 95/60   Pulse 67   Temp 98.3 F (36.8 C) (Oral)   Wt 183 lb (83 kg)   BMI 29.54 kg/m  Gen: Well NAD HEENT: EOMI,  MMM Clear nasal discharge Lungs: Normal work of breathing. Coarse breath sounds throughout.  Prolonged expiratory phase and wheezing also present. Heart: RRR no MRG Abd: NABS, Soft. Nondistended, Nontender Exts: Brisk capillary refill, warm and well perfused.   Patient was given a 2.5/0.5 mg DuoNeb nebulizer treatment and had improvement in lung function and felt somewhat better.  No results found for this or any previous visit (from the past 72 hour(s)). Dg Chest 2 View  Result Date: 07/15/2016 CLINICAL DATA:  Cough, chest congestion, and fever for the past 5 days. Current smoker. EXAM: CHEST  2 VIEW COMPARISON:  Chest x-ray of February 04, 2011 FINDINGS: The lungs are well-expanded. There is patchy increased density in the right  middle lobe and at the left lung base. The heart and pulmonary vascularity are normal. The mediastinum is normal in width. There is no pleural effusion. The bony thorax exhibits no acute abnormality. IMPRESSION: Atelectasis or early pneumonia in the right middle lobe medially. Linear subsegmental atelectasis at the left lung base. Followup PA and lateral chest X-ray is recommended in 3-4 weeks following trial of antibiotic therapy to ensure resolution and exclude underlying malignancy. Electronically Signed   By: David  Martinique M.D.   On: 07/15/2016 15:27      Assessment and Plan: 51 y.o. female with  Influenza-like Illness or pneumonia.  Patient has been ill for some time and may have developed pneumonia. She is outside of treatment window for Tamiflu.  The chest x-ray results listed above are not back at the time of discharge. I've contacted the patient was to message and will send a course of Levaquin. I think it is very probable she does have influenza and the chest x-ray findings are atelectasis.    Orders Placed This Encounter  Procedures  . DG Chest 2 View    Order Specific Question:   Reason for exam:    Answer:   Cough, assess intra-thoracic pathology    Order Specific Question:   Is the patient pregnant?    Answer:   No    Order Specific Question:   Preferred imaging location?    Answer:   [facility]    Discussed warning signs or symptoms. Please see discharge instructions. Patient expresses understanding.

## 2016-08-11 DIAGNOSIS — R109 Unspecified abdominal pain: Secondary | ICD-10-CM | POA: Diagnosis not present

## 2016-08-11 DIAGNOSIS — Z79891 Long term (current) use of opiate analgesic: Secondary | ICD-10-CM | POA: Diagnosis not present

## 2016-08-11 DIAGNOSIS — N3010 Interstitial cystitis (chronic) without hematuria: Secondary | ICD-10-CM | POA: Diagnosis not present

## 2016-08-11 DIAGNOSIS — M5136 Other intervertebral disc degeneration, lumbar region: Secondary | ICD-10-CM | POA: Diagnosis not present

## 2016-08-11 DIAGNOSIS — G8929 Other chronic pain: Secondary | ICD-10-CM | POA: Diagnosis not present

## 2016-09-14 ENCOUNTER — Other Ambulatory Visit: Payer: Self-pay | Admitting: Physician Assistant

## 2016-09-14 ENCOUNTER — Encounter: Payer: Self-pay | Admitting: Family Medicine

## 2016-09-14 ENCOUNTER — Ambulatory Visit (INDEPENDENT_AMBULATORY_CARE_PROVIDER_SITE_OTHER): Payer: Medicare HMO | Admitting: Family Medicine

## 2016-09-14 VITALS — BP 138/78 | HR 89 | Temp 98.6°F | Wt 182.0 lb

## 2016-09-14 DIAGNOSIS — N644 Mastodynia: Secondary | ICD-10-CM

## 2016-09-14 DIAGNOSIS — R300 Dysuria: Secondary | ICD-10-CM

## 2016-09-14 DIAGNOSIS — M79645 Pain in left finger(s): Secondary | ICD-10-CM | POA: Diagnosis not present

## 2016-09-14 LAB — POCT URINALYSIS DIPSTICK
BILIRUBIN UA: NEGATIVE
GLUCOSE UA: NEGATIVE
Ketones, UA: NEGATIVE
LEUKOCYTES UA: NEGATIVE
Nitrite, UA: NEGATIVE
PROTEIN UA: NEGATIVE
SPEC GRAV UA: 1.02 (ref 1.030–1.035)
Urobilinogen, UA: 0.2 (ref ?–2.0)
pH, UA: 6.5 (ref 5.0–8.0)

## 2016-09-14 NOTE — Patient Instructions (Signed)
Thank you for coming in today. You should hear from the breast center soon about the diagnostic mammogram and ultrasound.   For your finger please use voltaren gel 4x daily.   Recheck as needed.   For UTI symptoms we are doing a culture.

## 2016-09-14 NOTE — Progress Notes (Signed)
[name] is a 51 y.o. female who presents to [place]: [place] today for right breast pain, left index finger pain, dysuria.  Right breast pain: Patient notes a 6 day history of right breast pain without swelling or masses skin change or nipple discharge. She denies any fevers or chills and feels well otherwise. She's tried ibuprofen which helps a little.  Left index finger pain: Patient has miles pain and swelling of the left index finger especially at the dorsal proximal phalanx. She denies any radiating pain weakness or numbness fevers or chills. She denies any injury. She has not tried much treatment yet.  Dysuria: Patient notes continued dysuria. She has a history of interstitial cystitis and requests a urinalysis today. Symptoms have not changed much.   Past Medical History:  Diagnosis Date  . Anxiety   . Cancer (HCC)    cervical  . Colitis    lymphacitic  . Depression   . Dysrhythmia    pvc   . Gastric ulcer   . Headache(784.0)   . Heart murmur   . IBS (irritable bowel syndrome)   . Interstitial cystitis   . MVP (mitral valve prolapse)   . Urinary calculus or stone    Past Surgical History:  Procedure Laterality Date  . APPENDECTOMY  2005  . BILATERAL OOPHORECTOMY  2012  . DILATION AND CURETTAGE OF UTERUS     x 4  one after each SAB  . EXPLORATORY LAPAROTOMY  2007  . LAPAROSCOPY N/A 08/04/2012   Procedure: LAPAROSCOPY DIAGNOSTIC;  Surgeon: Gayland Curry, MD,FACS;  Location: [facility];  Service: General;  Laterality: N/A;  . PARTIAL HYSTERECTOMY  2005  . TONSILLECTOMY  30 years ago  . TUBAL LIGATION  2000   Social History  Substance Use Topics  . Smoking status: Current Every Day Smoker    Packs/day: 0.50    Years: 25.00    Types: Cigarettes  . Smokeless tobacco: Never Used  . Alcohol use 0.0 oz/week     Comment: rarely   family history  includes Cancer in her father and paternal uncle; Cirrhosis in her father and mother; Heart attack in her mother.  ROS as above:  Medications: Current Outpatient Prescriptions  Medication Sig Dispense Refill  . acetic acid-hydrocortisone (VOSOL-HC) otic solution Place 4 drops into the left ear 3 (three) times daily. 10 mL 0  . Alum & Mag Hydroxide-Simeth (GI COCKTAIL) SUSP suspension Take 30 mLs by mouth 2 (two) times daily. Shake well.    . atenolol (TENORMIN) 50 MG tablet TAKE ONE-HALF TABLET BY MOUTH ONCE DAILY 30 tablet 1  . benzonatate (TESSALON) 200 MG capsule Take 1 capsule (200 mg total) by mouth 3 (three) times daily as needed for cough. 45 capsule 0  . bupivacaine (MARCAINE) 0.5 % SOLN injection Instill 15 cc into bladder daily    . DULoxetine (CYMBALTA) 30 MG capsule Take 2 capsules (60 mg total) by mouth daily. 60 capsule 0  . heparin 10000 UNIT/ML injection USE AS DIRECTED    . hydrOXYzine (ATARAX/VISTARIL) 25 MG tablet Take 25 mg by mouth 3 (three) times daily as needed.    [name] levofloxacin (LEVAQUIN) 500 MG tablet Take 1 tablet (500 mg total) by mouth daily. 7 tablet 0  . lidocaine (LIDODERM) 5 % Place 1 patch onto the skin daily as needed (For pain.).     [name] meloxicam (MOBIC) 15 MG tablet One tab PO qAM with breakfast  for 2 weeks, then daily prn pain. 30 tablet 3  . morphine (MS CONTIN) 60 MG 12 hr tablet Take 60 mg by mouth.    . naloxegol oxalate (MOVANTIK) 25 MG TABS tablet Take 1 tablet (25 mg total) by mouth daily. 30 tablet 5  . nitrofurantoin (MACRODANTIN) 50 MG capsule TAKE 1 CAPSULE NIGHTLY 90 capsule 3  . pantoprazole (PROTONIX) 40 MG tablet Take 40 mg by mouth daily.    . pentosan polysulfate (ELMIRON) 100 MG capsule Take 100 mg by mouth 2 (two) times daily. Take two tablets by mouth twice a day    . perphenazine (TRILAFON) 2 MG tablet Take 2 mg by mouth 2 (two) times daily.    . phenazopyridine (PYRIDIUM) 200 MG tablet Take 1 tablet (200 mg total) by mouth 3 (three)  times daily as needed for pain. 10 tablet 0  . pramipexole (MIRAPEX) 0.125 MG tablet TAKE 1 TABLET AT BEDTIME AS NEEDED FOR RESTLESS LEGS 90 tablet 3  . promethazine (PHENERGAN) 25 MG tablet TAKE ONE-HALF (1/2) TABLET EVERY 6 HOURS AS NEEDED FOR NAUSEA OR VOMITING 90 tablet 0   No current facility-administered medications for this visit.    Allergies  Allergen Reactions  . Gabapentin Anaphylaxis    Sleepiness Sleepiness   . Lyrica [Pregabalin] Anaphylaxis    Made symptoms worse.  Made symptoms worse.   . Ambien [Zolpidem] Other (See Comments)    Insomnia Pt did not state her reaction   . Amitriptyline Other (See Comments)    Pt did not state her reaction    Health Maintenance Health Maintenance  Topic Date Due  . HIV Screening  11/24/1979  . PAP SMEAR  01/09/2016  . INFLUENZA VACCINE  01/12/2017  . MAMMOGRAM  02/26/2017  . TETANUS/TDAP  09/07/2021  . COLONOSCOPY  04/02/2025     Exam:  BP 138/78   Pulse 89   Temp 98.6 F (37 C) (Oral)   Wt 182 lb (82.6 kg)   BMI 29.38 kg/m   Gen: Well NAD HEENT: EOMI,  MMM Lungs: Normal work of breathing. CTABL Heart: RRR no MRG Abd: NABS, Soft. Nondistended, Nontender Exts: Brisk capillary refill, warm and well perfused.  Left index finger slightly swollen without erythema dorsal proximal phalanx minimally tender. Normal motion. Breasts bilaterally normal-appearing without masses. Right breast is mildly tender to palpation especially into the axilla.  Point-of-care urinalysis Significant only for moderate blood. Culture pending  Limited musculoskeletal skeletal ultrasound of the right finger shows no significant abnormalities normal bony structures. No tenosynovitis or tendon ruptures.  No results found for this or any previous visit (from the past 72 hour(s)). No results found.    Assessment and Plan: 51 y.o. female with  Breast pain: Unclear etiology. Plan for diagnostic mammogram and ultrasound  Finger pain: Unclear  etiology. Plan for topical diclofenac gel and watchful waiting.  Dysuria likely interstitial cystitis. Urine culture pending.   Orders Placed This Encounter  Procedures  . Urine culture  . US BREAST LTD UNI RIGHT INC AXILLA    Standing Status:   Future    Standing Expiration Date:   11/14/2017    Order Specific Question:   Reason for Exam (SYMPTOM  OR DIAGNOSIS REQUIRED)    Answer:   eval right breast pain    Order Specific Question:   Preferred imaging location?    Answer:   [facility]  . MM DIAG BREAST TOMO BILATERAL    Standing Status:   Future    Standing  Expiration Date:   11/14/2017    Order Specific Question:   Reason for Exam (SYMPTOM  OR DIAGNOSIS REQUIRED)    Answer:   eval right breast pain    Order Specific Question:   Is the patient pregnant?    Answer:   No    Order Specific Question:   Preferred imaging location?    Answer:   [facility]   No orders of the defined types were placed in this encounter.    Discussed warning signs or symptoms. Please see discharge instructions. Patient expresses understanding.

## 2016-09-16 DIAGNOSIS — R102 Pelvic and perineal pain: Secondary | ICD-10-CM | POA: Diagnosis not present

## 2016-09-16 DIAGNOSIS — R1030 Lower abdominal pain, unspecified: Secondary | ICD-10-CM | POA: Diagnosis not present

## 2016-09-16 DIAGNOSIS — N3010 Interstitial cystitis (chronic) without hematuria: Secondary | ICD-10-CM | POA: Diagnosis not present

## 2016-09-16 LAB — URINE CULTURE: ORGANISM ID, BACTERIA: NO GROWTH

## 2016-09-28 ENCOUNTER — Ambulatory Visit
Admission: RE | Admit: 2016-09-28 | Discharge: 2016-09-28 | Disposition: A | Discharge status: Home / Self Care | Payer: Medicare HMO | Source: Ambulatory Visit | Attending: Family Medicine | Admitting: Family Medicine

## 2016-09-28 DIAGNOSIS — N644 Mastodynia: Secondary | ICD-10-CM

## 2016-09-28 DIAGNOSIS — R922 Inconclusive mammogram: Secondary | ICD-10-CM | POA: Diagnosis not present

## 2016-09-28 DIAGNOSIS — N6489 Other specified disorders of breast: Secondary | ICD-10-CM | POA: Diagnosis not present

## 2016-10-20 ENCOUNTER — Other Ambulatory Visit: Payer: Self-pay | Admitting: Physician Assistant

## 2016-11-09 DIAGNOSIS — R109 Unspecified abdominal pain: Secondary | ICD-10-CM | POA: Diagnosis not present

## 2016-11-09 DIAGNOSIS — Z79891 Long term (current) use of opiate analgesic: Secondary | ICD-10-CM | POA: Diagnosis not present

## 2016-11-09 DIAGNOSIS — G8929 Other chronic pain: Secondary | ICD-10-CM | POA: Diagnosis not present

## 2016-11-09 DIAGNOSIS — N3010 Interstitial cystitis (chronic) without hematuria: Secondary | ICD-10-CM | POA: Diagnosis not present

## 2016-11-17 ENCOUNTER — Ambulatory Visit (INDEPENDENT_AMBULATORY_CARE_PROVIDER_SITE_OTHER): Payer: Medicare HMO | Admitting: Sports Medicine

## 2016-11-17 ENCOUNTER — Ambulatory Visit (INDEPENDENT_AMBULATORY_CARE_PROVIDER_SITE_OTHER): Payer: Medicare HMO

## 2016-11-17 DIAGNOSIS — M1711 Unilateral primary osteoarthritis, right knee: Secondary | ICD-10-CM

## 2016-11-17 DIAGNOSIS — M25561 Pain in right knee: Secondary | ICD-10-CM

## 2016-11-17 NOTE — Assessment & Plan Note (Signed)
Aspiration and injection, x-rays, physical therapy. History of peptic ulcer disease, avoiding NSAIDs. Return in one month.

## 2016-11-17 NOTE — Progress Notes (Signed)
   Subjective:    I'm seeing this patient as a consultation for:  Shelly Planas, PA-C  CC: Right knee pain  HPI: This is a 51 year old female, she comes in with a several month history of increasing pain in her right knee, medial joint line with swelling and tightness, gelling. No mechanical symptoms, no trauma. No radiation.  Past medical history:  Negative.  See flowsheet/record as well for more information.  Surgical history: Negative.  See flowsheet/record as well for more information.  Family history: Negative.  See flowsheet/record as well for more information.  Social history: Negative.  See flowsheet/record as well for more information.  Allergies, and medications have been entered into the medical record, reviewed, and no changes needed.   Review of Systems: No headache, visual changes, nausea, vomiting, diarrhea, constipation, dizziness, abdominal pain, skin rash, fevers, chills, night sweats, weight loss, swollen lymph nodes, body aches, joint swelling, muscle aches, chest pain, shortness of breath, mood changes, visual or auditory hallucinations.   Objective:   General: Well Developed, well nourished, and in no acute distress.  Neuro/Psych: Alert and oriented x3, extra-ocular muscles intact, able to move all 4 extremities, sensation grossly intact. Skin: Warm and dry, no rashes noted.  Respiratory: Not using accessory muscles, speaking in full sentences, trachea midline.  Cardiovascular: Pulses palpable, no extremity edema. Abdomen: Does not appear distended. Right Knee: Visibly swollen with a palpable fluid wave, medial joint line pain. ROM normal in flexion and extension and lower leg rotation. Ligaments with solid consistent endpoints including ACL, PCL, LCL, MCL. Negative Mcmurray's and provocative meniscal tests. Non painful patellar compression. Patellar and quadriceps tendons unremarkable. Hamstring and quadriceps strength is normal.  Procedure: Real-time  Ultrasound Guided aspiration/Injection of right knee Device: GE Logiq E  Verbal informed consent obtained.  Time-out conducted.  Noted no overlying erythema, induration, or other signs of local infection.  Skin prepped in a sterile fashion.  Local anesthesia: Topical Ethyl chloride.  With sterile technique and under real time ultrasound guidance:  20 mL straw-colored fluid aspirated, syringe switched and 1 mL Kenalog 40, 2 mL lidocaine, 2 mL bupivacaine injected easily. Completed without difficulty  Pain immediately resolved suggesting accurate placement of the medication.  Advised to call if fevers/chills, erythema, induration, drainage, or persistent bleeding.  Images permanently stored and available for review in the ultrasound unit.  Impression: Technically successful ultrasound guided injection.  Impression and Recommendations:   This case required medical decision making of moderate complexity.  Primary osteoarthritis of right knee Aspiration and injection, x-rays, physical therapy. History of peptic ulcer disease, avoiding NSAIDs. Return in one month.

## 2016-11-19 ENCOUNTER — Ambulatory Visit (INDEPENDENT_AMBULATORY_CARE_PROVIDER_SITE_OTHER): Payer: Medicare HMO | Admitting: Physician Assistant

## 2016-11-19 ENCOUNTER — Encounter: Payer: Self-pay | Admitting: Physician Assistant

## 2016-11-19 VITALS — BP 107/66 | HR 70 | Ht 66.0 in | Wt 185.0 lb

## 2016-11-19 DIAGNOSIS — E663 Overweight: Secondary | ICD-10-CM

## 2016-11-19 DIAGNOSIS — R21 Rash and other nonspecific skin eruption: Secondary | ICD-10-CM

## 2016-11-19 MED ORDER — PHENTERMINE HCL 37.5 MG PO TABS: 37.5000 mg | ORAL_TABLET | Freq: Every day | ORAL | 0 refills | Status: DC

## 2016-11-19 MED ORDER — CLOTRIMAZOLE-BETAMETHASONE 1-0.05 % EX CREA: 1.0000 "application " | TOPICAL_CREAM | Freq: Two times a day (BID) | CUTANEOUS | 1 refills | Status: DC

## 2016-11-19 NOTE — Patient Instructions (Signed)
1 month nurse visit

## 2016-11-21 ENCOUNTER — Encounter: Payer: Self-pay | Admitting: Physician Assistant

## 2016-11-21 NOTE — Progress Notes (Signed)
Subjective:    Patient ID: [name], female    DOB: 04/05/1966, 51 y.o.   MRN: [id number]  HPI Pt is a 52 yo female who presents to the clinic after sports medicine doctor encouraged her to talk to her PCP about weight. She is disabled due to intersitial cystitis and the pain. She is not able to exercise due to pain. She does feels like she eats healthy most of the time. She slowly gains weight. She is having some knee pain and thinks weight loss will help her knee pain. She has never tried medications.   She also has a itchy rash in her right axilla. She has noticed it off and on for a few weeks. Sometimes it gets worse an almost has a ring around her axilla. Does not hurt. She has a central bump that does not go away. She has tried lotions and some OTC anti itch creams with little benefit. She has not changed deodorant.   .. Active Ambulatory Problems    Diagnosis Date Noted  . TOBACCO ABUSE 05/28/2009  . MITRAL VALVE PROLAPSE 05/28/2009  . UNSPECIFIED CARDIAC DYSRHYTHMIA 05/16/2009  . TMJ PAIN 07/26/2008  . GASTRIC ULCER 05/26/2009  . NEPHROLITHIASIS 03/06/2010  . HEMATURIA UNSPECIFIED 12/19/2008  . OVARIAN CYST 03/06/2010  . PALPITATIONS 05/16/2009  . HEART MURMUR, HX OF 05/26/2009  . Depressive disorder, not elsewhere classified 11/06/2010  . Lymphocytic colitis 12/01/2011  . EBV infection 04/21/2012  . Chronic pain syndrome 06/21/2012  . Fibromyalgia 06/21/2012  . Carpal tunnel syndrome, bilateral 06/21/2012  . Degenerative disc disease, cervical 06/21/2012  . Facet arthropathy, cervical ([place]) 06/21/2012  . DDD (degenerative disc disease), lumbosacral 06/21/2012  . Lower abdominal pain 07/26/2012  . Interstitial cystitis 04/13/2013  . Primary osteoarthritis of right knee 12/20/2013  . Incontinence in female 06/26/2014  . Chronic bilateral lower abdominal pain 06/26/2014  . Pain in joint, ankle and foot 04/21/2015  . Status post implantation of urinary electronic  stimulator device 05/05/2015  . Post-menopausal 06/20/2015  . Family history of cardiovascular disease 06/20/2015  . Constipation 06/20/2015  . No energy 06/20/2015  . Dysuria 06/20/2015  . Hiatal hernia 07/16/2015  . Depression 09/03/2015  . Primary osteoarthritis of left hand 02/27/2016  . Breast pain, right 09/14/2016   Resolved Ambulatory Problems    Diagnosis Date Noted  . Other change of lacrimal passages 03/01/2010  . URI 08/17/2008  . STONE, URINARY CALCULUS,UNSPEC. 12/29/2008  . FOOT PAIN, LEFT 04/14/2010  . Diarrhea 11/04/2009  . CONTUSION, LEFT HAND 05/10/2009  . CHEMICAL BURN 04/14/2010  . Nephrolithiasis 11/06/2010  . Bacterial folliculitis 32/35/5732  . Acute pharyngitis 01/30/2011  . BRONCHITIS, ACUTE 02/04/2011  . ABDOMINAL PAIN, ACUTE 02/04/2011   Past Medical History:  Diagnosis Date  . Anxiety   . Cancer ([place])   . Colitis   . Depression   . Dysrhythmia   . Gastric ulcer   . Headache(784.0)   . Heart murmur   . IBS (irritable bowel syndrome)   . Interstitial cystitis   . MVP (mitral valve prolapse)   . Urinary calculus or stone       Review of Systems  All other systems reviewed and are negative.      Objective:   Physical Exam  Constitutional: She is oriented to person, place, and time. She appears well-developed and well-nourished.  Adiposity in the central abdomen.   HENT:  Head: Normocephalic and atraumatic.  Cardiovascular: Normal rate, regular rhythm and normal heart sounds.  Pulmonary/Chest: Effort normal and breath sounds normal. She has no wheezes.  Neurological: She is alert and oriented to person, place, and time.  Skin:     Psychiatric: She has a normal mood and affect. Her behavior is normal.          Assessment & Plan:  [name] KitchenMarland KitchenLinsey was seen today for obesity and rash.  Diagnoses and all orders for this visit:  Rash and nonspecific skin eruption -     clotrimazole-betamethasone (LOTRISONE) cream; Apply 1  application topically 2 (two) times daily.  Overweight (BMI 25.0-29.9) -     phentermine (ADIPEX-P) 37.5 MG tablet; Take 1 tablet (37.5 mg total) by mouth daily before breakfast.   Cream given for rash. Likely contact dermatitis but due to patients report of ring and location of rash will treat with antifungal as well.   Discussed weight. Encouraged 1500 calorie diet and exercise. Pt requested phentermine. Discussed side effects. Start in am. Follow up nurse visit in one month.

## 2016-11-22 ENCOUNTER — Ambulatory Visit (INDEPENDENT_AMBULATORY_CARE_PROVIDER_SITE_OTHER): Payer: Medicare HMO | Admitting: Physical Therapy

## 2016-11-22 ENCOUNTER — Encounter: Payer: Self-pay | Admitting: Physical Therapy

## 2016-11-22 DIAGNOSIS — M25651 Stiffness of right hip, not elsewhere classified: Secondary | ICD-10-CM

## 2016-11-22 DIAGNOSIS — M25661 Stiffness of right knee, not elsewhere classified: Secondary | ICD-10-CM | POA: Diagnosis not present

## 2016-11-22 DIAGNOSIS — M25561 Pain in right knee: Secondary | ICD-10-CM | POA: Diagnosis not present

## 2016-11-22 DIAGNOSIS — M6281 Muscle weakness (generalized): Secondary | ICD-10-CM | POA: Diagnosis not present

## 2016-11-22 DIAGNOSIS — G8929 Other chronic pain: Secondary | ICD-10-CM | POA: Diagnosis not present

## 2016-11-22 NOTE — Therapy (Addendum)
[facility] [address] [place], [place], [address] Phone: [phone]   Fax:  [phone]  Physical Therapy Evaluation  Patient Details  Name: [name] MRN: [id number] Date of Birth: 1966-04-26 Referring Provider: Dr Dianah Field  Encounter Date: 11/22/2016      PT End of Session - 11/22/16 0846    Visit Number 1   Number of Visits 8   Date for PT Re-Evaluation 12/20/16   PT Start Time 0846   PT Stop Time 0936   PT Time Calculation (min) 50 min   Activity Tolerance Patient tolerated treatment well      Past Medical History:  Diagnosis Date  . Anxiety   . Cancer (H[facility])    cervical  . Colitis    lymphacitic  . Depression   . Dysrhythmia    pvc   . Gastric ulcer   . Headache(784.0)   . Heart murmur   . IBS (irritable bowel syndrome)   . Interstitial cystitis   . MVP (mitral valve prolapse)   . Urinary calculus or stone     Past Surgical History:  Procedure Laterality Date  . APPENDECTOMY  2005  . BILATERAL OOPHORECTOMY  2012  . DILATION AND CURETTAGE OF UTERUS     x 4  one after each SAB  . EXPLORATORY LAPAROTOMY  2007  . LAPAROSCOPY N/A 08/04/2012   Procedure: LAPAROSCOPY DIAGNOSTIC;  Surgeon: Gayland Curry, MD,FACS;  Location: [facility];  Service: General;  Laterality: N/A;  . PARTIAL HYSTERECTOMY  2005  . TONSILLECTOMY  30 years ago  . TUBAL LIGATION  2000    There were no vitals filed for this visit.       Subjective Assessment - 11/22/16 0846    Subjective Pt reports she developed Rt knee pain months ago. She has arthritis in her ankles and tried some ointment on her knees without relief.  Saw MD last week, had fluid drawn off and cortisone injection. hadd 99% improvement since then.  Not doing formal exercise.    Pertinent History bladder dx - constant abdominal pain - limits her exercise.  " collasped discs lumbar, L2-3"    How long can you sit comfortably? before injection had  throbbing in her Rt knee.    How long can you walk comfortably? no limitations now, before injection had pain walking in the house and taking one step at a time.    Diagnostic tests x-rays - arthritis   Patient Stated Goals get rid of pain in her knees.    Currently in Pain? No/denies            [facility] PT Assessment - 11/22/16 0001      Assessment   Medical Diagnosis Rt knee OA   Referring Provider Dr Dianah Field   Onset Date/Surgical Date 05/24/16   Next MD Visit 12/21/16   Prior Therapy none     Precautions   Precautions None   Precaution Comments unable to tolerate due to abdominal issues     Balance Screen   Has the patient fallen in the past 6 months No     [place] residence   Living Arrangements Spouse/significant other   Home Layout Two level  stairs ok since injection     Prior Function   Level of Independence Independent   Vocation On disability  because of bladder dx   Leisure work in the yard, beach walking     Observation/Other Assessments  Focus on Therapeutic Outcomes (FOTO)  55% limited     Functional Tests   Functional tests Squat;Single leg stance;Step down  Rt knee press uses IR      Squat   Comments tender Rt knee bilat LE's abduction and ER      Step Down   Comments fair eccentric control Rt quad, with discomfort     Single Leg Stance   Comments bilat WNL      Posture/Postural Control   Posture/Postural Control Postural limitations   Postural Limitations Increased lumbar lordosis     ROM / Strength   AROM / PROM / Strength AROM;Strength     AROM   Overall AROM Comments lumbar WNL   AROM Assessment Site Knee   Right/Left Knee --  bilat flex 135, -3 degrees extension      Strength   Overall Strength Comments discomfort with Rt quad sets   Strength Assessment Site Hip;Knee;Ankle   Right/Left Hip --  bialt WNL except abduction 4/5   Right/Left Knee --  bilat WNL with break test    Right/Left Ankle --  bilat WNL     Flexibility   Soft Tissue Assessment /Muscle Length --  tight hip flexors and tight posterior knees.      Palpation   Palpation comment tender around Rt patella            Objective measurements completed on examination: See above findings.          [facility] PT Treatment/Exercise - 11/22/16 0001      Exercises   Exercises Knee/Hip     Knee/Hip Exercises: Stretches   Active Hamstring Stretch Both  30 reps knee presses with strap   Quad Stretch Both;1 rep  45 sec in high kneel and in chair     Knee/Hip Exercises: Standing   SLS 2x8 reps with FWD leans in safe ROM, each side.      Knee/Hip Exercises: Sidelying   Hip ABduction Strengthening;Both  2x8, VC for form                PT Education - 11/22/16 0929    Education provided Yes   Education Details HEP   Person(s) Educated Patient   Methods Explanation;Demonstration;Handout   Comprehension Returned demonstration;Verbalized understanding             PT Long Term Goals - 11/22/16 0944      PT LONG TERM GOAL #1   Title I with advanced HEP ( 12/20/16)    Time 4   Period Weeks   Status New     PT LONG TERM GOAL #2   Title perform hip flexor stretching with minimal to no discomfort ( 12/20/16)    Time 4   Period Weeks   Status New     PT LONG TERM GOAL #3   Title increase strength bilat hip abduction =/> 5-/5 ( 12/20/16)    Time 4   Period Weeks   Status New     PT LONG TERM GOAL #4   Title improve FOTO =/< 42% limited ( 12/20/16)    Time 4   Period Weeks   Status New     PT LONG TERM GOAL #5   Title perform gardening/yard work with no more than 1/10 Rt knee pain ( 12/20/16)    Time 4   Period Weeks   Status New                Plan - 11/22/16  1941    Clinical Impression Statement 51 yo female presents for PT eval for Rt knee OA.  She had an aspiration and cortisone injection last week, they have helped alot. She currently has higher level  weakness in bilat LE's, pain in Rt knee with some weightbearing functional movements, slight decreased in knee extension and LE tightness.  Rehab/exercise will be limited as [place] has abdominal issues that prevent her from lying prone and limite her exercise tolerance.    History and Personal Factors relevant to plan of care: back disc issues, abdominal issues.    Clinical Presentation Evolving   Clinical Decision Making Moderate   Rehab Potential Good   PT Frequency 2x / week   PT Duration 4 weeks   PT Treatment/Interventions Moist Heat;Ultrasound;Therapeutic exercise;Dry needling;Taping;Manual techniques;Neuromuscular re-education;Cryotherapy;Electrical Stimulation;Gait training;Patient/family education   PT Next Visit Plan progress proprioception   Consulted and Agree with Plan of Care Patient      Patient will benefit from skilled therapeutic intervention in order to improve the following deficits and impairments:  Decreased range of motion, Pain, Decreased strength, Decreased activity tolerance  Visit Diagnosis: Chronic pain of right knee - Plan: PT plan of care cert/re-cert  Muscle weakness (generalized) - Plan: PT plan of care cert/re-cert  Stiffness of right hip, not elsewhere classified - Plan: PT plan of care cert/re-cert  Stiffness of right knee, not elsewhere classified - Plan: PT plan of care cert/re-cert     Problem List Patient Active Problem List   Diagnosis Date Noted  . Breast pain, right 09/14/2016  . Primary osteoarthritis of left hand 02/27/2016  . Depression 09/03/2015  . Hiatal hernia 07/16/2015  . Post-menopausal 06/20/2015  . Family history of cardiovascular disease 06/20/2015  . Constipation 06/20/2015  . No energy 06/20/2015  . Dysuria 06/20/2015  . Status post implantation of urinary electronic stimulator device 05/05/2015  . Pain in joint, ankle and foot 04/21/2015  . Incontinence in female 06/26/2014  . Chronic bilateral lower abdominal pain  06/26/2014  . Primary osteoarthritis of right knee 12/20/2013  . Interstitial cystitis 04/13/2013  . Lower abdominal pain 07/26/2012  . Chronic pain syndrome 06/21/2012  . Fibromyalgia 06/21/2012  . Carpal tunnel syndrome, bilateral 06/21/2012  . Degenerative disc disease, cervical 06/21/2012  . Facet arthropathy, cervical ([place]) 06/21/2012  . DDD (degenerative disc disease), lumbosacral 06/21/2012  . EBV infection 04/21/2012  . Lymphocytic colitis 12/01/2011  . Depressive disorder, not elsewhere classified 11/06/2010  . NEPHROLITHIASIS 03/06/2010  . OVARIAN CYST 03/06/2010  . TOBACCO ABUSE 05/28/2009  . MITRAL VALVE PROLAPSE 05/28/2009  . GASTRIC ULCER 05/26/2009  . HEART MURMUR, HX OF 05/26/2009  . UNSPECIFIED CARDIAC DYSRHYTHMIA 05/16/2009  . PALPITATIONS 05/16/2009  . HEMATURIA UNSPECIFIED 12/19/2008  . TMJ PAIN 07/26/2008    Jeral Pinch PT  11/22/2016, 9:47 AM  [facility] [place] [address] [place], [place], [address] Phone: [phone]   Fax:  [phone]  Name: [name] MRN: [id number] Date of Birth: 06-14-1966     PHYSICAL THERAPY DISCHARGE SUMMARY  Visits from Start of Care: 1  Current functional level related to goals / functional outcomes: unknown   Remaining deficits: unknown   Education / Equipment: Initial HEP Plan: Patient agrees to discharge.  Patient goals were not met. Patient is being discharged due to financial reasons.  ?????Pt reports her husband lost his job and they can not afford PT at this time.    Jeral Pinch, PT 01/17/17 8:25 AM

## 2016-11-22 NOTE — Patient Instructions (Addendum)
Strengthening: Hip Abduction (Side-Lying) - dont let the leg drift forward, keep belly tight    Tighten muscles on front of left thigh, then lift leg _10-14___ inches from surface, keeping knee locked.  Repeat __8-15__ times per set. Do _3___ sets per session. Do __1__ sessions per day. Repeat on the other leg.   Balance: Unilateral - [employer] - do next to a counter for safety    Stand on left foot, hands on hips. Keeping hips level, bend forward as if to touch forehead to wall. Hold ___1_ seconds. Relax. Repeat _8-15___ times per set. Do __3__ sets per session. Do __1__ sessions per day. Repeat on each leg.   Supine: Leg Stretch With Strap (Advanced) - repeat on each side.     Lie on back with one knee bent, foot flat on floor. Hook strap around other foot. Straighten knee. Raise leg to maximal stretch and straighten knee further by tightening quadriceps muscle. Hold _1-2__ seconds. Warning: Intense stretch. Stay within tolerance. Then release slightly and repress knee straight. Repeat 30___ times per session. Do __1_ sessions per day.  Quads / HF, Lunge    Kneel in deep lunge, behind leg on floor. Push pelvis down slowly while slightly arching back until stretch is felt on front of hip. Hold _45__ seconds. Repeat _1__ times per session. Do _1__ sessions per day. Repeat on the other side   .pregn

## 2016-11-24 ENCOUNTER — Other Ambulatory Visit: Payer: Self-pay | Admitting: Physician Assistant

## 2016-11-25 ENCOUNTER — Encounter: Payer: Medicare HMO | Admitting: Physical Therapy

## 2016-12-20 ENCOUNTER — Ambulatory Visit: Payer: Medicare HMO | Admitting: Sports Medicine

## 2016-12-28 ENCOUNTER — Other Ambulatory Visit: Payer: Self-pay | Admitting: Physician Assistant

## 2016-12-28 ENCOUNTER — Ambulatory Visit (INDEPENDENT_AMBULATORY_CARE_PROVIDER_SITE_OTHER): Payer: Medicare HMO | Admitting: Family Medicine

## 2016-12-28 DIAGNOSIS — E663 Overweight: Secondary | ICD-10-CM

## 2016-12-28 MED ORDER — PHENTERMINE HCL 37.5 MG PO TABS: 37.5000 mg | ORAL_TABLET | Freq: Every day | ORAL | 0 refills | Status: DC

## 2016-12-28 NOTE — Progress Notes (Signed)
Pt in today for weight/bp check.  She has lost 3lbs.  Denies side effects.  Phentermine refilled for one month.

## 2017-01-05 DIAGNOSIS — N3010 Interstitial cystitis (chronic) without hematuria: Secondary | ICD-10-CM | POA: Diagnosis not present

## 2017-01-05 DIAGNOSIS — Z79899 Other long term (current) drug therapy: Secondary | ICD-10-CM | POA: Diagnosis not present

## 2017-01-05 DIAGNOSIS — Z87891 Personal history of nicotine dependence: Secondary | ICD-10-CM | POA: Diagnosis not present

## 2017-01-05 DIAGNOSIS — R0602 Shortness of breath: Secondary | ICD-10-CM | POA: Diagnosis not present

## 2017-01-05 DIAGNOSIS — R69 Illness, unspecified: Secondary | ICD-10-CM | POA: Diagnosis not present

## 2017-01-05 DIAGNOSIS — M545 Low back pain: Secondary | ICD-10-CM | POA: Diagnosis not present

## 2017-01-10 DIAGNOSIS — N329 Bladder disorder, unspecified: Secondary | ICD-10-CM | POA: Diagnosis not present

## 2017-01-10 DIAGNOSIS — N3010 Interstitial cystitis (chronic) without hematuria: Secondary | ICD-10-CM | POA: Diagnosis not present

## 2017-01-10 DIAGNOSIS — Z79899 Other long term (current) drug therapy: Secondary | ICD-10-CM | POA: Diagnosis not present

## 2017-01-12 ENCOUNTER — Ambulatory Visit (INDEPENDENT_AMBULATORY_CARE_PROVIDER_SITE_OTHER): Payer: Medicare HMO | Admitting: Physician Assistant

## 2017-01-12 ENCOUNTER — Encounter: Payer: Self-pay | Admitting: Physician Assistant

## 2017-01-12 VITALS — BP 107/66 | HR 65 | Temp 97.6°F | Ht 66.0 in | Wt 177.0 lb

## 2017-01-12 DIAGNOSIS — Z8719 Personal history of other diseases of the digestive system: Secondary | ICD-10-CM

## 2017-01-12 DIAGNOSIS — N3011 Interstitial cystitis (chronic) with hematuria: Secondary | ICD-10-CM

## 2017-01-12 DIAGNOSIS — K449 Diaphragmatic hernia without obstruction or gangrene: Secondary | ICD-10-CM

## 2017-01-12 DIAGNOSIS — R300 Dysuria: Secondary | ICD-10-CM

## 2017-01-12 DIAGNOSIS — Z8711 Personal history of peptic ulcer disease: Secondary | ICD-10-CM

## 2017-01-12 LAB — POCT URINALYSIS DIPSTICK
Bilirubin, UA: NEGATIVE
Glucose, UA: NEGATIVE
KETONES UA: NEGATIVE
Leukocytes, UA: NEGATIVE
Nitrite, UA: NEGATIVE
PH UA: 5.5 (ref 5.0–8.0)
PROTEIN UA: NEGATIVE
UROBILINOGEN UA: 0.2 U/dL

## 2017-01-12 MED ORDER — PANTOPRAZOLE SODIUM 40 MG PO TBEC
40.0000 mg | DELAYED_RELEASE_TABLET | Freq: Every day | ORAL | 0 refills | Status: DC
Start: 2017-01-12 — End: 2017-03-16

## 2017-01-12 MED ORDER — CEFUROXIME AXETIL 250 MG PO TABS: 250.0000 mg | ORAL_TABLET | Freq: Two times a day (BID) | ORAL | 0 refills | Status: AC

## 2017-01-12 MED ORDER — PANTOPRAZOLE SODIUM 40 MG PO TBEC: 40.0000 mg | DELAYED_RELEASE_TABLET | Freq: Every day | ORAL | 0 refills | Status: DC

## 2017-01-12 NOTE — Progress Notes (Signed)
HPI:                                                                [name] is a 51 y.o. female who presents to [facility] today for UTI symptoms  Urinary Tract Infection   This is a new problem. The current episode started 1 to 4 weeks ago. The problem occurs every urination. The problem has been unchanged. The quality of the pain is described as burning. The pain is moderate. There has been no fever. She is sexually active. There is no history of pyelonephritis. Pertinent negatives include no chills, discharge, flank pain or possible pregnancy. Associated symptoms comments: + suprapubic pressure. She has tried antibiotics (Bactrim - GI upset) for the symptoms. The treatment provided no relief. Her past medical history is significant for catheterization and recurrent UTIs. + interstitial cystitis   Patient also reports she ran out of her Protonix for history of gastric ulcer and hiatal hernia. Reports GI will not see her until October. She is requesting refills of her PPI today.  Past Medical History:  Diagnosis Date  . Anxiety   . Cancer (HCC)    cervical  . Colitis    lymphacitic  . Depression   . Dysrhythmia    pvc   . Gastric ulcer   . Headache(784.0)   . Heart murmur   . IBS (irritable bowel syndrome)   . Interstitial cystitis   . MVP (mitral valve prolapse)   . Urinary calculus or stone    Past Surgical History:  Procedure Laterality Date  . APPENDECTOMY  2005  . BILATERAL OOPHORECTOMY  2012  . DILATION AND CURETTAGE OF UTERUS     x 4  one after each SAB  . EXPLORATORY LAPAROTOMY  2007  . LAPAROSCOPY N/A 08/04/2012   Procedure: LAPAROSCOPY DIAGNOSTIC;  Surgeon: Gayland Curry, MD,FACS;  Location: [facility];  Service: General;  Laterality: N/A;  . PARTIAL HYSTERECTOMY  2005  . TONSILLECTOMY  30 years ago  . TUBAL LIGATION  2000   Social History  Substance Use Topics  . Smoking status: Current Every Day Smoker   Packs/day: 0.50    Years: 25.00    Types: Cigarettes  . Smokeless tobacco: Never Used  . Alcohol use 0.0 oz/week     Comment: rarely   family history includes Cancer in her father and paternal uncle; Cirrhosis in her father and mother; Heart attack in her mother.  ROS: negative except as noted in the HPI  Medications: Current Outpatient Prescriptions  Medication Sig Dispense Refill  . acetic acid-hydrocortisone (VOSOL-HC) otic solution Place 4 drops into the left ear 3 (three) times daily. 10 mL 0  . atenolol (TENORMIN) 50 MG tablet TAKE ONE-HALF TABLET BY MOUTH ONCE DAILY 30 tablet 1  . bupivacaine (MARCAINE) 0.5 % SOLN injection Instill 15 cc into bladder daily    . clotrimazole-betamethasone (LOTRISONE) cream Apply 1 application topically 2 (two) times daily. 60 g 1  . DULoxetine (CYMBALTA) 30 MG capsule TAKE 2 CAPSULES (60MG       TOTAL) DAILY 180 capsule 1  . heparin 10000 UNIT/ML injection USE AS DIRECTED    . hydrOXYzine (ATARAX/VISTARIL) 25 MG tablet Take 25 mg by mouth 3 (three) times  daily as needed.    . lidocaine (LIDODERM) 5 % Place 1 patch onto the skin daily as needed (For pain.).     [name] morphine (MS CONTIN) 60 MG 12 hr tablet Take 60 mg by mouth.    . naloxegol oxalate (MOVANTIK) 25 MG TABS tablet Take 1 tablet (25 mg total) by mouth daily. 30 tablet 5  . nitrofurantoin (MACRODANTIN) 50 MG capsule TAKE 1 CAPSULE NIGHTLY 90 capsule 3  . pentosan polysulfate (ELMIRON) 100 MG capsule Take 100 mg by mouth 2 (two) times daily. Take two tablets by mouth twice a day    . perphenazine (TRILAFON) 2 MG tablet Take 2 mg by mouth 2 (two) times daily.    . phenazopyridine (PYRIDIUM) 200 MG tablet Take 1 tablet (200 mg total) by mouth 3 (three) times daily as needed for pain. 10 tablet 0  . phentermine (ADIPEX-P) 37.5 MG tablet Take 1 tablet (37.5 mg total) by mouth daily before breakfast. 30 tablet 0  . pramipexole (MIRAPEX) 0.125 MG tablet TAKE 1 TABLET AT BEDTIME AS NEEDED FOR  RESTLESS LEGS 90 tablet 3  . promethazine (PHENERGAN) 25 MG tablet TAKE 1/2 TABLET EVERY 6    HOURS AS NEEDED FOR NAUSEA OR VOMITING 90 tablet 0  . pantoprazole (PROTONIX) 40 MG tablet Take 40 mg by mouth daily.     No current facility-administered medications for this visit.    Allergies  Allergen Reactions  . Gabapentin Anaphylaxis    Sleepiness Sleepiness   . Lyrica [Pregabalin] Anaphylaxis    Made symptoms worse.  Made symptoms worse.   . Ambien [Zolpidem] Other (See Comments)    Insomnia Pt did not state her reaction   . Amitriptyline Other (See Comments)    Pt did not state her reaction       Objective:  BP 107/66   Pulse 65   Temp 97.6 F (36.4 C)   Ht 5\' 6"  (1.676 m)   Wt 177 lb (80.3 kg)   SpO2 100%   BMI 28.57 kg/m  Gen: well-groomed, cooperative, not ill-appearing, no distress HEENT: normal conjunctiva, trachea midline Pulm: Normal work of breathing, normal phonation GI: abdomen soft, nondistended, there is suprapubic tenderness, no CVA tenderness Neuro: alert and oriented x 3, no tremor MSK: extremities atraumatic, normal gait and station, no peripheral edema   Results for orders placed or performed in visit on 01/12/17 (from the past 72 hour(s))  POCT urinalysis dipstick     Status: Abnormal   Collection Time: 01/12/17  3:20 PM  Result Value Ref Range   Color, UA Amber    Clarity, UA cloudy    Glucose, UA negative    Bilirubin, UA negative    Ketones, UA negative    Spec Grav, UA >=1.030 (A) 1.010 - 1.025   Blood, UA moderate    pH, UA 5.5 5.0 - 8.0   Protein, UA negative    Urobilinogen, UA 0.2 0.2 or 1.0 E.U./dL   Nitrite, UA negative    Leukocytes, UA Negative Negative   No results found.    Assessment and Plan: 52 y.o. female with   1. Dysuria, Chronic IC w/hematuria - POCT urinalysis dipstick positive for moderate blood. Patient has chronic hematuria 2/2 IC. UA was otherwise negative, but patient is on Macrobid prophylaxis and  recently had Bactrim - will treat empirically with Ceftin - pyridium prn for pain - cefUROXime (CEFTIN) 250 MG tablet; Take 1 tablet (250 mg total) by mouth 2 (two) times daily with  a meal.  Dispense: 14 tablet; Refill: 0  2. Hiatal hernia, Hx of gastric ulcer - pantoprazole (PROTONIX) 40 MG tablet; Take 1 tablet (40 mg total) by mouth daily.  Dispense: 30 tablet; Refill: 0 - keep follow-up with GI  Patient education and anticipatory guidance given Patient agrees with treatment plan Follow-up as needed if symptoms worsen or fail to improve  Darlyne Russian PA-C

## 2017-02-09 ENCOUNTER — Telehealth: Payer: Self-pay | Admitting: Physician Assistant

## 2017-02-09 DIAGNOSIS — E663 Overweight: Secondary | ICD-10-CM

## 2017-02-09 MED ORDER — PHENTERMINE HCL 37.5 MG PO TABS
37.5000 mg | ORAL_TABLET | Freq: Every day | ORAL | 0 refills | Status: DC
Start: 2017-02-09 — End: 2017-03-11

## 2017-02-09 NOTE — Telephone Encounter (Signed)
Pt is on phentermine and lost 8lbs since June 2018. She would like a refill. She denies any side effects or concerns. She feels better and starting to walk more. BP today is 103/69.   Will send refill.

## 2017-02-15 DIAGNOSIS — M545 Low back pain: Secondary | ICD-10-CM | POA: Diagnosis not present

## 2017-02-15 DIAGNOSIS — N3010 Interstitial cystitis (chronic) without hematuria: Secondary | ICD-10-CM | POA: Diagnosis not present

## 2017-02-15 DIAGNOSIS — N329 Bladder disorder, unspecified: Secondary | ICD-10-CM | POA: Diagnosis not present

## 2017-02-15 DIAGNOSIS — Z79899 Other long term (current) drug therapy: Secondary | ICD-10-CM | POA: Diagnosis not present

## 2017-03-11 ENCOUNTER — Encounter: Payer: Self-pay | Admitting: Physician Assistant

## 2017-03-11 ENCOUNTER — Other Ambulatory Visit: Payer: Self-pay | Admitting: Physician Assistant

## 2017-03-11 DIAGNOSIS — Z8719 Personal history of other diseases of the digestive system: Secondary | ICD-10-CM

## 2017-03-11 DIAGNOSIS — E663 Overweight: Secondary | ICD-10-CM

## 2017-03-11 DIAGNOSIS — K449 Diaphragmatic hernia without obstruction or gangrene: Secondary | ICD-10-CM

## 2017-03-11 DIAGNOSIS — Z8711 Personal history of peptic ulcer disease: Secondary | ICD-10-CM

## 2017-03-11 MED ORDER — PHENTERMINE HCL 37.5 MG PO TABS: 37.5000 mg | ORAL_TABLET | Freq: Every day | ORAL | 0 refills | Status: DC

## 2017-03-11 MED ORDER — PANTOPRAZOLE SODIUM 40 MG PO TBEC: 40.0000 mg | DELAYED_RELEASE_TABLET | Freq: Every day | ORAL | 4 refills | Status: DC

## 2017-03-15 DIAGNOSIS — N3010 Interstitial cystitis (chronic) without hematuria: Secondary | ICD-10-CM | POA: Diagnosis not present

## 2017-03-15 DIAGNOSIS — Z79899 Other long term (current) drug therapy: Secondary | ICD-10-CM | POA: Diagnosis not present

## 2017-03-15 DIAGNOSIS — N329 Bladder disorder, unspecified: Secondary | ICD-10-CM | POA: Diagnosis not present

## 2017-03-16 ENCOUNTER — Ambulatory Visit (INDEPENDENT_AMBULATORY_CARE_PROVIDER_SITE_OTHER): Payer: Medicare HMO | Admitting: Physician Assistant

## 2017-03-16 ENCOUNTER — Encounter: Payer: Self-pay | Admitting: Physician Assistant

## 2017-03-16 VITALS — BP 115/64 | HR 68 | Ht 66.0 in | Wt 169.0 lb

## 2017-03-16 DIAGNOSIS — Z6827 Body mass index (BMI) 27.0-27.9, adult: Secondary | ICD-10-CM

## 2017-03-16 DIAGNOSIS — R635 Abnormal weight gain: Secondary | ICD-10-CM | POA: Diagnosis not present

## 2017-03-16 DIAGNOSIS — Z23 Encounter for immunization: Secondary | ICD-10-CM

## 2017-03-16 NOTE — Progress Notes (Signed)
Subjective:    Patient ID: [name], female    DOB: Apr 27, 1966, 51 y.o.   MRN: [id number]  HPI Pt is a 51 yo female who presents to the clinic to follow up on phentermine. She is going on her 4th month. She was 177 and now 169. She is trying to walk when she feels good. She is mostly trying to decrease portion sizes. She denies any side effects and feeling good on current regimen.   .. Active Ambulatory Problems    Diagnosis Date Noted  . TOBACCO ABUSE 05/28/2009  . MITRAL VALVE PROLAPSE 05/28/2009  . UNSPECIFIED CARDIAC DYSRHYTHMIA 05/16/2009  . TMJ PAIN 07/26/2008  . GASTRIC ULCER 05/26/2009  . NEPHROLITHIASIS 03/06/2010  . HEMATURIA UNSPECIFIED 12/19/2008  . OVARIAN CYST 03/06/2010  . PALPITATIONS 05/16/2009  . HEART MURMUR, HX OF 05/26/2009  . Depressive disorder, not elsewhere classified 11/06/2010  . Lymphocytic colitis 12/01/2011  . EBV infection 04/21/2012  . Chronic pain syndrome 06/21/2012  . Fibromyalgia 06/21/2012  . Carpal tunnel syndrome, bilateral 06/21/2012  . Degenerative disc disease, cervical 06/21/2012  . Facet arthropathy, cervical 06/21/2012  . DDD (degenerative disc disease), lumbosacral 06/21/2012  . Lower abdominal pain 07/26/2012  . Interstitial cystitis (chronic) with hematuria 04/13/2013  . Primary osteoarthritis of right knee 12/20/2013  . Incontinence in female 06/26/2014  . Chronic bilateral lower abdominal pain 06/26/2014  . Pain in joint, ankle and foot 04/21/2015  . Status post implantation of urinary electronic stimulator device 05/05/2015  . Post-menopausal 06/20/2015  . Family history of cardiovascular disease 06/20/2015  . Constipation 06/20/2015  . No energy 06/20/2015  . Dysuria 06/20/2015  . Hiatal hernia 07/16/2015  . Depression 09/03/2015  . Primary osteoarthritis of left hand 02/27/2016  . Breast pain, right 09/14/2016  . Abnormal weight gain 03/16/2017  . BMI 27.0-27.9,adult 03/16/2017   Resolved Ambulatory Problems     Diagnosis Date Noted  . Other change of lacrimal passages 03/01/2010  . URI 08/17/2008  . STONE, URINARY CALCULUS,UNSPEC. 12/29/2008  . FOOT PAIN, LEFT 04/14/2010  . Diarrhea 11/04/2009  . CONTUSION, LEFT HAND 05/10/2009  . CHEMICAL BURN 04/14/2010  . Nephrolithiasis 11/06/2010  . Bacterial folliculitis 29/47/6546  . Acute pharyngitis 01/30/2011  . BRONCHITIS, ACUTE 02/04/2011  . ABDOMINAL PAIN, ACUTE 02/04/2011   Past Medical History:  Diagnosis Date  . Anxiety   . Cancer ([place])   . Colitis   . Depression   . Dysrhythmia   . Gastric ulcer   . Headache(784.0)   . Heart murmur   . IBS (irritable bowel syndrome)   . Interstitial cystitis   . MVP (mitral valve prolapse)   . Urinary calculus or stone       Review of Systems  All other systems reviewed and are negative.      Objective:   Physical Exam  Constitutional: She is oriented to person, place, and time. She appears well-developed and well-nourished.  HENT:  Head: Normocephalic and atraumatic.  Cardiovascular: Normal rate, regular rhythm and normal heart sounds.   Pulmonary/Chest: Effort normal and breath sounds normal.  Neurological: She is alert and oriented to person, place, and time.  Psychiatric: She has a normal mood and affect. Her behavior is normal.          Assessment & Plan:  [name] KitchenMarland KitchenDiagnoses and all orders for this visit:  Abnormal weight gain  Influenza vaccine needed -     Flu Vaccine QUAD 6+ mos PF IM (Fluarix Quad PF)  BMI 27.0-27.9,adult  Ok to continue with phentermine. Follow up in one month.  [name].Discussed low carb diet with 1500 calories and 80g of protein.  Exercising at least 150 minutes a week.  [employer] could be a Microbiologist.  Great job.

## 2017-04-14 ENCOUNTER — Ambulatory Visit (INDEPENDENT_AMBULATORY_CARE_PROVIDER_SITE_OTHER): Payer: Medicare HMO | Admitting: Family Medicine

## 2017-04-14 VITALS — BP 109/64 | HR 65 | Wt 169.0 lb

## 2017-04-14 DIAGNOSIS — E663 Overweight: Secondary | ICD-10-CM

## 2017-04-14 DIAGNOSIS — Z6827 Body mass index (BMI) 27.0-27.9, adult: Secondary | ICD-10-CM | POA: Diagnosis not present

## 2017-04-14 DIAGNOSIS — R635 Abnormal weight gain: Secondary | ICD-10-CM | POA: Diagnosis not present

## 2017-04-14 MED ORDER — PHENTERMINE HCL 37.5 MG PO TABS: 37.5000 mg | ORAL_TABLET | Freq: Every day | ORAL | 0 refills | Status: DC

## 2017-04-14 NOTE — Progress Notes (Signed)
Left message on vm with Dr. Gardiner Ramus reccomendation

## 2017-04-14 NOTE — Progress Notes (Signed)
Abnormal weight gain/BMI of 27-her weight is stable over the last 4 weeks at 169 pounds.  She has not lost any additional weight.  She started out at 185 pounds in June and is down to 169 pounds.  Total of 16 pounds.  Continue to encourage her to work on healthy diet and regular exercise and tracking calories.  Follow-up with Jade at next office visit.  She needs to really lose at least 2 pounds between now and next office visit.  Beatrice Lecher, MD

## 2017-04-14 NOTE — Progress Notes (Signed)
Patient doing well on appetite suppressant.  Here for nurse visit, weight, BP, HR check.  Denies problems with insomnia, heart palpitations or tremors.  Satisfied with weight loss thus far and is working on [employer] and regular exercise.    Patient has not gained or lost any weight since the last visit. Patient states Luvenia Starch discussed with her taking the phentermine every other day at the last visit.

## 2017-04-15 DIAGNOSIS — Z79899 Other long term (current) drug therapy: Secondary | ICD-10-CM | POA: Diagnosis not present

## 2017-04-15 DIAGNOSIS — N390 Urinary tract infection, site not specified: Secondary | ICD-10-CM | POA: Diagnosis not present

## 2017-04-15 DIAGNOSIS — R300 Dysuria: Secondary | ICD-10-CM | POA: Diagnosis not present

## 2017-04-15 DIAGNOSIS — N3010 Interstitial cystitis (chronic) without hematuria: Secondary | ICD-10-CM | POA: Diagnosis not present

## 2017-04-18 ENCOUNTER — Ambulatory Visit: Payer: Medicare HMO

## 2017-04-21 DIAGNOSIS — R339 Retention of urine, unspecified: Secondary | ICD-10-CM | POA: Diagnosis not present

## 2017-05-09 ENCOUNTER — Other Ambulatory Visit: Payer: Self-pay | Admitting: Physician Assistant

## 2017-05-16 DIAGNOSIS — M545 Low back pain: Secondary | ICD-10-CM | POA: Diagnosis not present

## 2017-05-16 DIAGNOSIS — N3010 Interstitial cystitis (chronic) without hematuria: Secondary | ICD-10-CM | POA: Diagnosis not present

## 2017-05-16 DIAGNOSIS — R69 Illness, unspecified: Secondary | ICD-10-CM | POA: Diagnosis not present

## 2017-05-16 DIAGNOSIS — Z716 Tobacco abuse counseling: Secondary | ICD-10-CM | POA: Diagnosis not present

## 2017-05-16 DIAGNOSIS — Z79899 Other long term (current) drug therapy: Secondary | ICD-10-CM | POA: Diagnosis not present

## 2017-05-30 ENCOUNTER — Other Ambulatory Visit: Payer: Self-pay | Admitting: *Deleted

## 2017-05-30 MED ORDER — PROMETHAZINE HCL 25 MG PO TABS: ORAL_TABLET | ORAL | 1 refills | Status: DC

## 2017-06-03 ENCOUNTER — Encounter: Payer: Self-pay | Admitting: Family Medicine

## 2017-06-03 MED ORDER — AMBULATORY NON FORMULARY MEDICATION: 0 refills | Status: DC

## 2017-06-03 MED ORDER — PREDNISONE 10 MG PO TABS: 30.0000 mg | ORAL_TABLET | Freq: Every day | ORAL | 0 refills | Status: DC

## 2017-06-10 DIAGNOSIS — N3010 Interstitial cystitis (chronic) without hematuria: Secondary | ICD-10-CM | POA: Diagnosis not present

## 2017-06-10 DIAGNOSIS — R05 Cough: Secondary | ICD-10-CM | POA: Diagnosis not present

## 2017-06-10 DIAGNOSIS — Z716 Tobacco abuse counseling: Secondary | ICD-10-CM | POA: Diagnosis not present

## 2017-06-10 DIAGNOSIS — M545 Low back pain: Secondary | ICD-10-CM | POA: Diagnosis not present

## 2017-06-10 DIAGNOSIS — Z79899 Other long term (current) drug therapy: Secondary | ICD-10-CM | POA: Diagnosis not present

## 2017-06-10 DIAGNOSIS — R69 Illness, unspecified: Secondary | ICD-10-CM | POA: Diagnosis not present

## 2017-06-10 DIAGNOSIS — J209 Acute bronchitis, unspecified: Secondary | ICD-10-CM | POA: Diagnosis not present

## 2017-06-12 ENCOUNTER — Other Ambulatory Visit: Payer: Self-pay | Admitting: Physician Assistant

## 2017-06-12 DIAGNOSIS — Z8711 Personal history of peptic ulcer disease: Secondary | ICD-10-CM

## 2017-06-12 DIAGNOSIS — Z8719 Personal history of other diseases of the digestive system: Secondary | ICD-10-CM

## 2017-06-12 DIAGNOSIS — K449 Diaphragmatic hernia without obstruction or gangrene: Secondary | ICD-10-CM

## 2017-07-11 DIAGNOSIS — N3010 Interstitial cystitis (chronic) without hematuria: Secondary | ICD-10-CM | POA: Diagnosis not present

## 2017-07-11 DIAGNOSIS — R69 Illness, unspecified: Secondary | ICD-10-CM | POA: Diagnosis not present

## 2017-07-11 DIAGNOSIS — M545 Low back pain: Secondary | ICD-10-CM | POA: Diagnosis not present

## 2017-07-11 DIAGNOSIS — Z87891 Personal history of nicotine dependence: Secondary | ICD-10-CM | POA: Diagnosis not present

## 2017-07-11 DIAGNOSIS — Z79899 Other long term (current) drug therapy: Secondary | ICD-10-CM | POA: Diagnosis not present

## 2017-08-15 DIAGNOSIS — M545 Low back pain: Secondary | ICD-10-CM | POA: Diagnosis not present

## 2017-08-15 DIAGNOSIS — Z87891 Personal history of nicotine dependence: Secondary | ICD-10-CM | POA: Diagnosis not present

## 2017-08-15 DIAGNOSIS — N3010 Interstitial cystitis (chronic) without hematuria: Secondary | ICD-10-CM | POA: Diagnosis not present

## 2017-08-15 DIAGNOSIS — R69 Illness, unspecified: Secondary | ICD-10-CM | POA: Diagnosis not present

## 2017-08-15 DIAGNOSIS — Z79899 Other long term (current) drug therapy: Secondary | ICD-10-CM | POA: Diagnosis not present

## 2017-09-05 ENCOUNTER — Other Ambulatory Visit: Payer: Self-pay | Admitting: Physician Assistant

## 2017-09-05 DIAGNOSIS — Z1231 Encounter for screening mammogram for malignant neoplasm of breast: Secondary | ICD-10-CM

## 2017-09-16 DIAGNOSIS — Z79891 Long term (current) use of opiate analgesic: Secondary | ICD-10-CM | POA: Diagnosis not present

## 2017-09-16 DIAGNOSIS — R1084 Generalized abdominal pain: Secondary | ICD-10-CM | POA: Diagnosis not present

## 2017-09-16 DIAGNOSIS — M545 Low back pain: Secondary | ICD-10-CM | POA: Diagnosis not present

## 2017-09-16 DIAGNOSIS — Z87891 Personal history of nicotine dependence: Secondary | ICD-10-CM | POA: Diagnosis not present

## 2017-09-16 DIAGNOSIS — R69 Illness, unspecified: Secondary | ICD-10-CM | POA: Diagnosis not present

## 2017-10-04 ENCOUNTER — Ambulatory Visit
Admission: RE | Admit: 2017-10-04 | Discharge: 2017-10-04 | Disposition: A | Discharge status: Home / Self Care | Payer: Medicare HMO | Source: Ambulatory Visit | Attending: Physician Assistant | Admitting: Physician Assistant

## 2017-10-04 DIAGNOSIS — Z1231 Encounter for screening mammogram for malignant neoplasm of breast: Secondary | ICD-10-CM

## 2017-10-05 NOTE — Progress Notes (Signed)
Negative mammogram. Recommend repeat screening in 1 year

## 2017-10-14 DIAGNOSIS — Z79899 Other long term (current) drug therapy: Secondary | ICD-10-CM | POA: Diagnosis not present

## 2017-10-14 DIAGNOSIS — R300 Dysuria: Secondary | ICD-10-CM | POA: Diagnosis not present

## 2017-10-14 DIAGNOSIS — N3010 Interstitial cystitis (chronic) without hematuria: Secondary | ICD-10-CM | POA: Diagnosis not present

## 2017-10-14 DIAGNOSIS — Z79891 Long term (current) use of opiate analgesic: Secondary | ICD-10-CM | POA: Diagnosis not present

## 2017-10-14 DIAGNOSIS — M545 Low back pain: Secondary | ICD-10-CM | POA: Diagnosis not present

## 2017-11-03 DIAGNOSIS — N3010 Interstitial cystitis (chronic) without hematuria: Secondary | ICD-10-CM | POA: Diagnosis not present

## 2017-11-03 DIAGNOSIS — R69 Illness, unspecified: Secondary | ICD-10-CM | POA: Diagnosis not present

## 2017-11-11 DIAGNOSIS — K529 Noninfective gastroenteritis and colitis, unspecified: Secondary | ICD-10-CM | POA: Diagnosis not present

## 2017-11-11 DIAGNOSIS — M545 Low back pain: Secondary | ICD-10-CM | POA: Diagnosis not present

## 2017-11-11 DIAGNOSIS — R1084 Generalized abdominal pain: Secondary | ICD-10-CM | POA: Diagnosis not present

## 2017-11-11 DIAGNOSIS — Z79899 Other long term (current) drug therapy: Secondary | ICD-10-CM | POA: Diagnosis not present

## 2017-11-11 DIAGNOSIS — Z79891 Long term (current) use of opiate analgesic: Secondary | ICD-10-CM | POA: Diagnosis not present

## 2017-11-17 ENCOUNTER — Other Ambulatory Visit: Payer: Self-pay | Admitting: Physician Assistant

## 2017-11-17 ENCOUNTER — Other Ambulatory Visit: Payer: Self-pay | Admitting: *Deleted

## 2017-12-09 DIAGNOSIS — Z79899 Other long term (current) drug therapy: Secondary | ICD-10-CM | POA: Diagnosis not present

## 2017-12-09 DIAGNOSIS — N3010 Interstitial cystitis (chronic) without hematuria: Secondary | ICD-10-CM | POA: Diagnosis not present

## 2017-12-09 DIAGNOSIS — R1084 Generalized abdominal pain: Secondary | ICD-10-CM | POA: Diagnosis not present

## 2017-12-09 DIAGNOSIS — E7800 Pure hypercholesterolemia, unspecified: Secondary | ICD-10-CM | POA: Diagnosis not present

## 2017-12-09 DIAGNOSIS — N390 Urinary tract infection, site not specified: Secondary | ICD-10-CM | POA: Diagnosis not present

## 2017-12-09 LAB — HEPATIC FUNCTION PANEL
ALK PHOS: 84 (ref 25–125)
ALT: 31 (ref 7–35)
AST: 27 (ref 13–35)
Bilirubin, Total: 0.5

## 2017-12-09 LAB — BASIC METABOLIC PANEL
BUN: 19 (ref 4–21)
Creatinine: 1.2 — AB (ref 0.5–1.1)
Glucose: 91
Potassium: 5.1 (ref 3.4–5.3)
Sodium: 140 (ref 137–147)

## 2017-12-09 LAB — LIPID PANEL
CHOLESTEROL: 182 (ref 0–200)
HDL: 69 (ref 35–70)
LDL Cholesterol: 98
TRIGLYCERIDES: 78 (ref 40–160)

## 2017-12-12 ENCOUNTER — Ambulatory Visit (INDEPENDENT_AMBULATORY_CARE_PROVIDER_SITE_OTHER): Payer: Medicare HMO | Admitting: Physician Assistant

## 2017-12-12 ENCOUNTER — Encounter: Payer: Self-pay | Admitting: Physician Assistant

## 2017-12-12 VITALS — BP 107/74 | HR 71 | Ht 65.98 in | Wt 182.0 lb

## 2017-12-12 DIAGNOSIS — N390 Urinary tract infection, site not specified: Secondary | ICD-10-CM

## 2017-12-12 DIAGNOSIS — N3011 Interstitial cystitis (chronic) with hematuria: Secondary | ICD-10-CM

## 2017-12-12 DIAGNOSIS — R339 Retention of urine, unspecified: Secondary | ICD-10-CM

## 2017-12-12 LAB — POCT URINALYSIS DIPSTICK
Glucose, UA: NEGATIVE
Ketones, UA: NEGATIVE
LEUKOCYTES UA: NEGATIVE
NITRITE UA: POSITIVE
PH UA: 7 (ref 5.0–8.0)
PROTEIN UA: POSITIVE — AB
Urobilinogen, UA: 1 E.U./dL

## 2017-12-12 MED ORDER — CEFTRIAXONE SODIUM 1 G IJ SOLR
1.0000 g | Freq: Once | INTRAMUSCULAR | Status: AC
  Administered 2017-12-12: 1 g via INTRAMUSCULAR

## 2017-12-12 MED ORDER — NITROFURANTOIN MONOHYD MACRO 100 MG PO CAPS: 100.0000 mg | ORAL_CAPSULE | Freq: Two times a day (BID) | ORAL | 0 refills | Status: DC

## 2017-12-12 MED ORDER — KETOROLAC TROMETHAMINE 60 MG/2ML IM SOLN
60.0000 mg | Freq: Once | INTRAMUSCULAR | Status: AC
  Administered 2017-12-12: 60 mg via INTRAMUSCULAR

## 2017-12-12 NOTE — Progress Notes (Signed)
Subjective:    Patient ID: [name], female    DOB: [birth year], 52 y.o.   MRN: [id number]  HPI  Pt is a 52 yo female with IC who presents to the clinic with urinary retention, dysuria that is worsening. Pt sees pain clinic for her IC pain. Overall she does have more good days that bad. She noticed symptoms before last Friday pain clinic appt. UA was done and pt started on cipro and pyridium. She has had no relief of symptoms. Per pt bloodwork was also done. No fever or chills. She is nauseated but no vomiting.   .. Active Ambulatory Problems    Diagnosis Date Noted  . TOBACCO ABUSE 05/28/2009  . MITRAL VALVE PROLAPSE 05/28/2009  . UNSPECIFIED CARDIAC DYSRHYTHMIA 05/16/2009  . TMJ PAIN 07/26/2008  . GASTRIC ULCER 05/26/2009  . NEPHROLITHIASIS 03/06/2010  . HEMATURIA UNSPECIFIED 12/19/2008  . OVARIAN CYST 03/06/2010  . PALPITATIONS 05/16/2009  . HEART MURMUR, HX OF 05/26/2009  . Depressive disorder, not elsewhere classified 11/06/2010  . Lymphocytic colitis 12/01/2011  . EBV infection 04/21/2012  . Chronic pain syndrome 06/21/2012  . Fibromyalgia 06/21/2012  . Carpal tunnel syndrome, bilateral 06/21/2012  . Degenerative disc disease, cervical 06/21/2012  . Facet arthropathy, cervical 06/21/2012  . DDD (degenerative disc disease), lumbosacral 06/21/2012  . Lower abdominal pain 07/26/2012  . Interstitial cystitis (chronic) with hematuria 04/13/2013  . Primary osteoarthritis of right knee 12/20/2013  . Incontinence in female 06/26/2014  . Chronic bilateral lower abdominal pain 06/26/2014  . Pain in joint, ankle and foot 04/21/2015  . Status post implantation of urinary electronic stimulator device 05/05/2015  . Post-menopausal 06/20/2015  . Family history of cardiovascular disease 06/20/2015  . Constipation 06/20/2015  . No energy 06/20/2015  . Dysuria 06/20/2015  . Hiatal hernia 07/16/2015  . Depression 09/03/2015  . Primary osteoarthritis of left hand 02/27/2016  .  Breast pain, right 09/14/2016  . Abnormal weight gain 03/16/2017  . BMI 27.0-27.9,adult 03/16/2017   Resolved Ambulatory Problems    Diagnosis Date Noted  . Other change of lacrimal passages 03/01/2010  . URI 08/17/2008  . STONE, URINARY CALCULUS,UNSPEC. 12/29/2008  . FOOT PAIN, LEFT 04/14/2010  . Diarrhea 11/04/2009  . CONTUSION, LEFT HAND 05/10/2009  . CHEMICAL BURN 04/14/2010  . Nephrolithiasis 11/06/2010  . Bacterial folliculitis 94/70/9628  . Acute pharyngitis 01/30/2011  . BRONCHITIS, ACUTE 02/04/2011  . ABDOMINAL PAIN, ACUTE 02/04/2011   Past Medical History:  Diagnosis Date  . Anxiety   . Cancer ([place])   . Colitis   . Depression   . Dysrhythmia   . Gastric ulcer   . Headache(784.0)   . Heart murmur   . IBS (irritable bowel syndrome)   . Interstitial cystitis   . MVP (mitral valve prolapse)   . Urinary calculus or stone       Review of Systems See HPI.     Objective:   Physical Exam  Constitutional: She is oriented to person, place, and time. She appears well-developed and well-nourished.  HENT:  Head: Normocephalic and atraumatic.  Cardiovascular: Normal rate and regular rhythm.  Pulmonary/Chest: Effort normal and breath sounds normal.  No CVA tenderness.   Abdominal:  Not able to proceed with abdominal exam due to ongoing severe abdominal tenderness. (ongoing hx of this due to IC)  Neurological: She is alert and oriented to person, place, and time.  Psychiatric: She has a normal mood and affect. Her behavior is normal.  Assessment & Plan:  [name] KitchenMarland KitchenHarjot was seen today for urinary tract infection.  Diagnoses and all orders for this visit:  Acute UTI -     nitrofurantoin, macrocrystal-monohydrate, (MACROBID) 100 MG capsule; Take 1 capsule (100 mg total) by mouth 2 (two) times daily. For 7 days. -     ketorolac (TORADOL) injection 60 mg -     cefTRIAXone (ROCEPHIN) injection 1 g  Urinary retention -     POCT Urinalysis Dipstick -      nitrofurantoin, macrocrystal-monohydrate, (MACROBID) 100 MG capsule; Take 1 capsule (100 mg total) by mouth 2 (two) times daily. For 7 days. -     Urine Culture -     ketorolac (TORADOL) injection 60 mg -     cefTRIAXone (ROCEPHIN) injection 1 g  Interstitial cystitis (chronic) with hematuria -     ketorolac (TORADOL) injection 60 mg   .. Results for orders placed or performed in visit on 12/12/17  POCT Urinalysis Dipstick  Result Value Ref Range   Color, UA dark yellow    Clarity, UA clear    Glucose, UA Negative Negative   Bilirubin, UA small    Ketones, UA negative    Spec Grav, UA >=1.030 (A) 1.010 - 1.025   Blood, UA moderate    pH, UA 7.0 5.0 - 8.0   Protein, UA Positive (A) Negative   Urobilinogen, UA 1.0 0.2 or 1.0 E.U./dL   Nitrite, UA positive    Leukocytes, UA Negative Negative   Appearance     Odor     UA positive for nitrates, protein, hematuria. Will culture. Due to symptoms will stop cipro. 1gm of rocephin given with toradol for pain and to start macrobid tomorrow. Certainly this could be an IC flare but need to confirm with culture. Continue to follow up with urology.

## 2017-12-13 ENCOUNTER — Telehealth: Payer: Self-pay | Admitting: Physician Assistant

## 2017-12-13 NOTE — Telephone Encounter (Signed)
[facility] and got fax number [phone] for records and received a confirmation that labs sheet was received.

## 2017-12-13 NOTE — Telephone Encounter (Signed)
Can we attempt to get bloodwork from her pain clinic doctor [name] at [facility]? Labs that were done on Friday?

## 2017-12-14 LAB — URINE CULTURE
MICRO NUMBER:: 90784241
Result:: NO GROWTH
SPECIMEN QUALITY:: ADEQUATE

## 2017-12-14 NOTE — Progress Notes (Signed)
Call pt: absolutely no bacterial growth on urine culture. This is appears to be an IC flare and not due to infection. I would stop antibiotic. How are symptoms today?

## 2018-01-03 ENCOUNTER — Encounter: Payer: Self-pay | Admitting: Physician Assistant

## 2018-01-09 DIAGNOSIS — Z79891 Long term (current) use of opiate analgesic: Secondary | ICD-10-CM | POA: Diagnosis not present

## 2018-01-09 DIAGNOSIS — N3010 Interstitial cystitis (chronic) without hematuria: Secondary | ICD-10-CM | POA: Diagnosis not present

## 2018-01-09 DIAGNOSIS — Z6829 Body mass index (BMI) 29.0-29.9, adult: Secondary | ICD-10-CM | POA: Diagnosis not present

## 2018-01-09 DIAGNOSIS — Z79899 Other long term (current) drug therapy: Secondary | ICD-10-CM | POA: Diagnosis not present

## 2018-02-06 DIAGNOSIS — Z79891 Long term (current) use of opiate analgesic: Secondary | ICD-10-CM | POA: Diagnosis not present

## 2018-02-06 DIAGNOSIS — M545 Low back pain: Secondary | ICD-10-CM | POA: Diagnosis not present

## 2018-02-06 DIAGNOSIS — N3010 Interstitial cystitis (chronic) without hematuria: Secondary | ICD-10-CM | POA: Diagnosis not present

## 2018-02-06 DIAGNOSIS — Z79899 Other long term (current) drug therapy: Secondary | ICD-10-CM | POA: Diagnosis not present

## 2018-02-06 DIAGNOSIS — Z6829 Body mass index (BMI) 29.0-29.9, adult: Secondary | ICD-10-CM | POA: Diagnosis not present

## 2018-02-14 DIAGNOSIS — R339 Retention of urine, unspecified: Secondary | ICD-10-CM | POA: Diagnosis not present

## 2018-02-15 ENCOUNTER — Other Ambulatory Visit: Payer: Self-pay | Admitting: Physician Assistant

## 2018-02-22 DIAGNOSIS — R69 Illness, unspecified: Secondary | ICD-10-CM | POA: Diagnosis not present

## 2018-03-06 DIAGNOSIS — M545 Low back pain: Secondary | ICD-10-CM | POA: Diagnosis not present

## 2018-03-06 DIAGNOSIS — Z79899 Other long term (current) drug therapy: Secondary | ICD-10-CM | POA: Diagnosis not present

## 2018-03-06 DIAGNOSIS — Z87891 Personal history of nicotine dependence: Secondary | ICD-10-CM | POA: Diagnosis not present

## 2018-03-06 DIAGNOSIS — Z79891 Long term (current) use of opiate analgesic: Secondary | ICD-10-CM | POA: Diagnosis not present

## 2018-03-06 DIAGNOSIS — Z6829 Body mass index (BMI) 29.0-29.9, adult: Secondary | ICD-10-CM | POA: Diagnosis not present

## 2018-03-31 ENCOUNTER — Other Ambulatory Visit: Payer: Self-pay | Admitting: Physician Assistant

## 2018-04-03 DIAGNOSIS — Z79891 Long term (current) use of opiate analgesic: Secondary | ICD-10-CM | POA: Diagnosis not present

## 2018-04-03 DIAGNOSIS — Z6829 Body mass index (BMI) 29.0-29.9, adult: Secondary | ICD-10-CM | POA: Diagnosis not present

## 2018-04-03 DIAGNOSIS — N3010 Interstitial cystitis (chronic) without hematuria: Secondary | ICD-10-CM | POA: Diagnosis not present

## 2018-04-03 DIAGNOSIS — Z79899 Other long term (current) drug therapy: Secondary | ICD-10-CM | POA: Diagnosis not present

## 2018-04-11 DIAGNOSIS — R339 Retention of urine, unspecified: Secondary | ICD-10-CM | POA: Diagnosis not present

## 2018-04-18 ENCOUNTER — Other Ambulatory Visit: Payer: Self-pay | Admitting: Physician Assistant

## 2018-04-18 ENCOUNTER — Ambulatory Visit (INDEPENDENT_AMBULATORY_CARE_PROVIDER_SITE_OTHER): Payer: Medicare HMO | Admitting: Physician Assistant

## 2018-04-18 ENCOUNTER — Encounter: Payer: Self-pay | Admitting: Physician Assistant

## 2018-04-18 VITALS — BP 127/77 | HR 96 | Ht 65.98 in | Wt 172.0 lb

## 2018-04-18 DIAGNOSIS — F330 Major depressive disorder, recurrent, mild: Secondary | ICD-10-CM

## 2018-04-18 DIAGNOSIS — R69 Illness, unspecified: Secondary | ICD-10-CM | POA: Diagnosis not present

## 2018-04-18 DIAGNOSIS — Z23 Encounter for immunization: Secondary | ICD-10-CM | POA: Diagnosis not present

## 2018-04-18 DIAGNOSIS — N3011 Interstitial cystitis (chronic) with hematuria: Secondary | ICD-10-CM

## 2018-04-18 DIAGNOSIS — M797 Fibromyalgia: Secondary | ICD-10-CM | POA: Diagnosis not present

## 2018-04-18 DIAGNOSIS — G2581 Restless legs syndrome: Secondary | ICD-10-CM

## 2018-04-18 DIAGNOSIS — Z8719 Personal history of other diseases of the digestive system: Secondary | ICD-10-CM

## 2018-04-18 DIAGNOSIS — K449 Diaphragmatic hernia without obstruction or gangrene: Secondary | ICD-10-CM

## 2018-04-18 DIAGNOSIS — Z8711 Personal history of peptic ulcer disease: Secondary | ICD-10-CM

## 2018-04-18 MED ORDER — DULOXETINE HCL 30 MG PO CPEP: 60.0000 mg | ORAL_CAPSULE | Freq: Every day | ORAL | 4 refills | Status: DC

## 2018-04-18 MED ORDER — PRAMIPEXOLE DIHYDROCHLORIDE 0.125 MG PO TABS: ORAL_TABLET | ORAL | 4 refills | Status: DC

## 2018-04-18 MED ORDER — CYCLOBENZAPRINE HCL 10 MG PO TABS: 10.0000 mg | ORAL_TABLET | Freq: Three times a day (TID) | ORAL | 0 refills | Status: DC | PRN

## 2018-04-18 NOTE — Progress Notes (Signed)
Subjective:    Patient ID: [name], female    DOB: 07/02/1965, 52 y.o.   MRN: [id number]  HPI Pt is a 52 yo female with chronic pain due to IC, fibromyagia and has ongoing depression.   She seeing IC specialist. She goes to pain clinic for pain control.   She needs refills of mirapex, cymbalta and flexeril.   She is doing ok. Denies any SI/HC. She is having some marital problems and thinking about leaving her husband. She has a place to go. Today is a good day with pain. Overall she is managed very well.   .. Active Ambulatory Problems    Diagnosis Date Noted  . TOBACCO ABUSE 05/28/2009  . MITRAL VALVE PROLAPSE 05/28/2009  . UNSPECIFIED CARDIAC DYSRHYTHMIA 05/16/2009  . TMJ PAIN 07/26/2008  . GASTRIC ULCER 05/26/2009  . NEPHROLITHIASIS 03/06/2010  . HEMATURIA UNSPECIFIED 12/19/2008  . OVARIAN CYST 03/06/2010  . PALPITATIONS 05/16/2009  . HEART MURMUR, HX OF 05/26/2009  . Depressive disorder, not elsewhere classified 11/06/2010  . Lymphocytic colitis 12/01/2011  . EBV infection 04/21/2012  . Chronic pain syndrome 06/21/2012  . Fibromyalgia 06/21/2012  . Carpal tunnel syndrome, bilateral 06/21/2012  . Degenerative disc disease, cervical 06/21/2012  . Facet arthropathy, cervical 06/21/2012  . DDD (degenerative disc disease), lumbosacral 06/21/2012  . Lower abdominal pain 07/26/2012  . Interstitial cystitis (chronic) with hematuria 04/13/2013  . Primary osteoarthritis of right knee 12/20/2013  . Incontinence in female 06/26/2014  . Chronic bilateral lower abdominal pain 06/26/2014  . Pain in joint, ankle and foot 04/21/2015  . Status post implantation of urinary electronic stimulator device 05/05/2015  . Post-menopausal 06/20/2015  . Family history of cardiovascular disease 06/20/2015  . Constipation 06/20/2015  . No energy 06/20/2015  . Dysuria 06/20/2015  . Hiatal hernia 07/16/2015  . Depression 09/03/2015  . Primary osteoarthritis of left hand 02/27/2016  .  Breast pain, right 09/14/2016  . Abnormal weight gain 03/16/2017  . BMI 27.0-27.9,adult 03/16/2017   Resolved Ambulatory Problems    Diagnosis Date Noted  . Other change of lacrimal passages 03/01/2010  . URI 08/17/2008  . STONE, URINARY CALCULUS,UNSPEC. 12/29/2008  . FOOT PAIN, LEFT 04/14/2010  . Diarrhea 11/04/2009  . CONTUSION, LEFT HAND 05/10/2009  . CHEMICAL BURN 04/14/2010  . Nephrolithiasis 11/06/2010  . Bacterial folliculitis 28/31/5176  . Acute pharyngitis 01/30/2011  . BRONCHITIS, ACUTE 02/04/2011  . ABDOMINAL PAIN, ACUTE 02/04/2011   Past Medical History:  Diagnosis Date  . Anxiety   . Cancer ([place])   . Colitis   . Dysrhythmia   . Gastric ulcer   . Headache(784.0)   . Heart murmur   . IBS (irritable bowel syndrome)   . Interstitial cystitis   . MVP (mitral valve prolapse)   . Urinary calculus or stone       Review of Systems See HPI.     Objective:   Physical Exam  Constitutional: She is oriented to person, place, and time. She appears well-developed and well-nourished.  HENT:  Head: Normocephalic and atraumatic.  Cardiovascular: Normal rate and regular rhythm.  Pulmonary/Chest: Effort normal and breath sounds normal.  Neurological: She is alert and oriented to person, place, and time.  Skin: Skin is warm.  Psychiatric: Her behavior is normal.          Assessment & Plan:  [name] KitchenMarland KitchenDiagnoses and all orders for this visit:  Mild episode of recurrent major depressive disorder (HCC) -     DULoxetine (CYMBALTA) 30 MG capsule; Take  2 capsules (60 mg total) by mouth daily.  Need for immunization against influenza -     Flu Vaccine QUAD 36+ mos IM  Interstitial cystitis (chronic) with hematuria  RLS (restless legs syndrome) -     pramipexole (MIRAPEX) 0.125 MG tablet; TAKE 1 TABLET AT BEDTIME AS NEEDED FOR RESTLESS LEGS  Fibromyalgia -     cyclobenzaprine (FLEXERIL) 10 MG tablet; Take 1 tablet (10 mg total) by mouth 3 (three) times daily as needed for  muscle spasms.  Other orders -     Discontinue: DULoxetine (CYMBALTA) 30 MG capsule; Take 2 capsules (60 mg total) by mouth daily. -     Discontinue: pramipexole (MIRAPEX) 0.125 MG tablet; TAKE 1 TABLET AT BEDTIME AS NEEDED FOR RESTLESS LEGS   .[name] Depression screen [facility] 2/9 04/22/2018 12/12/2017 03/16/2017  Decreased Interest 0 2 1  Down, Depressed, Hopeless 1 2 2   PHQ - 2 Score 1 4 3   Altered sleeping 1 1 1   Tired, decreased energy 1 1 1   Change in appetite 0 1 1  Feeling bad or failure about yourself  0 1 2  Trouble concentrating 0 0 1  Moving slowly or fidgety/restless 0 0 0  Suicidal thoughts 0 0 0  PHQ-9 Score 3 8 9   Difficult doing work/chores - Somewhat difficult -   .. GAD 7 : Generalized Anxiety Score 04/22/2018 12/12/2017  Nervous, Anxious, on Edge 1 0  Control/stop worrying 1 1  Worry too much - different things 1 1  Trouble relaxing 1 -  Restless 1 0  Easily annoyed or irritable 1 0  Afraid - awful might happen 0 0  Total GAD 7 Score 6 -  Anxiety Difficulty Somewhat difficult Somewhat difficult    Pt is doing ok with her mood. She is having some marital problems. Discussed counseling. Pt declined today. Refilled cymbalta.   Refilled mirapax/flexeril.   Continue to follow up with pain clinic.   Continue to follow up with urologist for IC.

## 2018-04-20 ENCOUNTER — Other Ambulatory Visit: Payer: Self-pay | Admitting: Physician Assistant

## 2018-05-02 DIAGNOSIS — Z79891 Long term (current) use of opiate analgesic: Secondary | ICD-10-CM | POA: Diagnosis not present

## 2018-05-02 DIAGNOSIS — N3010 Interstitial cystitis (chronic) without hematuria: Secondary | ICD-10-CM | POA: Diagnosis not present

## 2018-05-02 DIAGNOSIS — Z6829 Body mass index (BMI) 29.0-29.9, adult: Secondary | ICD-10-CM | POA: Diagnosis not present

## 2018-05-02 DIAGNOSIS — Z79899 Other long term (current) drug therapy: Secondary | ICD-10-CM | POA: Diagnosis not present

## 2018-05-02 DIAGNOSIS — Z87891 Personal history of nicotine dependence: Secondary | ICD-10-CM | POA: Diagnosis not present

## 2018-05-09 DIAGNOSIS — N3010 Interstitial cystitis (chronic) without hematuria: Secondary | ICD-10-CM | POA: Diagnosis not present

## 2018-05-09 DIAGNOSIS — R319 Hematuria, unspecified: Secondary | ICD-10-CM | POA: Diagnosis not present

## 2018-05-09 DIAGNOSIS — Z09 Encounter for follow-up examination after completed treatment for conditions other than malignant neoplasm: Secondary | ICD-10-CM | POA: Diagnosis not present

## 2018-05-09 DIAGNOSIS — R102 Pelvic and perineal pain: Secondary | ICD-10-CM | POA: Diagnosis not present

## 2018-05-09 DIAGNOSIS — R69 Illness, unspecified: Secondary | ICD-10-CM | POA: Diagnosis not present

## 2018-06-01 DIAGNOSIS — R339 Retention of urine, unspecified: Secondary | ICD-10-CM | POA: Diagnosis not present

## 2018-06-08 DIAGNOSIS — Z79891 Long term (current) use of opiate analgesic: Secondary | ICD-10-CM | POA: Diagnosis not present

## 2018-06-08 DIAGNOSIS — N3010 Interstitial cystitis (chronic) without hematuria: Secondary | ICD-10-CM | POA: Diagnosis not present

## 2018-06-08 DIAGNOSIS — Z79899 Other long term (current) drug therapy: Secondary | ICD-10-CM | POA: Diagnosis not present

## 2018-06-08 DIAGNOSIS — R69 Illness, unspecified: Secondary | ICD-10-CM | POA: Diagnosis not present

## 2018-06-19 DIAGNOSIS — R3129 Other microscopic hematuria: Secondary | ICD-10-CM | POA: Diagnosis not present

## 2018-06-19 DIAGNOSIS — Z79891 Long term (current) use of opiate analgesic: Secondary | ICD-10-CM | POA: Diagnosis not present

## 2018-06-19 DIAGNOSIS — M545 Low back pain: Secondary | ICD-10-CM | POA: Diagnosis not present

## 2018-06-19 DIAGNOSIS — N200 Calculus of kidney: Secondary | ICD-10-CM | POA: Diagnosis not present

## 2018-06-19 DIAGNOSIS — R69 Illness, unspecified: Secondary | ICD-10-CM | POA: Diagnosis not present

## 2018-06-19 DIAGNOSIS — R102 Pelvic and perineal pain: Secondary | ICD-10-CM | POA: Diagnosis not present

## 2018-06-19 DIAGNOSIS — N3011 Interstitial cystitis (chronic) with hematuria: Secondary | ICD-10-CM | POA: Diagnosis not present

## 2018-06-19 DIAGNOSIS — N3010 Interstitial cystitis (chronic) without hematuria: Secondary | ICD-10-CM | POA: Diagnosis not present

## 2018-06-19 DIAGNOSIS — R319 Hematuria, unspecified: Secondary | ICD-10-CM | POA: Diagnosis not present

## 2018-06-19 DIAGNOSIS — N281 Cyst of kidney, acquired: Secondary | ICD-10-CM | POA: Diagnosis not present

## 2018-06-19 DIAGNOSIS — R1030 Lower abdominal pain, unspecified: Secondary | ICD-10-CM | POA: Diagnosis not present

## 2018-06-29 DIAGNOSIS — R339 Retention of urine, unspecified: Secondary | ICD-10-CM | POA: Diagnosis not present

## 2018-07-05 DIAGNOSIS — Z79891 Long term (current) use of opiate analgesic: Secondary | ICD-10-CM | POA: Diagnosis not present

## 2018-07-05 DIAGNOSIS — R69 Illness, unspecified: Secondary | ICD-10-CM | POA: Diagnosis not present

## 2018-07-05 DIAGNOSIS — N3010 Interstitial cystitis (chronic) without hematuria: Secondary | ICD-10-CM | POA: Diagnosis not present

## 2018-07-05 DIAGNOSIS — Z79899 Other long term (current) drug therapy: Secondary | ICD-10-CM | POA: Diagnosis not present

## 2018-07-17 ENCOUNTER — Other Ambulatory Visit: Payer: Self-pay

## 2018-07-17 MED ORDER — PROMETHAZINE HCL 25 MG PO TABS: ORAL_TABLET | ORAL | 1 refills | Status: DC

## 2018-08-03 DIAGNOSIS — Z6829 Body mass index (BMI) 29.0-29.9, adult: Secondary | ICD-10-CM | POA: Diagnosis not present

## 2018-08-03 DIAGNOSIS — Z79899 Other long term (current) drug therapy: Secondary | ICD-10-CM | POA: Diagnosis not present

## 2018-08-03 DIAGNOSIS — Z79891 Long term (current) use of opiate analgesic: Secondary | ICD-10-CM | POA: Diagnosis not present

## 2018-08-03 DIAGNOSIS — M545 Low back pain: Secondary | ICD-10-CM | POA: Diagnosis not present

## 2018-08-14 DIAGNOSIS — R339 Retention of urine, unspecified: Secondary | ICD-10-CM | POA: Diagnosis not present

## 2018-09-11 DIAGNOSIS — Z79899 Other long term (current) drug therapy: Secondary | ICD-10-CM | POA: Diagnosis not present

## 2018-09-11 DIAGNOSIS — M545 Low back pain: Secondary | ICD-10-CM | POA: Diagnosis not present

## 2018-09-11 DIAGNOSIS — Z87891 Personal history of nicotine dependence: Secondary | ICD-10-CM | POA: Diagnosis not present

## 2018-09-11 DIAGNOSIS — R69 Illness, unspecified: Secondary | ICD-10-CM | POA: Diagnosis not present

## 2018-09-11 DIAGNOSIS — N3010 Interstitial cystitis (chronic) without hematuria: Secondary | ICD-10-CM | POA: Diagnosis not present

## 2018-09-11 DIAGNOSIS — Z79891 Long term (current) use of opiate analgesic: Secondary | ICD-10-CM | POA: Diagnosis not present

## 2018-09-13 DIAGNOSIS — R69 Illness, unspecified: Secondary | ICD-10-CM | POA: Diagnosis not present

## 2018-10-13 DIAGNOSIS — M545 Low back pain: Secondary | ICD-10-CM | POA: Diagnosis not present

## 2018-10-13 DIAGNOSIS — N3010 Interstitial cystitis (chronic) without hematuria: Secondary | ICD-10-CM | POA: Diagnosis not present

## 2018-10-13 DIAGNOSIS — R69 Illness, unspecified: Secondary | ICD-10-CM | POA: Diagnosis not present

## 2018-10-13 DIAGNOSIS — Z79899 Other long term (current) drug therapy: Secondary | ICD-10-CM | POA: Diagnosis not present

## 2018-10-13 DIAGNOSIS — Z79891 Long term (current) use of opiate analgesic: Secondary | ICD-10-CM | POA: Diagnosis not present

## 2018-11-01 DIAGNOSIS — R339 Retention of urine, unspecified: Secondary | ICD-10-CM | POA: Diagnosis not present

## 2018-11-14 DIAGNOSIS — R69 Illness, unspecified: Secondary | ICD-10-CM | POA: Diagnosis not present

## 2018-11-14 DIAGNOSIS — N3010 Interstitial cystitis (chronic) without hematuria: Secondary | ICD-10-CM | POA: Diagnosis not present

## 2018-11-14 DIAGNOSIS — M545 Low back pain: Secondary | ICD-10-CM | POA: Diagnosis not present

## 2018-11-14 DIAGNOSIS — Z79899 Other long term (current) drug therapy: Secondary | ICD-10-CM | POA: Diagnosis not present

## 2018-11-14 DIAGNOSIS — Z79891 Long term (current) use of opiate analgesic: Secondary | ICD-10-CM | POA: Diagnosis not present

## 2018-11-26 ENCOUNTER — Other Ambulatory Visit: Payer: Self-pay | Admitting: Physician Assistant

## 2018-11-27 NOTE — Telephone Encounter (Signed)
Please advise on refill.

## 2018-12-13 DIAGNOSIS — M545 Low back pain: Secondary | ICD-10-CM | POA: Diagnosis not present

## 2018-12-13 DIAGNOSIS — R69 Illness, unspecified: Secondary | ICD-10-CM | POA: Diagnosis not present

## 2018-12-13 DIAGNOSIS — Z79899 Other long term (current) drug therapy: Secondary | ICD-10-CM | POA: Diagnosis not present

## 2018-12-13 DIAGNOSIS — N3010 Interstitial cystitis (chronic) without hematuria: Secondary | ICD-10-CM | POA: Diagnosis not present

## 2018-12-13 DIAGNOSIS — Z79891 Long term (current) use of opiate analgesic: Secondary | ICD-10-CM | POA: Diagnosis not present

## 2018-12-27 DIAGNOSIS — R102 Pelvic and perineal pain: Secondary | ICD-10-CM | POA: Diagnosis not present

## 2018-12-27 DIAGNOSIS — R319 Hematuria, unspecified: Secondary | ICD-10-CM | POA: Diagnosis not present

## 2018-12-28 DIAGNOSIS — R339 Retention of urine, unspecified: Secondary | ICD-10-CM | POA: Diagnosis not present

## 2019-01-12 DIAGNOSIS — R69 Illness, unspecified: Secondary | ICD-10-CM | POA: Diagnosis not present

## 2019-01-12 DIAGNOSIS — Z79899 Other long term (current) drug therapy: Secondary | ICD-10-CM | POA: Diagnosis not present

## 2019-01-12 DIAGNOSIS — M545 Low back pain: Secondary | ICD-10-CM | POA: Diagnosis not present

## 2019-01-12 DIAGNOSIS — Z79891 Long term (current) use of opiate analgesic: Secondary | ICD-10-CM | POA: Diagnosis not present

## 2019-01-12 DIAGNOSIS — N3010 Interstitial cystitis (chronic) without hematuria: Secondary | ICD-10-CM | POA: Diagnosis not present

## 2019-01-26 DIAGNOSIS — R339 Retention of urine, unspecified: Secondary | ICD-10-CM | POA: Diagnosis not present

## 2019-02-06 ENCOUNTER — Other Ambulatory Visit: Payer: Self-pay | Admitting: Physician Assistant

## 2019-02-06 DIAGNOSIS — F330 Major depressive disorder, recurrent, mild: Secondary | ICD-10-CM

## 2019-02-06 DIAGNOSIS — G2581 Restless legs syndrome: Secondary | ICD-10-CM

## 2019-02-15 DIAGNOSIS — Z1159 Encounter for screening for other viral diseases: Secondary | ICD-10-CM | POA: Diagnosis not present

## 2019-02-15 DIAGNOSIS — R69 Illness, unspecified: Secondary | ICD-10-CM | POA: Diagnosis not present

## 2019-02-15 DIAGNOSIS — Z79899 Other long term (current) drug therapy: Secondary | ICD-10-CM | POA: Diagnosis not present

## 2019-02-15 DIAGNOSIS — E7800 Pure hypercholesterolemia, unspecified: Secondary | ICD-10-CM | POA: Diagnosis not present

## 2019-02-15 DIAGNOSIS — R5383 Other fatigue: Secondary | ICD-10-CM | POA: Diagnosis not present

## 2019-02-15 DIAGNOSIS — E559 Vitamin D deficiency, unspecified: Secondary | ICD-10-CM | POA: Diagnosis not present

## 2019-02-15 DIAGNOSIS — R7302 Impaired glucose tolerance (oral): Secondary | ICD-10-CM | POA: Diagnosis not present

## 2019-02-19 DIAGNOSIS — R69 Illness, unspecified: Secondary | ICD-10-CM | POA: Diagnosis not present

## 2019-02-20 DIAGNOSIS — R69 Illness, unspecified: Secondary | ICD-10-CM | POA: Diagnosis not present

## 2019-03-16 ENCOUNTER — Other Ambulatory Visit: Payer: Self-pay | Admitting: Physician Assistant

## 2019-03-16 DIAGNOSIS — R69 Illness, unspecified: Secondary | ICD-10-CM | POA: Diagnosis not present

## 2019-03-16 DIAGNOSIS — Z79891 Long term (current) use of opiate analgesic: Secondary | ICD-10-CM | POA: Diagnosis not present

## 2019-03-16 DIAGNOSIS — M545 Low back pain: Secondary | ICD-10-CM | POA: Diagnosis not present

## 2019-03-16 DIAGNOSIS — Z79899 Other long term (current) drug therapy: Secondary | ICD-10-CM | POA: Diagnosis not present

## 2019-03-16 DIAGNOSIS — L239 Allergic contact dermatitis, unspecified cause: Secondary | ICD-10-CM | POA: Diagnosis not present

## 2019-03-16 DIAGNOSIS — Z23 Encounter for immunization: Secondary | ICD-10-CM | POA: Diagnosis not present

## 2019-03-20 ENCOUNTER — Other Ambulatory Visit: Payer: Self-pay | Admitting: Physician Assistant

## 2019-03-20 DIAGNOSIS — G2581 Restless legs syndrome: Secondary | ICD-10-CM

## 2019-03-20 DIAGNOSIS — Z8711 Personal history of peptic ulcer disease: Secondary | ICD-10-CM

## 2019-03-20 DIAGNOSIS — Z8719 Personal history of other diseases of the digestive system: Secondary | ICD-10-CM

## 2019-03-20 DIAGNOSIS — F330 Major depressive disorder, recurrent, mild: Secondary | ICD-10-CM

## 2019-03-20 DIAGNOSIS — K449 Diaphragmatic hernia without obstruction or gangrene: Secondary | ICD-10-CM

## 2019-03-22 DIAGNOSIS — R339 Retention of urine, unspecified: Secondary | ICD-10-CM | POA: Diagnosis not present

## 2019-03-28 ENCOUNTER — Ambulatory Visit: Payer: Medicare HMO | Admitting: Physician Assistant

## 2019-03-30 ENCOUNTER — Other Ambulatory Visit: Payer: Self-pay | Admitting: Physician Assistant

## 2019-03-30 DIAGNOSIS — G2581 Restless legs syndrome: Secondary | ICD-10-CM

## 2019-04-02 ENCOUNTER — Other Ambulatory Visit: Payer: Self-pay

## 2019-04-02 ENCOUNTER — Encounter: Payer: Self-pay | Admitting: Physician Assistant

## 2019-04-02 ENCOUNTER — Ambulatory Visit (INDEPENDENT_AMBULATORY_CARE_PROVIDER_SITE_OTHER): Payer: Medicare HMO | Admitting: Physician Assistant

## 2019-04-02 VITALS — BP 136/83 | HR 89 | Ht 66.0 in | Wt 177.0 lb

## 2019-04-02 DIAGNOSIS — Z0000 Encounter for general adult medical examination without abnormal findings: Secondary | ICD-10-CM | POA: Diagnosis not present

## 2019-04-02 DIAGNOSIS — R110 Nausea: Secondary | ICD-10-CM | POA: Diagnosis not present

## 2019-04-02 DIAGNOSIS — G2581 Restless legs syndrome: Secondary | ICD-10-CM

## 2019-04-02 DIAGNOSIS — Z131 Encounter for screening for diabetes mellitus: Secondary | ICD-10-CM | POA: Diagnosis not present

## 2019-04-02 DIAGNOSIS — N3011 Interstitial cystitis (chronic) with hematuria: Secondary | ICD-10-CM | POA: Diagnosis not present

## 2019-04-02 DIAGNOSIS — Z13220 Encounter for screening for lipoid disorders: Secondary | ICD-10-CM | POA: Diagnosis not present

## 2019-04-02 DIAGNOSIS — G894 Chronic pain syndrome: Secondary | ICD-10-CM

## 2019-04-02 DIAGNOSIS — Z8719 Personal history of other diseases of the digestive system: Secondary | ICD-10-CM

## 2019-04-02 DIAGNOSIS — Z23 Encounter for immunization: Secondary | ICD-10-CM

## 2019-04-02 DIAGNOSIS — Z8711 Personal history of peptic ulcer disease: Secondary | ICD-10-CM

## 2019-04-02 DIAGNOSIS — K449 Diaphragmatic hernia without obstruction or gangrene: Secondary | ICD-10-CM | POA: Diagnosis not present

## 2019-04-02 DIAGNOSIS — E7800 Pure hypercholesterolemia, unspecified: Secondary | ICD-10-CM | POA: Diagnosis not present

## 2019-04-02 DIAGNOSIS — F330 Major depressive disorder, recurrent, mild: Secondary | ICD-10-CM

## 2019-04-02 DIAGNOSIS — R69 Illness, unspecified: Secondary | ICD-10-CM | POA: Diagnosis not present

## 2019-04-02 MED ORDER — AMBULATORY NON FORMULARY MEDICATION: 0 refills | Status: DC

## 2019-04-02 MED ORDER — DULOXETINE HCL 30 MG PO CPEP: 60.0000 mg | ORAL_CAPSULE | Freq: Every day | ORAL | 3 refills | Status: DC

## 2019-04-02 MED ORDER — PRAMIPEXOLE DIHYDROCHLORIDE 0.125 MG PO TABS: ORAL_TABLET | ORAL | 3 refills | Status: DC

## 2019-04-02 MED ORDER — PROMETHAZINE HCL 25 MG PO TABS: ORAL_TABLET | ORAL | 1 refills | Status: DC

## 2019-04-02 MED ORDER — PANTOPRAZOLE SODIUM 40 MG PO TBEC: 40.0000 mg | DELAYED_RELEASE_TABLET | Freq: Every day | ORAL | 3 refills | Status: DC

## 2019-04-02 NOTE — Progress Notes (Signed)
Subjective:    Patient ID: [name], female    DOB: [birth year], 53 y.o.   MRN: [id number]  HPI Pt is a 53 yo female with IC, chronic pain, RLS, MDD who presents to the clinic for medication refills.   Overall she is doing ok. No problems or concerns. She just needs her medication refilled.   Her IC continues to be painful. She continues to work with Dr. Amalia Hailey for new treatments. She hopes to get a pain stimulator to replace the one that its battery is dead soon.   She denies any SI/HC. She is compliant with medications.   .. Active Ambulatory Problems    Diagnosis Date Noted  . TOBACCO ABUSE 05/28/2009  . MITRAL VALVE PROLAPSE 05/28/2009  . UNSPECIFIED CARDIAC DYSRHYTHMIA 05/16/2009  . TMJ PAIN 07/26/2008  . GASTRIC ULCER 05/26/2009  . NEPHROLITHIASIS 03/06/2010  . HEMATURIA UNSPECIFIED 12/19/2008  . OVARIAN CYST 03/06/2010  . PALPITATIONS 05/16/2009  . HEART MURMUR, HX OF 05/26/2009  . Depressive disorder, not elsewhere classified 11/06/2010  . Lymphocytic colitis 12/01/2011  . EBV infection 04/21/2012  . Chronic pain syndrome 06/21/2012  . Fibromyalgia 06/21/2012  . Carpal tunnel syndrome, bilateral 06/21/2012  . Degenerative disc disease, cervical 06/21/2012  . Facet arthropathy, cervical 06/21/2012  . DDD (degenerative disc disease), lumbosacral 06/21/2012  . Lower abdominal pain 07/26/2012  . Interstitial cystitis (chronic) with hematuria 04/13/2013  . Primary osteoarthritis of right knee 12/20/2013  . Incontinence in female 06/26/2014  . Chronic bilateral lower abdominal pain 06/26/2014  . Pain in joint, ankle and foot 04/21/2015  . Status post implantation of urinary electronic stimulator device 05/05/2015  . Post-menopausal 06/20/2015  . Family history of cardiovascular disease 06/20/2015  . Constipation 06/20/2015  . No energy 06/20/2015  . Dysuria 06/20/2015  . Hiatal hernia 07/16/2015  . Depression 09/03/2015  . Primary osteoarthritis of left hand  02/27/2016  . Breast pain, right 09/14/2016  . Abnormal weight gain 03/16/2017  . BMI 27.0-27.9,adult 03/16/2017  . History of gastric ulcer 04/09/2019  . RLS (restless legs syndrome) 04/09/2019   Resolved Ambulatory Problems    Diagnosis Date Noted  . Other change of lacrimal passages 03/01/2010  . URI 08/17/2008  . STONE, URINARY CALCULUS,UNSPEC. 12/29/2008  . FOOT PAIN, LEFT 04/14/2010  . Diarrhea 11/04/2009  . CONTUSION, LEFT HAND 05/10/2009  . CHEMICAL BURN 04/14/2010  . Nephrolithiasis 11/06/2010  . Bacterial folliculitis [id number]  . Acute pharyngitis 01/30/2011  . BRONCHITIS, ACUTE 02/04/2011  . ABDOMINAL PAIN, ACUTE 02/04/2011   Past Medical History:  Diagnosis Date  . Anxiety   . Cancer ([place])   . Colitis   . Dysrhythmia   . Gastric ulcer   . Headache(784.0)   . Heart murmur   . IBS (irritable bowel syndrome)   . Interstitial cystitis   . MVP (mitral valve prolapse)   . Urinary calculus or stone       Review of Systems  All other systems reviewed and are negative.      Objective:   Physical Exam Vitals signs reviewed.  Constitutional:      Appearance: Normal appearance.  HENT:     Head: Normocephalic.  Cardiovascular:     Rate and Rhythm: Normal rate and regular rhythm.  Pulmonary:     Effort: Pulmonary effort is normal.     Breath sounds: Normal breath sounds.  Abdominal:     Comments: VERY tender abdomen to palpation. Pt request not to touch due to ongoing tenderness.  Neurological:     General: No focal deficit present.     Mental Status: She is alert and oriented to person, place, and time.  Psychiatric:        Mood and Affect: Mood normal.          . Depression screen [facility] 2/9 04/02/2019 04/22/2018 12/12/2017 03/16/2017  Decreased Interest 2 0 2 1  Down, Depressed, Hopeless 2 1 2 2   PHQ - 2 Score 4 1 4 3   Altered sleeping 2 1 1 1   Tired, decreased energy 1 1 1 1   Change in appetite 1 0 1 1  Feeling bad or failure about yourself   2 0 1 2  Trouble concentrating 0 0 0 1  Moving slowly or fidgety/restless 0 0 0 0  Suicidal thoughts 0 0 0 0  PHQ-9 Score 10 3 8 9   Difficult doing work/chores Very difficult - Somewhat difficult -   .. GAD 7 : Generalized Anxiety Score 04/02/2019 04/22/2018 12/12/2017  Nervous, Anxious, on Edge 1 1 0  Control/stop worrying 2 1 1   Worry too much - different things 2 1 1   Trouble relaxing 1 1 -  Restless 1 1 0  Easily annoyed or irritable 2 1 0  Afraid - awful might happen 1 0 0  Total GAD 7 Score 10 6 -  Anxiety Difficulty Somewhat difficult Somewhat difficult Somewhat difficult     Assessment & Plan:  [name] KitchenMarland KitchenAnett was seen today for annual exam.  Diagnoses and all orders for this visit:  Screening for lipid disorders -     Lipid Panel w/reflex Direct LDL  RLS (restless legs syndrome) -     pramipexole (MIRAPEX) 0.125 MG tablet; TAKE 1 TABLET AT BEDTIME AS NEEDED FOR RESTLESS LEGS  Hiatal hernia -     pantoprazole (PROTONIX) 40 MG tablet; Take 1 tablet (40 mg total) by mouth daily.  History of gastric ulcer -     pantoprazole (PROTONIX) 40 MG tablet; Take 1 tablet (40 mg total) by mouth daily.  Mild episode of recurrent major depressive disorder (HCC) -     DULoxetine (CYMBALTA) 30 MG capsule; Take 2 capsules (60 mg total) by mouth daily.  Screening for diabetes mellitus -     COMPLETE METABOLIC PANEL WITH GFR  Preventative health care -     Lipid Panel w/reflex Direct LDL -     COMPLETE METABOLIC PANEL WITH GFR -     CBC  Nausea -     promethazine (PHENERGAN) 25 MG tablet; Take one tablet as needed for nausea.  Need for shingles vaccine -     AMBULATORY NON FORMULARY MEDICATION; shingrx 2 doses to prevent shingles.   6 month follow up.  Labs needed.  Refills given.  Shingles vaccine started. 2 months next dose.

## 2019-04-03 LAB — CBC
HCT: 46.4 % — ABNORMAL HIGH (ref 35.0–45.0)
Hemoglobin: 15.5 g/dL (ref 11.7–15.5)
MCH: 30.3 pg (ref 27.0–33.0)
MCHC: 33.4 g/dL (ref 32.0–36.0)
MCV: 90.6 fL (ref 80.0–100.0)
MPV: 9.8 fL (ref 7.5–12.5)
Platelets: 377 10*3/uL (ref 140–400)
RBC: 5.12 10*6/uL — ABNORMAL HIGH (ref 3.80–5.10)
RDW: 13.1 % (ref 11.0–15.0)
WBC: 8.7 10*3/uL (ref 3.8–10.8)

## 2019-04-03 LAB — LIPID PANEL W/REFLEX DIRECT LDL
Cholesterol: 190 mg/dL (ref <200)
HDL: 72 mg/dL (ref >=50)
LDL Cholesterol (Calc): 101 mg/dL (calc) — ABNORMAL HIGH
Non-HDL Cholesterol (Calc): 118 mg/dL (calc) (ref <130)
Total CHOL/HDL Ratio: 2.6 (calc) (ref <5.0)
Triglycerides: 77 mg/dL (ref <150)

## 2019-04-03 LAB — COMPLETE METABOLIC PANEL WITH GFR
AG Ratio: 1.7 (calc) (ref 1.0–2.5)
ALT: 19 U/L (ref 6–29)
AST: 21 U/L (ref 10–35)
Albumin: 4.2 g/dL (ref 3.6–5.1)
Alkaline phosphatase (APISO): 85 U/L (ref 37–153)
BUN: 17 mg/dL (ref 7–25)
CO2: 29 mmol/L (ref 20–32)
Calcium: 9.6 mg/dL (ref 8.6–10.4)
Chloride: 105 mmol/L (ref 98–110)
Creat: 0.85 mg/dL (ref 0.50–1.05)
GFR, Est African American: 90 mL/min/{1.73_m2} (ref >=60)
GFR, Est Non African American: 78 mL/min/{1.73_m2} (ref >=60)
Globulin: 2.5 g/dL (calc) (ref 1.9–3.7)
Glucose, Bld: 88 mg/dL (ref 65–99)
Potassium: 5.5 mmol/L — ABNORMAL HIGH (ref 3.5–5.3)
Sodium: 142 mmol/L (ref 135–146)
Total Bilirubin: 0.4 mg/dL (ref 0.2–1.2)
Total Protein: 6.7 g/dL (ref 6.1–8.1)

## 2019-04-03 NOTE — Progress Notes (Signed)
Sundeep,   Cholesterol looks great. Kidney and liver look good. Potassium just a tad high. Are you taking any potassium supplements?

## 2019-04-04 NOTE — Progress Notes (Signed)
Its just a hair elevated. I saw heparin shot on med list are you still on those?  Are you taking any OTC NSAIDs.   Decrease foods with potassium bananas, spinach, potatoes, broccoli. Recheck in 1 week.

## 2019-04-05 ENCOUNTER — Other Ambulatory Visit: Payer: Self-pay | Admitting: Neurology

## 2019-04-05 DIAGNOSIS — E875 Hyperkalemia: Secondary | ICD-10-CM

## 2019-04-09 DIAGNOSIS — Z8719 Personal history of other diseases of the digestive system: Secondary | ICD-10-CM | POA: Insufficient documentation

## 2019-04-09 DIAGNOSIS — G2581 Restless legs syndrome: Secondary | ICD-10-CM | POA: Insufficient documentation

## 2019-04-09 DIAGNOSIS — Z8711 Personal history of peptic ulcer disease: Secondary | ICD-10-CM | POA: Insufficient documentation

## 2019-04-11 DIAGNOSIS — E875 Hyperkalemia: Secondary | ICD-10-CM | POA: Diagnosis not present

## 2019-04-12 LAB — COMPLETE METABOLIC PANEL WITH GFR
AG Ratio: 1.8 (calc) (ref 1.0–2.5)
ALT: 20 U/L (ref 6–29)
AST: 22 U/L (ref 10–35)
Albumin: 4.4 g/dL (ref 3.6–5.1)
Alkaline phosphatase (APISO): 85 U/L (ref 37–153)
BUN: 16 mg/dL (ref 7–25)
CO2: 26 mmol/L (ref 20–32)
Calcium: 9.4 mg/dL (ref 8.6–10.4)
Chloride: 103 mmol/L (ref 98–110)
Creat: 0.89 mg/dL (ref 0.50–1.05)
GFR, Est African American: 85 mL/min/{1.73_m2} (ref >=60)
GFR, Est Non African American: 73 mL/min/{1.73_m2} (ref >=60)
Globulin: 2.4 g/dL (calc) (ref 1.9–3.7)
Glucose, Bld: 103 mg/dL — ABNORMAL HIGH (ref 65–99)
Potassium: 4.3 mmol/L (ref 3.5–5.3)
Sodium: 139 mmol/L (ref 135–146)
Total Bilirubin: 0.5 mg/dL (ref 0.2–1.2)
Total Protein: 6.8 g/dL (ref 6.1–8.1)

## 2019-04-12 NOTE — Progress Notes (Signed)
Gray,   Potassium perfect!

## 2019-04-17 DIAGNOSIS — Z79899 Other long term (current) drug therapy: Secondary | ICD-10-CM | POA: Diagnosis not present

## 2019-04-17 DIAGNOSIS — M545 Low back pain: Secondary | ICD-10-CM | POA: Diagnosis not present

## 2019-04-17 DIAGNOSIS — Z79891 Long term (current) use of opiate analgesic: Secondary | ICD-10-CM | POA: Diagnosis not present

## 2019-04-17 DIAGNOSIS — R69 Illness, unspecified: Secondary | ICD-10-CM | POA: Diagnosis not present

## 2019-05-16 DIAGNOSIS — Z79899 Other long term (current) drug therapy: Secondary | ICD-10-CM | POA: Diagnosis not present

## 2019-05-16 DIAGNOSIS — Z79891 Long term (current) use of opiate analgesic: Secondary | ICD-10-CM | POA: Diagnosis not present

## 2019-05-16 DIAGNOSIS — R69 Illness, unspecified: Secondary | ICD-10-CM | POA: Diagnosis not present

## 2019-05-18 DIAGNOSIS — R339 Retention of urine, unspecified: Secondary | ICD-10-CM | POA: Diagnosis not present

## 2019-05-31 DIAGNOSIS — R69 Illness, unspecified: Secondary | ICD-10-CM | POA: Diagnosis not present

## 2019-06-12 DIAGNOSIS — N3010 Interstitial cystitis (chronic) without hematuria: Secondary | ICD-10-CM | POA: Diagnosis not present

## 2019-06-13 DIAGNOSIS — Z79899 Other long term (current) drug therapy: Secondary | ICD-10-CM | POA: Diagnosis not present

## 2019-06-13 DIAGNOSIS — Z79891 Long term (current) use of opiate analgesic: Secondary | ICD-10-CM | POA: Diagnosis not present

## 2019-06-13 DIAGNOSIS — R69 Illness, unspecified: Secondary | ICD-10-CM | POA: Diagnosis not present

## 2019-06-26 ENCOUNTER — Ambulatory Visit (INDEPENDENT_AMBULATORY_CARE_PROVIDER_SITE_OTHER): Payer: PPO | Admitting: Physician Assistant

## 2019-06-26 ENCOUNTER — Other Ambulatory Visit: Payer: Self-pay

## 2019-06-26 DIAGNOSIS — R21 Rash and other nonspecific skin eruption: Secondary | ICD-10-CM | POA: Diagnosis not present

## 2019-06-26 MED ORDER — CLOTRIMAZOLE-BETAMETHASONE 1-0.05 % EX CREA: 1.0000 "application " | TOPICAL_CREAM | Freq: Two times a day (BID) | CUTANEOUS | 0 refills | Status: DC

## 2019-06-26 NOTE — Progress Notes (Signed)
Subjective:    Patient ID: [name], female    DOB: [birth year], 54 y.o.   MRN: [id number]  HPI  Pt is a 54 yo who presents to the clinic with a rash on her left upper extremity for the last week.  She first noticed that on her inner left upper arm that was red and itchy.  It seemed to spread a little bit into her armpit and into her antecubital space.  It is slightly scaly on top of a red rash.  She denies any new medications, lotions, creams, exposures.  She had a similar rash like this in 2019 and used a Lotrisone cream which helped and has not been back since.  .. Active Ambulatory Problems    Diagnosis Date Noted  . TOBACCO ABUSE 05/28/2009  . MITRAL VALVE PROLAPSE 05/28/2009  . UNSPECIFIED CARDIAC DYSRHYTHMIA 05/16/2009  . TMJ PAIN 07/26/2008  . GASTRIC ULCER 05/26/2009  . NEPHROLITHIASIS 03/06/2010  . HEMATURIA UNSPECIFIED 12/19/2008  . OVARIAN CYST 03/06/2010  . PALPITATIONS 05/16/2009  . HEART MURMUR, HX OF 05/26/2009  . Depressive disorder, not elsewhere classified 11/06/2010  . Lymphocytic colitis 12/01/2011  . EBV infection 04/21/2012  . Chronic pain syndrome 06/21/2012  . Fibromyalgia 06/21/2012  . Carpal tunnel syndrome, bilateral 06/21/2012  . Degenerative disc disease, cervical 06/21/2012  . Facet arthropathy, cervical 06/21/2012  . DDD (degenerative disc disease), lumbosacral 06/21/2012  . Lower abdominal pain 07/26/2012  . Interstitial cystitis (chronic) with hematuria 04/13/2013  . Primary osteoarthritis of right knee 12/20/2013  . Incontinence in female 06/26/2014  . Chronic bilateral lower abdominal pain 06/26/2014  . Pain in joint, ankle and foot 04/21/2015  . Status post implantation of urinary electronic stimulator device 05/05/2015  . Post-menopausal 06/20/2015  . Family history of cardiovascular disease 06/20/2015  . Constipation 06/20/2015  . No energy 06/20/2015  . Dysuria 06/20/2015  . Hiatal hernia 07/16/2015  . Depression 09/03/2015   . Primary osteoarthritis of left hand 02/27/2016  . Breast pain, right 09/14/2016  . Abnormal weight gain 03/16/2017  . BMI 27.0-27.9,adult 03/16/2017  . History of gastric ulcer 04/09/2019  . RLS (restless legs syndrome) 04/09/2019   Resolved Ambulatory Problems    Diagnosis Date Noted  . Other change of lacrimal passages 03/01/2010  . URI 08/17/2008  . STONE, URINARY CALCULUS,UNSPEC. 12/29/2008  . FOOT PAIN, LEFT 04/14/2010  . Diarrhea 11/04/2009  . CONTUSION, LEFT HAND 05/10/2009  . CHEMICAL BURN 04/14/2010  . Nephrolithiasis 11/06/2010  . Bacterial folliculitis [id number]  . Acute pharyngitis 01/30/2011  . BRONCHITIS, ACUTE 02/04/2011  . ABDOMINAL PAIN, ACUTE 02/04/2011   Past Medical History:  Diagnosis Date  . Anxiety   . Cancer ([place])   . Colitis   . Dysrhythmia   . Gastric ulcer   . Headache(784.0)   . Heart murmur   . IBS (irritable bowel syndrome)   . Interstitial cystitis   . MVP (mitral valve prolapse)   . Urinary calculus or stone       Review of Systems See HPI.     Objective:   Physical Exam Skin:    Comments: Erythematous macular rash on medial left upper arm and axilla and erythematous raised linear rash in left antecubital space with some fine scales. No vesicles.            Assessment & Plan:  [name] KitchenMarland KitchenKorinn was seen today for rash.  Diagnoses and all orders for this visit:  Rash and nonspecific skin eruption -  clotrimazole-betamethasone (LOTRISONE) cream; Apply 1 application topically 2 (two) times daily.   Exact etiology of rash is unclear.  It is likely some type of dermatitis.  Unknown trigger.  Sent Lotrisone cream that she is used before.  Follow-up if not improving in the next 2 weeks.

## 2019-06-27 ENCOUNTER — Encounter: Payer: Self-pay | Admitting: Physician Assistant

## 2019-07-06 DIAGNOSIS — Z20822 Contact with and (suspected) exposure to covid-19: Secondary | ICD-10-CM | POA: Diagnosis not present

## 2019-07-06 DIAGNOSIS — Z01812 Encounter for preprocedural laboratory examination: Secondary | ICD-10-CM | POA: Diagnosis not present

## 2019-07-06 DIAGNOSIS — N3010 Interstitial cystitis (chronic) without hematuria: Secondary | ICD-10-CM | POA: Diagnosis not present

## 2019-07-13 DIAGNOSIS — T83110A Breakdown (mechanical) of urinary electronic stimulator device, initial encounter: Secondary | ICD-10-CM | POA: Diagnosis not present

## 2019-07-13 DIAGNOSIS — R350 Frequency of micturition: Secondary | ICD-10-CM | POA: Diagnosis not present

## 2019-07-13 DIAGNOSIS — Z466 Encounter for fitting and adjustment of urinary device: Secondary | ICD-10-CM | POA: Diagnosis not present

## 2019-07-13 DIAGNOSIS — R3915 Urgency of urination: Secondary | ICD-10-CM | POA: Diagnosis not present

## 2019-07-16 DIAGNOSIS — M545 Low back pain: Secondary | ICD-10-CM | POA: Diagnosis not present

## 2019-07-16 DIAGNOSIS — F17210 Nicotine dependence, cigarettes, uncomplicated: Secondary | ICD-10-CM | POA: Diagnosis not present

## 2019-07-16 DIAGNOSIS — Z79899 Other long term (current) drug therapy: Secondary | ICD-10-CM | POA: Diagnosis not present

## 2019-07-16 DIAGNOSIS — Z79891 Long term (current) use of opiate analgesic: Secondary | ICD-10-CM | POA: Diagnosis not present

## 2019-07-16 DIAGNOSIS — F1120 Opioid dependence, uncomplicated: Secondary | ICD-10-CM | POA: Diagnosis not present

## 2019-08-13 DIAGNOSIS — Z79891 Long term (current) use of opiate analgesic: Secondary | ICD-10-CM | POA: Diagnosis not present

## 2019-08-13 DIAGNOSIS — M545 Low back pain: Secondary | ICD-10-CM | POA: Diagnosis not present

## 2019-08-13 DIAGNOSIS — F1120 Opioid dependence, uncomplicated: Secondary | ICD-10-CM | POA: Diagnosis not present

## 2019-08-13 DIAGNOSIS — F17210 Nicotine dependence, cigarettes, uncomplicated: Secondary | ICD-10-CM | POA: Diagnosis not present

## 2019-08-13 DIAGNOSIS — Z20822 Contact with and (suspected) exposure to covid-19: Secondary | ICD-10-CM | POA: Diagnosis not present

## 2019-08-16 DIAGNOSIS — R339 Retention of urine, unspecified: Secondary | ICD-10-CM | POA: Diagnosis not present

## 2019-09-11 DIAGNOSIS — Z79899 Other long term (current) drug therapy: Secondary | ICD-10-CM | POA: Diagnosis not present

## 2019-09-11 DIAGNOSIS — Z79891 Long term (current) use of opiate analgesic: Secondary | ICD-10-CM | POA: Diagnosis not present

## 2019-09-11 DIAGNOSIS — F1120 Opioid dependence, uncomplicated: Secondary | ICD-10-CM | POA: Diagnosis not present

## 2019-09-11 DIAGNOSIS — M545 Low back pain: Secondary | ICD-10-CM | POA: Diagnosis not present

## 2019-09-11 DIAGNOSIS — F17210 Nicotine dependence, cigarettes, uncomplicated: Secondary | ICD-10-CM | POA: Diagnosis not present

## 2019-10-12 DIAGNOSIS — Z79899 Other long term (current) drug therapy: Secondary | ICD-10-CM | POA: Diagnosis not present

## 2019-10-12 DIAGNOSIS — M545 Low back pain: Secondary | ICD-10-CM | POA: Diagnosis not present

## 2019-10-12 DIAGNOSIS — N3010 Interstitial cystitis (chronic) without hematuria: Secondary | ICD-10-CM | POA: Diagnosis not present

## 2019-10-12 DIAGNOSIS — Z79891 Long term (current) use of opiate analgesic: Secondary | ICD-10-CM | POA: Diagnosis not present

## 2019-10-29 ENCOUNTER — Other Ambulatory Visit: Payer: Self-pay | Admitting: Neurology

## 2019-10-29 DIAGNOSIS — G2581 Restless legs syndrome: Secondary | ICD-10-CM

## 2019-10-29 DIAGNOSIS — F330 Major depressive disorder, recurrent, mild: Secondary | ICD-10-CM

## 2019-10-29 DIAGNOSIS — K449 Diaphragmatic hernia without obstruction or gangrene: Secondary | ICD-10-CM

## 2019-10-29 DIAGNOSIS — Z8711 Personal history of peptic ulcer disease: Secondary | ICD-10-CM

## 2019-10-29 MED ORDER — PANTOPRAZOLE SODIUM 40 MG PO TBEC: 40.0000 mg | DELAYED_RELEASE_TABLET | Freq: Every day | ORAL | 1 refills | Status: DC

## 2019-10-29 MED ORDER — DULOXETINE HCL 30 MG PO CPEP: 60.0000 mg | ORAL_CAPSULE | Freq: Every day | ORAL | 1 refills | Status: DC

## 2019-10-29 MED ORDER — PRAMIPEXOLE DIHYDROCHLORIDE 0.125 MG PO TABS: ORAL_TABLET | ORAL | 1 refills | Status: DC

## 2019-11-08 DIAGNOSIS — Z79891 Long term (current) use of opiate analgesic: Secondary | ICD-10-CM | POA: Diagnosis not present

## 2019-11-08 DIAGNOSIS — M545 Low back pain: Secondary | ICD-10-CM | POA: Diagnosis not present

## 2019-11-08 DIAGNOSIS — Z79899 Other long term (current) drug therapy: Secondary | ICD-10-CM | POA: Diagnosis not present

## 2019-11-08 DIAGNOSIS — F1120 Opioid dependence, uncomplicated: Secondary | ICD-10-CM | POA: Diagnosis not present

## 2019-11-15 DIAGNOSIS — R339 Retention of urine, unspecified: Secondary | ICD-10-CM | POA: Diagnosis not present

## 2019-11-18 DIAGNOSIS — R339 Retention of urine, unspecified: Secondary | ICD-10-CM | POA: Diagnosis not present

## 2019-11-20 DIAGNOSIS — R339 Retention of urine, unspecified: Secondary | ICD-10-CM | POA: Diagnosis not present

## 2019-12-10 DIAGNOSIS — F1120 Opioid dependence, uncomplicated: Secondary | ICD-10-CM | POA: Diagnosis not present

## 2019-12-10 DIAGNOSIS — Z79891 Long term (current) use of opiate analgesic: Secondary | ICD-10-CM | POA: Diagnosis not present

## 2019-12-10 DIAGNOSIS — Z79899 Other long term (current) drug therapy: Secondary | ICD-10-CM | POA: Diagnosis not present

## 2019-12-10 DIAGNOSIS — F17210 Nicotine dependence, cigarettes, uncomplicated: Secondary | ICD-10-CM | POA: Diagnosis not present

## 2019-12-10 DIAGNOSIS — M545 Low back pain: Secondary | ICD-10-CM | POA: Diagnosis not present

## 2020-01-07 DIAGNOSIS — Z79899 Other long term (current) drug therapy: Secondary | ICD-10-CM | POA: Diagnosis not present

## 2020-01-07 DIAGNOSIS — F17210 Nicotine dependence, cigarettes, uncomplicated: Secondary | ICD-10-CM | POA: Diagnosis not present

## 2020-01-07 DIAGNOSIS — F1120 Opioid dependence, uncomplicated: Secondary | ICD-10-CM | POA: Diagnosis not present

## 2020-01-07 DIAGNOSIS — M545 Low back pain: Secondary | ICD-10-CM | POA: Diagnosis not present

## 2020-02-07 DIAGNOSIS — Z131 Encounter for screening for diabetes mellitus: Secondary | ICD-10-CM | POA: Diagnosis not present

## 2020-02-07 DIAGNOSIS — E559 Vitamin D deficiency, unspecified: Secondary | ICD-10-CM | POA: Diagnosis not present

## 2020-02-07 DIAGNOSIS — F17210 Nicotine dependence, cigarettes, uncomplicated: Secondary | ICD-10-CM | POA: Diagnosis not present

## 2020-02-07 DIAGNOSIS — Z79899 Other long term (current) drug therapy: Secondary | ICD-10-CM | POA: Diagnosis not present

## 2020-02-07 DIAGNOSIS — R5383 Other fatigue: Secondary | ICD-10-CM | POA: Diagnosis not present

## 2020-02-07 DIAGNOSIS — E7800 Pure hypercholesterolemia, unspecified: Secondary | ICD-10-CM | POA: Diagnosis not present

## 2020-02-07 DIAGNOSIS — M545 Low back pain: Secondary | ICD-10-CM | POA: Diagnosis not present

## 2020-02-07 DIAGNOSIS — Z20822 Contact with and (suspected) exposure to covid-19: Secondary | ICD-10-CM | POA: Diagnosis not present

## 2020-02-12 ENCOUNTER — Other Ambulatory Visit: Payer: Self-pay | Admitting: Neurology

## 2020-02-12 DIAGNOSIS — R110 Nausea: Secondary | ICD-10-CM

## 2020-02-12 MED ORDER — PROMETHAZINE HCL 25 MG PO TABS: ORAL_TABLET | ORAL | 1 refills | Status: DC

## 2020-02-15 ENCOUNTER — Other Ambulatory Visit: Payer: Self-pay | Admitting: Neurology

## 2020-02-15 DIAGNOSIS — R110 Nausea: Secondary | ICD-10-CM

## 2020-02-15 NOTE — Telephone Encounter (Signed)
[facility] called and need more specific instructions on phenergan RX. It states to take one as needed, but they need to know if this is daily or how often? And has to have a 90 day supply. Please advise.

## 2020-02-19 MED ORDER — PROMETHAZINE HCL 25 MG PO TABS: ORAL_TABLET | ORAL | 1 refills | Status: DC

## 2020-03-06 ENCOUNTER — Ambulatory Visit (INDEPENDENT_AMBULATORY_CARE_PROVIDER_SITE_OTHER): Payer: PPO

## 2020-03-06 ENCOUNTER — Other Ambulatory Visit: Payer: Self-pay | Admitting: Family Medicine

## 2020-03-06 ENCOUNTER — Other Ambulatory Visit: Payer: Self-pay

## 2020-03-06 DIAGNOSIS — M25571 Pain in right ankle and joints of right foot: Secondary | ICD-10-CM | POA: Diagnosis not present

## 2020-03-06 DIAGNOSIS — M79671 Pain in right foot: Secondary | ICD-10-CM | POA: Diagnosis not present

## 2020-03-07 ENCOUNTER — Other Ambulatory Visit: Payer: Self-pay | Admitting: Neurology

## 2020-03-07 DIAGNOSIS — F17210 Nicotine dependence, cigarettes, uncomplicated: Secondary | ICD-10-CM | POA: Diagnosis not present

## 2020-03-07 DIAGNOSIS — F330 Major depressive disorder, recurrent, mild: Secondary | ICD-10-CM

## 2020-03-07 DIAGNOSIS — Z8711 Personal history of peptic ulcer disease: Secondary | ICD-10-CM

## 2020-03-07 DIAGNOSIS — F1120 Opioid dependence, uncomplicated: Secondary | ICD-10-CM | POA: Diagnosis not present

## 2020-03-07 DIAGNOSIS — K449 Diaphragmatic hernia without obstruction or gangrene: Secondary | ICD-10-CM

## 2020-03-07 DIAGNOSIS — M545 Low back pain: Secondary | ICD-10-CM | POA: Diagnosis not present

## 2020-03-07 DIAGNOSIS — Z79899 Other long term (current) drug therapy: Secondary | ICD-10-CM | POA: Diagnosis not present

## 2020-03-07 MED ORDER — DULOXETINE HCL 30 MG PO CPEP: 60.0000 mg | ORAL_CAPSULE | Freq: Every day | ORAL | 0 refills | Status: DC

## 2020-03-07 MED ORDER — PANTOPRAZOLE SODIUM 40 MG PO TBEC: 40.0000 mg | DELAYED_RELEASE_TABLET | Freq: Every day | ORAL | 0 refills | Status: DC

## 2020-04-04 DIAGNOSIS — Z79891 Long term (current) use of opiate analgesic: Secondary | ICD-10-CM | POA: Diagnosis not present

## 2020-04-04 DIAGNOSIS — Z79899 Other long term (current) drug therapy: Secondary | ICD-10-CM | POA: Diagnosis not present

## 2020-04-04 DIAGNOSIS — F1120 Opioid dependence, uncomplicated: Secondary | ICD-10-CM | POA: Diagnosis not present

## 2020-04-04 DIAGNOSIS — F17210 Nicotine dependence, cigarettes, uncomplicated: Secondary | ICD-10-CM | POA: Diagnosis not present

## 2020-04-04 DIAGNOSIS — M545 Low back pain, unspecified: Secondary | ICD-10-CM | POA: Diagnosis not present

## 2020-04-16 ENCOUNTER — Other Ambulatory Visit: Payer: Self-pay | Admitting: Neurology

## 2020-04-16 DIAGNOSIS — G2581 Restless legs syndrome: Secondary | ICD-10-CM

## 2020-04-16 MED ORDER — PRAMIPEXOLE DIHYDROCHLORIDE 0.125 MG PO TABS: ORAL_TABLET | ORAL | 1 refills | Status: DC

## 2020-05-02 DIAGNOSIS — M5459 Other low back pain: Secondary | ICD-10-CM | POA: Diagnosis not present

## 2020-05-02 DIAGNOSIS — Z79891 Long term (current) use of opiate analgesic: Secondary | ICD-10-CM | POA: Diagnosis not present

## 2020-05-02 DIAGNOSIS — F1120 Opioid dependence, uncomplicated: Secondary | ICD-10-CM | POA: Diagnosis not present

## 2020-05-02 DIAGNOSIS — F17210 Nicotine dependence, cigarettes, uncomplicated: Secondary | ICD-10-CM | POA: Diagnosis not present

## 2020-05-20 ENCOUNTER — Other Ambulatory Visit: Payer: Self-pay | Admitting: Neurology

## 2020-05-20 DIAGNOSIS — F330 Major depressive disorder, recurrent, mild: Secondary | ICD-10-CM

## 2020-05-20 MED ORDER — DULOXETINE HCL 30 MG PO CPEP: 60.0000 mg | ORAL_CAPSULE | Freq: Every day | ORAL | 0 refills | Status: DC

## 2020-05-29 DIAGNOSIS — Z79891 Long term (current) use of opiate analgesic: Secondary | ICD-10-CM | POA: Diagnosis not present

## 2020-05-29 DIAGNOSIS — F1120 Opioid dependence, uncomplicated: Secondary | ICD-10-CM | POA: Diagnosis not present

## 2020-05-29 DIAGNOSIS — F17210 Nicotine dependence, cigarettes, uncomplicated: Secondary | ICD-10-CM | POA: Diagnosis not present

## 2020-05-29 DIAGNOSIS — M5459 Other low back pain: Secondary | ICD-10-CM | POA: Diagnosis not present

## 2020-06-26 ENCOUNTER — Other Ambulatory Visit: Payer: Self-pay

## 2020-06-26 DIAGNOSIS — N390 Urinary tract infection, site not specified: Secondary | ICD-10-CM | POA: Diagnosis not present

## 2020-06-26 DIAGNOSIS — R300 Dysuria: Secondary | ICD-10-CM | POA: Diagnosis not present

## 2020-06-26 DIAGNOSIS — Z8711 Personal history of peptic ulcer disease: Secondary | ICD-10-CM

## 2020-06-26 DIAGNOSIS — F17210 Nicotine dependence, cigarettes, uncomplicated: Secondary | ICD-10-CM | POA: Diagnosis not present

## 2020-06-26 DIAGNOSIS — A499 Bacterial infection, unspecified: Secondary | ICD-10-CM | POA: Diagnosis not present

## 2020-06-26 DIAGNOSIS — K449 Diaphragmatic hernia without obstruction or gangrene: Secondary | ICD-10-CM

## 2020-06-26 DIAGNOSIS — Z79899 Other long term (current) drug therapy: Secondary | ICD-10-CM | POA: Diagnosis not present

## 2020-06-26 DIAGNOSIS — M5459 Other low back pain: Secondary | ICD-10-CM | POA: Diagnosis not present

## 2020-06-26 MED ORDER — PANTOPRAZOLE SODIUM 40 MG PO TBEC: 40.0000 mg | DELAYED_RELEASE_TABLET | Freq: Every day | ORAL | 0 refills | Status: DC

## 2020-07-14 ENCOUNTER — Telehealth: Payer: Self-pay | Admitting: General Practice

## 2020-08-01 NOTE — Telephone Encounter (Signed)
Documentation only.

## 2020-08-07 DIAGNOSIS — M5459 Other low back pain: Secondary | ICD-10-CM | POA: Diagnosis not present

## 2020-08-07 DIAGNOSIS — Z79899 Other long term (current) drug therapy: Secondary | ICD-10-CM | POA: Diagnosis not present

## 2020-08-07 DIAGNOSIS — F1120 Opioid dependence, uncomplicated: Secondary | ICD-10-CM | POA: Diagnosis not present

## 2020-08-07 DIAGNOSIS — F17210 Nicotine dependence, cigarettes, uncomplicated: Secondary | ICD-10-CM | POA: Diagnosis not present

## 2020-08-07 DIAGNOSIS — Z79891 Long term (current) use of opiate analgesic: Secondary | ICD-10-CM | POA: Diagnosis not present

## 2020-08-20 ENCOUNTER — Ambulatory Visit (INDEPENDENT_AMBULATORY_CARE_PROVIDER_SITE_OTHER): Payer: PPO | Admitting: Physician Assistant

## 2020-08-20 DIAGNOSIS — Z131 Encounter for screening for diabetes mellitus: Secondary | ICD-10-CM

## 2020-08-20 DIAGNOSIS — Z5329 Procedure and treatment not carried out because of patient's decision for other reasons: Secondary | ICD-10-CM

## 2020-08-20 DIAGNOSIS — Z13220 Encounter for screening for lipoid disorders: Secondary | ICD-10-CM

## 2020-08-20 DIAGNOSIS — Z1329 Encounter for screening for other suspected endocrine disorder: Secondary | ICD-10-CM

## 2020-08-20 DIAGNOSIS — Z0000 Encounter for general adult medical examination without abnormal findings: Secondary | ICD-10-CM

## 2020-08-20 NOTE — Progress Notes (Signed)
No show

## 2020-08-29 ENCOUNTER — Ambulatory Visit (INDEPENDENT_AMBULATORY_CARE_PROVIDER_SITE_OTHER): Payer: PPO | Admitting: Physician Assistant

## 2020-08-29 ENCOUNTER — Other Ambulatory Visit: Payer: Self-pay

## 2020-08-29 VITALS — BP 157/98 | HR 105 | Ht 66.0 in | Wt 179.0 lb

## 2020-08-29 DIAGNOSIS — F330 Major depressive disorder, recurrent, mild: Secondary | ICD-10-CM | POA: Diagnosis not present

## 2020-08-29 DIAGNOSIS — K5903 Drug induced constipation: Secondary | ICD-10-CM

## 2020-08-29 DIAGNOSIS — Z0000 Encounter for general adult medical examination without abnormal findings: Secondary | ICD-10-CM

## 2020-08-29 DIAGNOSIS — Z13220 Encounter for screening for lipoid disorders: Secondary | ICD-10-CM

## 2020-08-29 DIAGNOSIS — Z1329 Encounter for screening for other suspected endocrine disorder: Secondary | ICD-10-CM

## 2020-08-29 DIAGNOSIS — Z131 Encounter for screening for diabetes mellitus: Secondary | ICD-10-CM | POA: Diagnosis not present

## 2020-08-29 DIAGNOSIS — G2581 Restless legs syndrome: Secondary | ICD-10-CM

## 2020-08-29 DIAGNOSIS — Z8711 Personal history of peptic ulcer disease: Secondary | ICD-10-CM

## 2020-08-29 DIAGNOSIS — Z1159 Encounter for screening for other viral diseases: Secondary | ICD-10-CM | POA: Diagnosis not present

## 2020-08-29 DIAGNOSIS — K449 Diaphragmatic hernia without obstruction or gangrene: Secondary | ICD-10-CM | POA: Diagnosis not present

## 2020-08-29 DIAGNOSIS — Z1231 Encounter for screening mammogram for malignant neoplasm of breast: Secondary | ICD-10-CM | POA: Diagnosis not present

## 2020-08-29 MED ORDER — LINACLOTIDE 290 MCG PO CAPS: 290.0000 ug | ORAL_CAPSULE | Freq: Every day | ORAL | 1 refills | Status: DC

## 2020-08-29 MED ORDER — PRAMIPEXOLE DIHYDROCHLORIDE 0.125 MG PO TABS: ORAL_TABLET | ORAL | 1 refills | Status: DC

## 2020-08-29 MED ORDER — PANTOPRAZOLE SODIUM 40 MG PO TBEC: 40.0000 mg | DELAYED_RELEASE_TABLET | Freq: Every day | ORAL | 3 refills | Status: DC

## 2020-08-29 MED ORDER — DULOXETINE HCL 30 MG PO CPEP: 90.0000 mg | ORAL_CAPSULE | Freq: Every day | ORAL | 1 refills | Status: DC

## 2020-08-29 NOTE — Patient Instructions (Signed)
Health Maintenance, Female Adopting a healthy lifestyle and getting preventive care are important in promoting health and wellness. Ask your health care provider about:  The right schedule for you to have regular tests and exams.  Things you can do on your own to prevent diseases and keep yourself healthy. What should I know about diet, weight, and exercise? Eat a healthy diet  Eat a diet that includes plenty of vegetables, fruits, low-fat dairy products, and lean protein.  Do not eat a lot of foods that are high in solid fats, added sugars, or sodium.   Maintain a healthy weight Body mass index (BMI) is used to identify weight problems. It estimates body fat based on height and weight. Your health care provider can help determine your BMI and help you achieve or maintain a healthy weight. Get regular exercise Get regular exercise. This is one of the most important things you can do for your health. Most adults should:  Exercise for at least 150 minutes each week. The exercise should increase your heart rate and make you sweat (moderate-intensity exercise).  Do strengthening exercises at least twice a week. This is in addition to the moderate-intensity exercise.  Spend less time sitting. Even light physical activity can be beneficial. Watch cholesterol and blood lipids Have your blood tested for lipids and cholesterol at 56 years of age, then have this test every 5 years. Have your cholesterol levels checked more often if:  Your lipid or cholesterol levels are high.  You are older than 56 years of age.  You are at high risk for heart disease. What should I know about cancer screening? Depending on your health history and family history, you may need to have cancer screening at various ages. This may include screening for:  Breast cancer.  Cervical cancer.  Colorectal cancer.  Skin cancer.  Lung cancer. What should I know about heart disease, diabetes, and high blood  pressure? Blood pressure and heart disease  High blood pressure causes heart disease and increases the risk of stroke. This is more likely to develop in people who have high blood pressure readings, are of African descent, or are overweight.  Have your blood pressure checked: ? Every 3-5 years if you are 18-39 years of age. ? Every year if you are 40 years old or older. Diabetes Have regular diabetes screenings. This checks your fasting blood sugar level. Have the screening done:  Once every three years after age 40 if you are at a normal weight and have a low risk for diabetes.  More often and at a younger age if you are overweight or have a high risk for diabetes. What should I know about preventing infection? Hepatitis B If you have a higher risk for hepatitis B, you should be screened for this virus. Talk with your health care provider to find out if you are at risk for hepatitis B infection. Hepatitis C Testing is recommended for:  Everyone born from 1945 through 1965.  Anyone with known risk factors for hepatitis C. Sexually transmitted infections (STIs)  Get screened for STIs, including gonorrhea and chlamydia, if: ? You are sexually active and are younger than 56 years of age. ? You are older than 56 years of age and your health care provider tells you that you are at risk for this type of infection. ? Your sexual activity has changed since you were last screened, and you are at increased risk for chlamydia or gonorrhea. Ask your health care provider   if you are at risk.  Ask your health care provider about whether you are at high risk for HIV. Your health care provider may recommend a prescription medicine to help prevent HIV infection. If you choose to take medicine to prevent HIV, you should first get tested for HIV. You should then be tested every 3 months for as long as you are taking the medicine. Pregnancy  If you are about to stop having your period (premenopausal) and  you may become pregnant, seek counseling before you get pregnant.  Take 400 to 800 micrograms (mcg) of folic acid every day if you become pregnant.  Ask for birth control (contraception) if you want to prevent pregnancy. Osteoporosis and menopause Osteoporosis is a disease in which the bones lose minerals and strength with aging. This can result in bone fractures. If you are 65 years old or older, or if you are at risk for osteoporosis and fractures, ask your health care provider if you should:  Be screened for bone loss.  Take a calcium or vitamin D supplement to lower your risk of fractures.  Be given hormone replacement therapy (HRT) to treat symptoms of menopause. Follow these instructions at home: Lifestyle  Do not use any products that contain nicotine or tobacco, such as cigarettes, e-cigarettes, and chewing tobacco. If you need help quitting, ask your health care provider.  Do not use street drugs.  Do not share needles.  Ask your health care provider for help if you need support or information about quitting drugs. Alcohol use  Do not drink alcohol if: ? Your health care provider tells you not to drink. ? You are pregnant, may be pregnant, or are planning to become pregnant.  If you drink alcohol: ? Limit how much you use to 0-1 drink a day. ? Limit intake if you are breastfeeding.  Be aware of how much alcohol is in your drink. In the [place] one drink equals one 12 oz bottle of beer (355 mL), one 5 oz glass of wine (148 mL), or one 1 oz glass of hard liquor (44 mL). General instructions  Schedule regular health, dental, and eye exams.  Stay current with your vaccines.  Tell your health care provider if: ? You often feel depressed. ? You have ever been abused or do not feel safe at home. Summary  Adopting a healthy lifestyle and getting preventive care are important in promoting health and wellness.  Follow your health care provider's instructions about healthy  diet, exercising, and getting tested or screened for diseases.  Follow your health care provider's instructions on monitoring your cholesterol and blood pressure. This information is not intended to replace advice given to you by your health care provider. Make sure you discuss any questions you have with your health care provider. Document Revised: 05/24/2018 Document Reviewed: 05/24/2018 Elsevier Patient Education  2021 Elsevier Inc.  

## 2020-08-29 NOTE — Progress Notes (Signed)
Subjective:     [name] is a 55 y.o. female and is here for a comprehensive physical exam. The patient reports problems - ongoing pain and depression. no SI/HC. no new issues.  Social History   Socioeconomic History  . Marital status: Married    Spouse name: Not on file  . Number of children: 2  . Years of education: Not on file  . Highest education level: Not on file  Occupational History  . Not on file  Tobacco Use  . Smoking status: Current Every Day Smoker    Packs/day: 0.50    Years: 25.00    Pack years: 12.50    Types: Cigarettes  . Smokeless tobacco: Never Used  Substance and Sexual Activity  . Alcohol use: Yes    Comment: rarely  . Drug use: No  . Sexual activity: Yes    Partners: Male  Other Topics Concern  . Not on file  Social History Narrative  . Not on file   Social Determinants of Health   Financial Resource Strain: Not on file  Food Insecurity: Not on file  Transportation Needs: Not on file  Physical Activity: Not on file  Stress: Not on file  Social Connections: Not on file  Intimate Partner Violence: Not on file   Health Maintenance  Topic Date Due  . Hepatitis C Screening  Never done  . MAMMOGRAM  10/05/2019  . COVID-19 Vaccine (1) 09/14/2020 (Originally 11/23/1970)  . INFLUENZA VACCINE  10/15/2020 (Originally 01/13/2020)  . HIV Screening  04/18/2048 (Originally 11/24/1979)  . TETANUS/TDAP  09/07/2021  . COLONOSCOPY (Pts 45-43yrs Insurance coverage will need to be confirmed)  04/02/2025  . HPV VACCINES  Aged Out    The following portions of the patient's history were reviewed and updated as appropriate: allergies, current medications, past family history, past medical history, past social history, past surgical history and problem list.  Review of Systems Pertinent items noted in HPI and remainder of comprehensive ROS otherwise negative.   Objective:    BP (!) 157/98   Pulse (!) 105   Ht 5\' 6"  (1.676 m)   Wt 179 lb (81.2 kg)    SpO2 98%   BMI 28.89 kg/m  General appearance: alert, cooperative and appears stated age Head: Normocephalic, without obvious abnormality, atraumatic Eyes: conjunctivae/corneas clear. PERRL, EOM's intact. Fundi benign. Ears: normal TM's and external ear canals both ears Nose: Nares normal. Septum midline. Mucosa normal. No drainage or sinus tenderness. Throat: lips, mucosa, and tongue normal; teeth and gums normal Neck: no adenopathy, no carotid bruit, no JVD, supple, symmetrical, trachea midline and thyroid not enlarged, symmetric, no tenderness/mass/nodules Back: symmetric, no curvature. ROM normal. No CVA tenderness. Lungs: clear to auscultation bilaterally Heart: regular rate and rhythm, S1, S2 normal, no murmur, click, rub or gallop Extremities: extremities normal, atraumatic, no cyanosis or edema Pulses: 2+ and symmetric Skin: Skin color, texture, turgor normal. No rashes or lesions Lymph nodes: Cervical, supraclavicular, and axillary nodes normal. Neurologic: Alert and oriented X 3, normal strength and tone. Normal symmetric reflexes. Normal coordination and gait   .[name] Depression screen [facility] 2/9 08/29/2020 04/02/2019 04/22/2018 12/12/2017 03/16/2017  Decreased Interest 2 2 0 2 1  Down, Depressed, Hopeless 2 2 1 2 2   PHQ - 2 Score 4 4 1 4 3   Altered sleeping 1 2 1 1 1   Tired, decreased energy 1 1 1 1 1   Change in appetite 2 1 0 1 1  Feeling bad or failure about yourself  2 2 0 1 2  Trouble concentrating 1 0 0 0 1  Moving slowly or fidgety/restless 1 0 0 0 0  Suicidal thoughts 1 0 0 0 0  PHQ-9 Score 13 10 3 8 9   Difficult doing work/chores Somewhat difficult Very difficult - Somewhat difficult -   .. GAD 7 : Generalized Anxiety Score 08/29/2020 04/02/2019 04/22/2018 12/12/2017  Nervous, Anxious, on Edge 2 1 1  0  Control/stop worrying 2 2 1 1   Worry too much - different things 2 2 1 1   Trouble relaxing 1 1 1  -  Restless 1 1 1  0  Easily annoyed or irritable 2 2 1  0  Afraid - awful  might happen 2 1 0 0  Total GAD 7 Score 12 10 6  -  Anxiety Difficulty Somewhat difficult Somewhat difficult Somewhat difficult Somewhat difficult     Assessment:    Healthy female exam.      Plan:  [name] KitchenMarland KitchenAbby was seen today for annual exam.  Diagnoses and all orders for this visit:  Preventative health care -     CBC with Differential/Platelet -     COMPLETE METABOLIC PANEL WITH GFR -     Lipid Panel w/reflex Direct LDL -     TSH  Screening for lipid disorders -     Lipid Panel w/reflex Direct LDL  Screening for diabetes mellitus -     COMPLETE METABOLIC PANEL WITH GFR  Screening for thyroid disorder -     TSH  Encounter for screening mammogram for malignant neoplasm of breast -     MM 3D SCREEN BREAST BILATERAL  Mild episode of recurrent major depressive disorder (HCC) -     DULoxetine (CYMBALTA) 30 MG capsule; Take 3 capsules (90 mg total) by mouth daily.  RLS (restless legs syndrome) -     pramipexole (MIRAPEX) 0.125 MG tablet; TAKE 1 TABLET AT BEDTIME AS NEEDED FOR RESTLESS LEGS  Hiatal hernia -     pantoprazole (PROTONIX) 40 MG tablet; Take 1 tablet (40 mg total) by mouth daily.  History of gastric ulcer -     pantoprazole (PROTONIX) 40 MG tablet; Take 1 tablet (40 mg total) by mouth daily.  Encounter for hepatitis C screening test for low risk patient -     Hepatitis C Antibody  Drug induced constipation -     linaclotide (LINZESS) 290 MCG CAPS capsule; Take 1 capsule (290 mcg total) by mouth daily before breakfast.  Other orders -     Discontinue: linaclotide (LINZESS) 290 MCG CAPS capsule; Take 1 capsule (290 mcg total) by mouth daily before breakfast.   .. Discussed 150 minutes of exercise a week.  Encouraged vitamin D 1000 units and Calcium 1300mg  or 4 servings of dairy a day.  Fasting labs ordered.  Mammogram scheduled.  Bone density not needed at this time.  Per patient colonoscopy UTD. Declined flu/covid vaccines.  Encouraged shingrix  vaccines.  PHQ/GAD not to goal. Increased cymbalta to 3 capsules daily.   Discussed constipation likely from pain medication.  Start linzess.  Encouraged hydration and good fiber intake.   Continue with urology for management for IC symptoms.   BP not to goal. She will continue to monitor at home. Above 140/90 will reach back out and try lifestyle changes.   Smoking cessation declined.   Follow up in 6 months.      See After Visit Summary for Counseling Recommendations

## 2020-09-01 ENCOUNTER — Encounter: Payer: Self-pay | Admitting: Physician Assistant

## 2020-09-01 NOTE — Progress Notes (Signed)
Need to add ferritin/iron panel.

## 2020-09-01 NOTE — Progress Notes (Signed)
Hemoglobin is elevated.  Your smoking history was not updated at last visit. Do you still smoke?   Kidney, liver, glucose look great.   Cholesterol looks great.   Thyroid looks great.

## 2020-09-02 ENCOUNTER — Encounter: Payer: Self-pay | Admitting: Physician Assistant

## 2020-09-02 LAB — CBC WITH DIFFERENTIAL/PLATELET
Absolute Monocytes: 783 cells/uL (ref 200–950)
Basophils Absolute: 44 cells/uL (ref 0–200)
Basophils Relative: 0.5 %
Eosinophils Absolute: 211 cells/uL (ref 15–500)
Eosinophils Relative: 2.4 %
HCT: 48.4 % — ABNORMAL HIGH (ref 35.0–45.0)
Hemoglobin: 16.6 g/dL — ABNORMAL HIGH (ref 11.7–15.5)
Lymphs Abs: 2323 cells/uL (ref 850–3900)
MCH: 32 pg (ref 27.0–33.0)
MCHC: 34.3 g/dL (ref 32.0–36.0)
MCV: 93.3 fL (ref 80.0–100.0)
MPV: 9.8 fL (ref 7.5–12.5)
Monocytes Relative: 8.9 %
Neutro Abs: 5438 cells/uL (ref 1500–7800)
Neutrophils Relative %: 61.8 %
Platelets: 355 10*3/uL (ref 140–400)
RBC: 5.19 10*6/uL — ABNORMAL HIGH (ref 3.80–5.10)
RDW: 14.5 % (ref 11.0–15.0)
Total Lymphocyte: 26.4 %
WBC: 8.8 10*3/uL (ref 3.8–10.8)

## 2020-09-02 LAB — COMPLETE METABOLIC PANEL WITH GFR
AG Ratio: 1.6 (calc) (ref 1.0–2.5)
ALT: 20 U/L (ref 6–29)
AST: 31 U/L (ref 10–35)
Albumin: 4.4 g/dL (ref 3.6–5.1)
Alkaline phosphatase (APISO): 107 U/L (ref 37–153)
BUN: 14 mg/dL (ref 7–25)
CO2: 29 mmol/L (ref 20–32)
Calcium: 9.6 mg/dL (ref 8.6–10.4)
Chloride: 103 mmol/L (ref 98–110)
Creat: 0.84 mg/dL (ref 0.50–1.05)
GFR, Est African American: 91 mL/min/{1.73_m2} (ref >=60)
GFR, Est Non African American: 78 mL/min/{1.73_m2} (ref >=60)
Globulin: 2.8 g/dL (calc) (ref 1.9–3.7)
Glucose, Bld: 93 mg/dL (ref 65–99)
Potassium: 4.6 mmol/L (ref 3.5–5.3)
Sodium: 142 mmol/L (ref 135–146)
Total Bilirubin: 0.6 mg/dL (ref 0.2–1.2)
Total Protein: 7.2 g/dL (ref 6.1–8.1)

## 2020-09-02 LAB — IRON,TIBC AND FERRITIN PANEL
%SAT: 35 % (calc) (ref 16–45)
Ferritin: 34 ng/mL (ref 16–232)
Iron: 115 ug/dL (ref 45–160)
TIBC: 326 mcg/dL (calc) (ref 250–450)

## 2020-09-02 LAB — LIPID PANEL W/REFLEX DIRECT LDL
Cholesterol: 204 mg/dL — ABNORMAL HIGH (ref <200)
HDL: 88 mg/dL (ref >=50)
LDL Cholesterol (Calc): 99 mg/dL (calc)
Non-HDL Cholesterol (Calc): 116 mg/dL (calc) (ref <130)
Total CHOL/HDL Ratio: 2.3 (calc) (ref <5.0)
Triglycerides: 83 mg/dL (ref <150)

## 2020-09-02 LAB — HEPATITIS C ANTIBODY
Hepatitis C Ab: NONREACTIVE
SIGNAL TO CUT-OFF: 0.01 (ref <1.00)

## 2020-09-02 LAB — TSH: TSH: 2.55 mIU/L

## 2020-09-03 ENCOUNTER — Ambulatory Visit (INDEPENDENT_AMBULATORY_CARE_PROVIDER_SITE_OTHER): Payer: PPO

## 2020-09-03 ENCOUNTER — Other Ambulatory Visit: Payer: Self-pay

## 2020-09-03 DIAGNOSIS — Z1231 Encounter for screening mammogram for malignant neoplasm of breast: Secondary | ICD-10-CM | POA: Diagnosis not present

## 2020-09-04 DIAGNOSIS — Z79891 Long term (current) use of opiate analgesic: Secondary | ICD-10-CM | POA: Diagnosis not present

## 2020-09-04 DIAGNOSIS — M5459 Other low back pain: Secondary | ICD-10-CM | POA: Diagnosis not present

## 2020-09-04 DIAGNOSIS — Z79899 Other long term (current) drug therapy: Secondary | ICD-10-CM | POA: Diagnosis not present

## 2020-09-04 DIAGNOSIS — F1120 Opioid dependence, uncomplicated: Secondary | ICD-10-CM | POA: Diagnosis not present

## 2020-09-08 NOTE — Progress Notes (Signed)
Normal mammogram

## 2020-10-07 DIAGNOSIS — F17210 Nicotine dependence, cigarettes, uncomplicated: Secondary | ICD-10-CM | POA: Diagnosis not present

## 2020-10-07 DIAGNOSIS — F1120 Opioid dependence, uncomplicated: Secondary | ICD-10-CM | POA: Diagnosis not present

## 2020-10-07 DIAGNOSIS — Z79891 Long term (current) use of opiate analgesic: Secondary | ICD-10-CM | POA: Diagnosis not present

## 2020-10-07 DIAGNOSIS — Z79899 Other long term (current) drug therapy: Secondary | ICD-10-CM | POA: Diagnosis not present

## 2020-10-07 DIAGNOSIS — M5459 Other low back pain: Secondary | ICD-10-CM | POA: Diagnosis not present

## 2020-11-06 DIAGNOSIS — F1120 Opioid dependence, uncomplicated: Secondary | ICD-10-CM | POA: Diagnosis not present

## 2020-11-06 DIAGNOSIS — Z6828 Body mass index (BMI) 28.0-28.9, adult: Secondary | ICD-10-CM | POA: Diagnosis not present

## 2020-11-06 DIAGNOSIS — Z79899 Other long term (current) drug therapy: Secondary | ICD-10-CM | POA: Diagnosis not present

## 2020-11-06 DIAGNOSIS — M5459 Other low back pain: Secondary | ICD-10-CM | POA: Diagnosis not present

## 2020-11-06 DIAGNOSIS — Z79891 Long term (current) use of opiate analgesic: Secondary | ICD-10-CM | POA: Diagnosis not present

## 2020-11-06 DIAGNOSIS — N3010 Interstitial cystitis (chronic) without hematuria: Secondary | ICD-10-CM | POA: Diagnosis not present

## 2020-11-27 DIAGNOSIS — R350 Frequency of micturition: Secondary | ICD-10-CM | POA: Diagnosis not present

## 2020-11-27 DIAGNOSIS — R3915 Urgency of urination: Secondary | ICD-10-CM | POA: Diagnosis not present

## 2020-11-27 DIAGNOSIS — Z87440 Personal history of urinary (tract) infections: Secondary | ICD-10-CM | POA: Diagnosis not present

## 2020-11-27 DIAGNOSIS — N3010 Interstitial cystitis (chronic) without hematuria: Secondary | ICD-10-CM | POA: Diagnosis not present

## 2020-11-27 DIAGNOSIS — Z87442 Personal history of urinary calculi: Secondary | ICD-10-CM | POA: Diagnosis not present

## 2020-12-04 DIAGNOSIS — Z6827 Body mass index (BMI) 27.0-27.9, adult: Secondary | ICD-10-CM | POA: Diagnosis not present

## 2020-12-04 DIAGNOSIS — Z79899 Other long term (current) drug therapy: Secondary | ICD-10-CM | POA: Diagnosis not present

## 2020-12-04 DIAGNOSIS — N3010 Interstitial cystitis (chronic) without hematuria: Secondary | ICD-10-CM | POA: Diagnosis not present

## 2020-12-04 DIAGNOSIS — Z79891 Long term (current) use of opiate analgesic: Secondary | ICD-10-CM | POA: Diagnosis not present

## 2020-12-04 DIAGNOSIS — F1120 Opioid dependence, uncomplicated: Secondary | ICD-10-CM | POA: Diagnosis not present

## 2020-12-04 DIAGNOSIS — M5459 Other low back pain: Secondary | ICD-10-CM | POA: Diagnosis not present

## 2020-12-08 DIAGNOSIS — Z79899 Other long term (current) drug therapy: Secondary | ICD-10-CM | POA: Diagnosis not present

## 2020-12-23 ENCOUNTER — Telehealth: Payer: Self-pay | Admitting: Physician Assistant

## 2020-12-23 NOTE — Chronic Care Management (AMB) (Signed)
  Chronic Care Management   Outreach Note  12/23/2020 Name: [name] MRN: [id number] DOB: [birth year]  Referred by: Donella Stade, PA-C Reason for referral : No chief complaint on file.   An unsuccessful telephone outreach was attempted today. The patient was referred to the pharmacist for assistance with care management and care coordination.   Follow Up Plan:   Lauretta Grill Upstream Scheduler

## 2020-12-24 ENCOUNTER — Telehealth: Payer: Self-pay | Admitting: Physician Assistant

## 2020-12-24 NOTE — Progress Notes (Signed)
  Chronic Care Management   Outreach Note  12/24/2020 Name: [name] MRN: [id number] DOB: 11-Mar-1966  Referred by: Donella Stade, PA-C Reason for referral : No chief complaint on file.   A second unsuccessful telephone outreach was attempted today. The patient was referred to pharmacist for assistance with care management and care coordination.  Follow Up Plan:   Lauretta Grill Upstream Scheduler

## 2021-01-02 DIAGNOSIS — M5459 Other low back pain: Secondary | ICD-10-CM | POA: Diagnosis not present

## 2021-01-02 DIAGNOSIS — Z6827 Body mass index (BMI) 27.0-27.9, adult: Secondary | ICD-10-CM | POA: Diagnosis not present

## 2021-01-02 DIAGNOSIS — N3010 Interstitial cystitis (chronic) without hematuria: Secondary | ICD-10-CM | POA: Diagnosis not present

## 2021-01-02 DIAGNOSIS — Z79899 Other long term (current) drug therapy: Secondary | ICD-10-CM | POA: Diagnosis not present

## 2021-01-02 DIAGNOSIS — I10 Essential (primary) hypertension: Secondary | ICD-10-CM | POA: Diagnosis not present

## 2021-01-02 DIAGNOSIS — F1120 Opioid dependence, uncomplicated: Secondary | ICD-10-CM | POA: Diagnosis not present

## 2021-01-05 ENCOUNTER — Telehealth: Payer: Self-pay | Admitting: Physician Assistant

## 2021-01-05 DIAGNOSIS — Z79899 Other long term (current) drug therapy: Secondary | ICD-10-CM | POA: Diagnosis not present

## 2021-01-05 NOTE — Chronic Care Management (AMB) (Signed)
  Chronic Care Management   Outreach Note  01/05/2021 Name: [name] MRN: [id number] DOB: 08/22/1965  Referred by: Donella Stade, PA-C Reason for referral : No chief complaint on file.   Third unsuccessful telephone outreach was attempted today. The patient was referred to the pharmacist for assistance with care management and care coordination.   Follow Up Plan:   Lauretta Grill Upstream Scheduler

## 2021-02-02 DIAGNOSIS — N3010 Interstitial cystitis (chronic) without hematuria: Secondary | ICD-10-CM | POA: Diagnosis not present

## 2021-02-02 DIAGNOSIS — Z79891 Long term (current) use of opiate analgesic: Secondary | ICD-10-CM | POA: Diagnosis not present

## 2021-02-02 DIAGNOSIS — Z6827 Body mass index (BMI) 27.0-27.9, adult: Secondary | ICD-10-CM | POA: Diagnosis not present

## 2021-02-02 DIAGNOSIS — I10 Essential (primary) hypertension: Secondary | ICD-10-CM | POA: Diagnosis not present

## 2021-02-02 DIAGNOSIS — Z79899 Other long term (current) drug therapy: Secondary | ICD-10-CM | POA: Diagnosis not present

## 2021-02-02 DIAGNOSIS — F1120 Opioid dependence, uncomplicated: Secondary | ICD-10-CM | POA: Diagnosis not present

## 2021-02-02 DIAGNOSIS — M5459 Other low back pain: Secondary | ICD-10-CM | POA: Diagnosis not present

## 2021-02-03 ENCOUNTER — Other Ambulatory Visit: Payer: Self-pay | Admitting: Neurology

## 2021-02-03 DIAGNOSIS — G2581 Restless legs syndrome: Secondary | ICD-10-CM

## 2021-02-03 DIAGNOSIS — F330 Major depressive disorder, recurrent, mild: Secondary | ICD-10-CM

## 2021-02-03 MED ORDER — PRAMIPEXOLE DIHYDROCHLORIDE 0.125 MG PO TABS: ORAL_TABLET | ORAL | 0 refills | Status: DC

## 2021-02-03 MED ORDER — DULOXETINE HCL 30 MG PO CPEP: 90.0000 mg | ORAL_CAPSULE | Freq: Every day | ORAL | 0 refills | Status: DC

## 2021-02-04 DIAGNOSIS — Z79899 Other long term (current) drug therapy: Secondary | ICD-10-CM | POA: Diagnosis not present

## 2021-02-19 ENCOUNTER — Telehealth: Payer: Self-pay | Admitting: Physician Assistant

## 2021-02-19 NOTE — Telephone Encounter (Signed)
Left message for patient to call back and schedule Medicare Annual Wellness Visit (AWV) either virtually or in office. I left my phone # [phone]  Last AWV 06/18/15 please schedule at anytime with health coach

## 2021-02-28 ENCOUNTER — Encounter: Payer: Self-pay | Admitting: Physician Assistant

## 2021-02-28 DIAGNOSIS — R110 Nausea: Secondary | ICD-10-CM

## 2021-03-02 MED ORDER — PROMETHAZINE HCL 25 MG PO TABS: ORAL_TABLET | ORAL | 1 refills | Status: DC

## 2021-03-05 DIAGNOSIS — Z79899 Other long term (current) drug therapy: Secondary | ICD-10-CM | POA: Diagnosis not present

## 2021-03-05 DIAGNOSIS — M5459 Other low back pain: Secondary | ICD-10-CM | POA: Diagnosis not present

## 2021-03-05 DIAGNOSIS — F1120 Opioid dependence, uncomplicated: Secondary | ICD-10-CM | POA: Diagnosis not present

## 2021-03-05 DIAGNOSIS — Z79891 Long term (current) use of opiate analgesic: Secondary | ICD-10-CM | POA: Diagnosis not present

## 2021-03-06 NOTE — Telephone Encounter (Signed)
Patient called back and left vm. Needs MWV.

## 2021-03-10 DIAGNOSIS — Z79899 Other long term (current) drug therapy: Secondary | ICD-10-CM | POA: Diagnosis not present

## 2021-03-16 ENCOUNTER — Other Ambulatory Visit: Payer: Self-pay

## 2021-03-16 ENCOUNTER — Ambulatory Visit (INDEPENDENT_AMBULATORY_CARE_PROVIDER_SITE_OTHER): Payer: PPO | Admitting: Physician Assistant

## 2021-03-16 DIAGNOSIS — Z0000 Encounter for general adult medical examination without abnormal findings: Secondary | ICD-10-CM

## 2021-03-16 NOTE — Patient Instructions (Addendum)
[place] Maintenance Summary and Written Plan of Care  Ms. Simeone ,  Thank you for allowing me to perform your Medicare Annual Wellness Visit and for your ongoing commitment to your health.   Health Maintenance & Immunization History Health Maintenance  Topic Date Due   COVID-19 Vaccine (1) 04/01/2021 (Originally 05/25/1966)   Zoster Vaccines- Shingrix (1 of 2) 06/16/2021 (Originally 11/24/1983)   INFLUENZA VACCINE  09/11/2021 (Originally 01/12/2021)   HIV Screening  04/18/2048 (Originally 11/24/1979)   TETANUS/TDAP  09/07/2021   MAMMOGRAM  09/04/2022   COLONOSCOPY (Pts 45-91yrs Insurance coverage will need to be confirmed)  04/02/2025   Hepatitis C Screening  Completed   HPV VACCINES  Aged Out   Immunization History  Administered Date(s) Administered   Influenza,inj,Quad PF,6+ Mos 02/24/2015, 03/22/2016, 03/16/2017, 04/18/2018   Influenza,inj,quad, With Preservative 03/22/2016   PPD Test 05/29/2012   Td 05/16/2009   Tdap 09/08/2011    These are the patient goals that we discussed:  Goals Addressed               This Visit's Progress     Patient Stated (pt-stated)        03/16/2021 AWV Goal: Exercise for General Health  Patient will verbalize understanding of the benefits of increased physical activity: Exercising regularly is important. It will improve your overall fitness, flexibility, and endurance. Regular exercise also will improve your overall health. It can help you control your weight, reduce stress, and improve your bone density. Over the next year, patient will increase physical activity as tolerated with a goal of at least 150 minutes of moderate physical activity per week.  You can tell that you are exercising at a moderate intensity if your heart starts beating faster and you start breathing faster but can still hold a conversation. Moderate-intensity exercise ideas include: Walking 1 mile (1.6 km) in about 15  minutes Biking Hiking Golfing Dancing Water aerobics Patient will verbalize understanding of everyday activities that increase physical activity by providing examples like the following: Yard work, such as: Sales promotion account executive Gardening Washing windows or floors Patient will be able to explain general safety guidelines for exercising:  Before you start a new exercise program, talk with your health care provider. Do not exercise so much that you hurt yourself, feel dizzy, or get very short of breath. Wear comfortable clothes and wear shoes with good support. Drink plenty of water while you exercise to prevent dehydration or heat stroke. Work out until your breathing and your heartbeat get faster.          This is a list of Health Maintenance Items that are overdue or due now: Influenza vaccine Shingrix vaccine Eye exam   Orders/Referrals Placed Today: No orders of the defined types were placed in this encounter.  (Contact our referral department at [phone] if you have not spoken with someone about your referral appointment within the next 5 days)    Follow-up Plan Follow-up with Donella Stade, PA-C as planned Schedule your shingles vaccine at your pharmacy.  Let us know if you change your mind about the lung cancer screening. Flu shot can be done tomorrow at your in-office visit.  Medicare wellness visit in one year.  Patient will access AVS on mychart.      Health Maintenance, Female Adopting a healthy lifestyle and getting preventive care are important in promoting health and wellness. Ask your health  care provider about: The right schedule for you to have regular tests and exams. Things you can do on your own to prevent diseases and keep yourself healthy. What should I know about diet, weight, and exercise? Eat a healthy diet  Eat a diet that includes plenty of vegetables,  fruits, low-fat dairy products, and lean protein. Do not eat a lot of foods that are high in solid fats, added sugars, or sodium. Maintain a healthy weight Body mass index (BMI) is used to identify weight problems. It estimates body fat based on height and weight. Your health care provider can help determine your BMI and help you achieve or maintain a healthy weight. Get regular exercise Get regular exercise. This is one of the most important things you can do for your health. Most adults should: Exercise for at least 150 minutes each week. The exercise should increase your heart rate and make you sweat (moderate-intensity exercise). Do strengthening exercises at least twice a week. This is in addition to the moderate-intensity exercise. Spend less time sitting. Even light physical activity can be beneficial. Watch cholesterol and blood lipids Have your blood tested for lipids and cholesterol at 56 years of age, then have this test every 5 years. Have your cholesterol levels checked more often if: Your lipid or cholesterol levels are high. You are older than 56 years of age. You are at high risk for heart disease. What should I know about cancer screening? Depending on your health history and family history, you may need to have cancer screening at various ages. This may include screening for: Breast cancer. Cervical cancer. Colorectal cancer. Skin cancer. Lung cancer. What should I know about heart disease, diabetes, and high blood pressure? Blood pressure and heart disease High blood pressure causes heart disease and increases the risk of stroke. This is more likely to develop in people who have high blood pressure readings, are of African descent, or are overweight. Have your blood pressure checked: Every 3-5 years if you are 57-60 years of age. Every year if you are 48 years old or older. Diabetes Have regular diabetes screenings. This checks your fasting blood sugar level. Have the  screening done: Once every three years after age 60 if you are at a normal weight and have a low risk for diabetes. More often and at a younger age if you are overweight or have a high risk for diabetes. What should I know about preventing infection? Hepatitis B If you have a higher risk for hepatitis B, you should be screened for this virus. Talk with your health care provider to find out if you are at risk for hepatitis B infection. Hepatitis C Testing is recommended for: Everyone born from [birth year] through 1966. Anyone with known risk factors for hepatitis C. Sexually transmitted infections (STIs) Get screened for STIs, including gonorrhea and chlamydia, if: You are sexually active and are younger than 56 years of age. You are older than 56 years of age and your health care provider tells you that you are at risk for this type of infection. Your sexual activity has changed since you were last screened, and you are at increased risk for chlamydia or gonorrhea. Ask your health care provider if you are at risk. Ask your health care provider about whether you are at high risk for HIV. Your health care provider may recommend a prescription medicine to help prevent HIV infection. If you choose to take medicine to prevent HIV, you should first get tested for  HIV. You should then be tested every 3 months for as long as you are taking the medicine. Pregnancy If you are about to stop having your period (premenopausal) and you may become pregnant, seek counseling before you get pregnant. Take 400 to 800 micrograms (mcg) of folic acid every day if you become pregnant. Ask for birth control (contraception) if you want to prevent pregnancy. Osteoporosis and menopause Osteoporosis is a disease in which the bones lose minerals and strength with aging. This can result in bone fractures. If you are 41 years old or older, or if you are at risk for osteoporosis and fractures, ask your health care provider if you  should: Be screened for bone loss. Take a calcium or vitamin D supplement to lower your risk of fractures. Be given hormone replacement therapy (HRT) to treat symptoms of menopause. Follow these instructions at home: Lifestyle Do not use any products that contain nicotine or tobacco, such as cigarettes, e-cigarettes, and chewing tobacco. If you need help quitting, ask your health care provider. Do not use street drugs. Do not share needles. Ask your health care provider for help if you need support or information about quitting drugs. Alcohol use Do not drink alcohol if: Your health care provider tells you not to drink. You are pregnant, may be pregnant, or are planning to become pregnant. If you drink alcohol: Limit how much you use to 0-1 drink a day. Limit intake if you are breastfeeding. Be aware of how much alcohol is in your drink. In the [place] one drink equals one 12 oz bottle of beer (355 mL), one 5 oz glass of wine (148 mL), or one 1 oz glass of hard liquor (44 mL). General instructions Schedule regular health, dental, and eye exams. Stay current with your vaccines. Tell your health care provider if: You often feel depressed. You have ever been abused or do not feel safe at home. Summary Adopting a healthy lifestyle and getting preventive care are important in promoting health and wellness. Follow your health care provider's instructions about healthy diet, exercising, and getting tested or screened for diseases. Follow your health care provider's instructions on monitoring your cholesterol and blood pressure. This information is not intended to replace advice given to you by your health care provider. Make sure you discuss any questions you have with your health care provider. Document Revised: 08/08/2020 Document Reviewed: 05/24/2018 Elsevier Patient Education  2022 [employer].

## 2021-03-16 NOTE — Progress Notes (Signed)
MEDICARE ANNUAL WELLNESS VISIT  03/16/2021  Telephone Visit Disclaimer This Medicare AWV was conducted by telephone due to national recommendations for restrictions regarding the COVID-19 Pandemic (e.g. social distancing).  I verified, using two identifiers, that I am speaking with [name] or their authorized healthcare agent. I discussed the limitations, risks, security, and privacy concerns of performing an evaluation and management service by telephone and the potential availability of an in-person appointment in the future. The patient expressed understanding and agreed to proceed.  Location of Patient: Home Location of Provider (nurse):  In the office.  Subjective:    [name] is a 55 y.o. female patient of [name], Shelly Car, PA-C who had a [employer] Visit today via telephone. [name] is Legally disabled and lives with their spouse. she has 2 children. she reports that she is socially active and does interact with friends/family regularly. she is moderately physically active and enjoys collecting gemstones.  Patient Care Team: Lavada Mesi as PCP - General (Family Medicine)  Advanced Directives 03/16/2021 11/22/2016 12/11/2012 08/01/2012  Does Patient Have a Medical Advance Directive? Yes Yes Patient does not have advance directive;Patient would not like information Patient does not have advance directive  Type of Advance Directive Living will;Healthcare Power of [place] - -  Does patient want to make changes to medical advance directive? No - Patient declined - - -  Copy of [place] in Chart? No - copy requested Yes - -  Pre-existing out of facility DNR order (yellow form or pink MOST form) - - No -    Hospital Utilization Over the Past 12 Months: # of hospitalizations or ER visits: 0 # of surgeries: 1  Review of Systems    Patient reports that her overall health is unchanged compared to  last year.  History obtained from chart review and the patient  Patient Reported Readings (BP, Pulse, CBG, Weight, etc) none  Pain Assessment Pain : No/denies pain     Current Medications & Allergies (verified) Allergies as of 03/16/2021       Reactions   Gabapentin Anaphylaxis   Sleepiness Sleepiness   Lyrica [pregabalin] Anaphylaxis   Made symptoms worse.  Made symptoms worse.    Ambien [zolpidem] Other (See Comments)   Insomnia Pt did not state her reaction    Amitriptyline Other (See Comments)   Pt did not state her reaction   Bactrim [sulfamethoxazole-trimethoprim] Nausea Only   GI upset        Medication List        Accurate as of March 16, 2021  1:40 PM. If you have any questions, ask your nurse or doctor.          20-25CC SYRINGE 25 ML Misc USE WITH BLADDER INSTILLATIONS UP TO TWICE A DAY   AMBULATORY NON FORMULARY MEDICATION Tubing for nebulizer for bronchitis   AMBULATORY NON FORMULARY MEDICATION shingrx 2 doses to prevent shingles.   atenolol 50 MG tablet Commonly known as: TENORMIN TAKE ONE-HALF TABLET BY MOUTH ONCE DAILY   B-D HYPODERMIC NEEDLE 18GX1.5" 18G X 1-1/2" Misc Generic drug: NEEDLE (DISP) 18 G USE AS DIRECTED WITH BLADDER INSTALLATIONS TWICE A DAY   bupivacaine 0.5 % Soln injection Commonly known as: MARCAINE Instill 15 cc into bladder daily   clotrimazole-betamethasone cream Commonly known as: LOTRISONE Apply 1 application topically 2 (two) times daily.   cyclobenzaprine 10 MG tablet Commonly known as: FLEXERIL Take 1 tablet (10  mg total) by mouth 3 (three) times daily as needed for muscle spasms.   DULoxetine 30 MG capsule Commonly known as: CYMBALTA Take 3 capsules (90 mg total) by mouth daily. Appt for further refills   hydrOXYzine 25 MG tablet Commonly known as: ATARAX/VISTARIL Take 25 mg by mouth 3 (three) times daily as needed.   lidocaine 2 % injection Commonly known as: XYLOCAINE   linaclotide 290 MCG  Caps capsule Commonly known as: Linzess Take 1 capsule (290 mcg total) by mouth daily before breakfast.   morphine 60 MG 12 hr tablet Commonly known as: MS CONTIN Take 60 mg by mouth.   nitrofurantoin 50 MG capsule Commonly known as: MACRODANTIN Take 50 mg by mouth daily.   pantoprazole 40 MG tablet Commonly known as: PROTONIX Take 1 tablet (40 mg total) by mouth daily.   pentosan polysulfate 100 MG capsule Commonly known as: ELMIRON Take 100 mg by mouth 2 (two) times daily. Take two tablets by mouth twice a day   pramipexole 0.125 MG tablet Commonly known as: MIRAPEX TAKE 1 TABLET AT BEDTIME AS NEEDED FOR RESTLESS LEGS/ appt for further refills   promethazine 25 MG tablet Commonly known as: PHENERGAN Take one tablet daily as needed for nausea.        History (reviewed): Past Medical History:  Diagnosis Date   Anxiety    Cancer ([place])    cervical   Colitis    lymphacitic   Depression    Dysrhythmia    pvc    Gastric ulcer    Headache(784.0)    Heart murmur    IBS (irritable bowel syndrome)    Interstitial cystitis    MVP (mitral valve prolapse)    Urinary calculus or stone    Past Surgical History:  Procedure Laterality Date   APPENDECTOMY  2005   BILATERAL OOPHORECTOMY  2012   DILATION AND CURETTAGE OF UTERUS     x 4  one after each SAB   EXPLORATORY LAPAROTOMY  2007   LAPAROSCOPY N/A 08/04/2012   Procedure: LAPAROSCOPY DIAGNOSTIC;  Surgeon: Gayland Curry, MD,FACS;  Location: [facility];  Service: General;  Laterality: N/A;   PARTIAL HYSTERECTOMY  2005   TONSILLECTOMY  78 years ago   TUBAL LIGATION  2000   Family History  Problem Relation Age of Onset   Heart attack Mother    Cirrhosis Mother    Cancer Father        tongue   Cirrhosis Father    Cancer Paternal Uncle    Colon cancer Neg Hx    Esophageal cancer Neg Hx    Rectal cancer Neg Hx    Stomach cancer Neg Hx    Breast cancer Neg Hx    Social History   Socioeconomic History   Marital  status: Married    Spouse name: Legrand Como   Number of children: 2   Years of education: 12   Highest education level: 12th grade  Occupational History   Occupation: Legally disabled  Tobacco Use   Smoking status: Every Day    Packs/day: 1.00    Years: 25.00    Pack years: 25.00    Types: Cigarettes   Smokeless tobacco: Never  Substance and Sexual Activity   Alcohol use: Yes    Comment: rarely   Drug use: No   Sexual activity: Yes    Partners: Male  Other Topics Concern   Not on file  Social History Narrative   Lives with her husband. She has  two children. She enjoys collecting gemstones and selling them.   Social Determinants of Health   Financial Resource Strain: Low Risk    Difficulty of Paying Living Expenses: Not hard at all  Food Insecurity: No Food Insecurity   Worried About Charity fundraiser in the Last Year: Never true   [place] in the Last Year: Never true  Transportation Needs: No Transportation Needs   Lack of Transportation (Medical): No   Lack of Transportation (Non-Medical): No  Physical Activity: Insufficiently Active   Days of Exercise per Week: 3 days   Minutes of Exercise per Session: 30 min  Stress: Stress Concern Present   Feeling of Stress : To some extent  Social Connections: Moderately Isolated   Frequency of Communication with Friends and Family: Twice a week   Frequency of Social Gatherings with Friends and Family: Twice a week   Attends Religious Services: Never   Marine scientist or Organizations: No   Attends Archivist Meetings: Never   Marital Status: Married    Activities of Daily Living In your present state of health, do you have any difficulty performing the following activities: 03/16/2021  Hearing? N  Vision? N  Difficulty concentrating or making decisions? N  Walking or climbing stairs? Y  Comment stairs can be an issue if she is having a flaire up.  Dressing or bathing? N  Doing errands, shopping? N   Preparing Food and eating ? N  Using the Toilet? N  In the past six months, have you accidently leaked urine? Y  Comment she has a few accidents.  Do you have problems with loss of bowel control? N  Managing your Medications? N  Managing your Finances? N  Housekeeping or managing your Housekeeping? N  Some recent data might be hidden    Patient Education/ Literacy How often do you need to have someone help you when you read instructions, pamphlets, or other written materials from your doctor or pharmacy?: 1 - Never What is the last grade level you completed in school?: 12th grade  Exercise Current Exercise Habits: Structured exercise class, Type of exercise: treadmill, Time (Minutes): 30, Frequency (Times/Week): 3, Weekly Exercise (Minutes/Week): 90, Intensity: Moderate, Exercise limited by: None identified  Diet Patient reports consuming 2 meals a day and 0 snack(s) a day Patient reports that her primary diet is: Regular Patient reports that she does have regular access to food.   Depression Screen PHQ 2/9 Scores 03/16/2021 08/29/2020 04/02/2019 04/22/2018 12/12/2017 03/16/2017  PHQ - 2 Score 2 4 4 1 4 3   PHQ- 9 Score 7 13 10 3 8 9      Fall Risk Fall Risk  03/16/2021 08/29/2020  Falls in the past year? 0 0  Number falls in past yr: 0 0  Injury with Fall? 0 0  Risk for fall due to : No Fall Risks -  Follow up Falls evaluation completed;Education provided;Falls prevention discussed Falls evaluation completed     Objective:  [name] seemed alert and oriented and she participated appropriately during our telephone visit.  Blood Pressure Weight BMI  BP Readings from Last 3 Encounters:  08/29/20 (!) 157/98  06/26/19 (!) 130/95  04/02/19 136/83   Wt Readings from Last 3 Encounters:  08/29/20 179 lb (81.2 kg)  06/26/19 179 lb (81.2 kg)  04/02/19 177 lb (80.3 kg)   BMI Readings from Last 1 Encounters:  08/29/20 28.89 kg/m    *Unable to obtain current vital  signs,  weight, and BMI due to telephone visit type  Hearing/Vision  [name] did not seem to have difficulty with hearing/understanding during the telephone conversation Reports that she has not had a formal eye exam by an eye care professional within the past year Reports that she has not had a formal hearing evaluation within the past year *Unable to fully assess hearing and vision during telephone visit type  Cognitive Function: 6CIT Screen 03/16/2021  What Year? 0 points  What month? 0 points  What time? 0 points  Count back from 20 0 points  Months in reverse 0 points  Repeat phrase 0 points  Total Score 0   (Normal:0-7, Significant for Dysfunction: >8)  Normal Cognitive Function Screening: Yes   Immunization & Health Maintenance Record Immunization History  Administered Date(s) Administered   Influenza,inj,Quad PF,6+ Mos 02/24/2015, 03/22/2016, 03/16/2017, 04/18/2018   Influenza,inj,quad, With Preservative 03/22/2016   PPD Test 05/29/2012   Td 05/16/2009   Tdap 09/08/2011    Health Maintenance  Topic Date Due   COVID-19 Vaccine (1) 04/01/2021 (Originally 05/25/1966)   Zoster Vaccines- Shingrix (1 of 2) 06/16/2021 (Originally 11/24/1983)   INFLUENZA VACCINE  09/11/2021 (Originally 01/12/2021)   HIV Screening  04/18/2048 (Originally 11/24/1979)   TETANUS/TDAP  09/07/2021   MAMMOGRAM  09/04/2022   COLONOSCOPY (Pts 45-1yrs Insurance coverage will need to be confirmed)  04/02/2025   Hepatitis C Screening  Completed   HPV VACCINES  Aged Out       Assessment  This is a routine wellness examination for [employer].  Health Maintenance: Due or Overdue There are no preventive care reminders to display for this patient.   [name] does not need a referral for [employer] Assistance: Care Management:   no Social Work:    no Prescription Assistance:  no Nutrition/Diabetes Education:  no   Plan:  Personalized Goals  Goals Addressed               This Visit's  Progress     Patient Stated (pt-stated)        03/16/2021 AWV Goal: Exercise for General Health  Patient will verbalize understanding of the benefits of increased physical activity: Exercising regularly is important. It will improve your overall fitness, flexibility, and endurance. Regular exercise also will improve your overall health. It can help you control your weight, reduce stress, and improve your bone density. Over the next year, patient will increase physical activity as tolerated with a goal of at least 150 minutes of moderate physical activity per week.  You can tell that you are exercising at a moderate intensity if your heart starts beating faster and you start breathing faster but can still hold a conversation. Moderate-intensity exercise ideas include: Walking 1 mile (1.6 km) in about 15 minutes Biking Hiking Golfing Dancing Water aerobics Patient will verbalize understanding of everyday activities that increase physical activity by providing examples like the following: Yard work, such as: Sales promotion account executive Gardening Washing windows or floors Patient will be able to explain general safety guidelines for exercising:  Before you start a new exercise program, talk with your health care provider. Do not exercise so much that you hurt yourself, feel dizzy, or get very short of breath. Wear comfortable clothes and wear shoes with good support. Drink plenty of water while you exercise to prevent dehydration or heat stroke. Work out until your breathing and your heartbeat get faster.  Personalized Health Maintenance & Screening Recommendations  Influenza vaccine Shingrix vaccine Eye exam  Lung Cancer Screening Recommended: yes ; she does not want to schedule at this time and will let us know when she is ready. (Low Dose CT Chest recommended if Age 54-80 years, 30 pack-year currently  smoking OR have quit w/in past 15 years) Hepatitis C Screening recommended: no HIV Screening recommended: yes  Advanced Directives: Written information was not prepared per patient's request.  Referrals & Orders No orders of the defined types were placed in this encounter.   Follow-up Plan Follow-up with Donella Stade, PA-C as planned Schedule your shingles vaccine at your pharmacy.  Let us know if you change your mind about the lung cancer screening. Flu shot can be done tomorrow at your in-office visit.  Medicare wellness visit in one year.  Patient will access AVS on mychart.   I have personally reviewed and noted the following in the patient's chart:   Medical and social history Use of alcohol, tobacco or illicit drugs  Current medications and supplements Functional ability and status Nutritional status Physical activity Advanced directives List of other physicians Hospitalizations, surgeries, and ER visits in previous 12 months Vitals Screenings to include cognitive, depression, and falls Referrals and appointments  In addition, I have reviewed and discussed with [name] certain preventive protocols, quality metrics, and best practice recommendations. A written personalized care plan for preventive services as well as general preventive health recommendations is available and can be mailed to the patient at her request.      Tinnie Gens, RN  03/16/2021

## 2021-03-17 ENCOUNTER — Encounter: Payer: Self-pay | Admitting: Physician Assistant

## 2021-03-17 ENCOUNTER — Ambulatory Visit (INDEPENDENT_AMBULATORY_CARE_PROVIDER_SITE_OTHER): Payer: PPO | Admitting: Physician Assistant

## 2021-03-17 ENCOUNTER — Other Ambulatory Visit: Payer: Self-pay

## 2021-03-17 VITALS — BP 146/89 | HR 84 | Ht 66.0 in | Wt 163.0 lb

## 2021-03-17 DIAGNOSIS — G478 Other sleep disorders: Secondary | ICD-10-CM

## 2021-03-17 DIAGNOSIS — Z23 Encounter for immunization: Secondary | ICD-10-CM | POA: Diagnosis not present

## 2021-03-17 DIAGNOSIS — E7800 Pure hypercholesterolemia, unspecified: Secondary | ICD-10-CM | POA: Diagnosis not present

## 2021-03-17 DIAGNOSIS — F330 Major depressive disorder, recurrent, mild: Secondary | ICD-10-CM | POA: Diagnosis not present

## 2021-03-17 DIAGNOSIS — Z8249 Family history of ischemic heart disease and other diseases of the circulatory system: Secondary | ICD-10-CM | POA: Diagnosis not present

## 2021-03-17 DIAGNOSIS — G2581 Restless legs syndrome: Secondary | ICD-10-CM

## 2021-03-17 DIAGNOSIS — F8081 Childhood onset fluency disorder: Secondary | ICD-10-CM | POA: Diagnosis not present

## 2021-03-17 DIAGNOSIS — R4789 Other speech disturbances: Secondary | ICD-10-CM | POA: Diagnosis not present

## 2021-03-17 DIAGNOSIS — K5903 Drug induced constipation: Secondary | ICD-10-CM

## 2021-03-17 MED ORDER — PRAMIPEXOLE DIHYDROCHLORIDE 0.125 MG PO TABS: ORAL_TABLET | ORAL | 1 refills | Status: DC

## 2021-03-17 MED ORDER — DULOXETINE HCL 30 MG PO CPEP: 90.0000 mg | ORAL_CAPSULE | Freq: Every day | ORAL | 1 refills | Status: DC

## 2021-03-17 MED ORDER — LINACLOTIDE 290 MCG PO CAPS: 290.0000 ug | ORAL_CAPSULE | Freq: Every day | ORAL | 3 refills | Status: AC

## 2021-03-17 NOTE — Progress Notes (Signed)
Subjective:    Patient ID: [name], female    DOB: Jul 16, 1965, 55 y.o.   MRN: [id number]  HPI Patient is a 55 year old obese female with constipation, chronic pain due to IC and OA and seen pain clinic, MDD and anxiety who presents to the clinic for medication refill and to discuss 2 weeks of stuttering and word finding difficulty.  Her husband has also noted that she is sleep talking and much more active in her sleep.  Her cardiologist did put her on atenolol and she did stop Elmiron but those were the only medication changes.  She is stressed currently but she feels like it is no more than she normally is.  Cymbalta was increased about 6 months ago and she has done better with her mood since starting Cymbalta at a higher dose. She denies any strength or extremity changes, vision changes, headache. She does continue to smoke daily.  Patient has no trouble going to sleep or staying asleep.  .. Active Ambulatory Problems    Diagnosis Date Noted   TOBACCO ABUSE 05/28/2009   MITRAL VALVE PROLAPSE 05/28/2009   UNSPECIFIED CARDIAC DYSRHYTHMIA 05/16/2009   TMJ PAIN 07/26/2008   GASTRIC ULCER 05/26/2009   NEPHROLITHIASIS 03/06/2010   HEMATURIA UNSPECIFIED 12/19/2008   OVARIAN CYST 03/06/2010   PALPITATIONS 05/16/2009   HEART MURMUR, HX OF 05/26/2009   Depressive disorder, not elsewhere classified 11/06/2010   Lymphocytic colitis 12/01/2011   EBV infection 04/21/2012   Chronic pain syndrome 06/21/2012   Fibromyalgia 06/21/2012   Carpal tunnel syndrome, bilateral 06/21/2012   Degenerative disc disease, cervical 06/21/2012   Facet arthropathy, cervical 06/21/2012   DDD (degenerative disc disease), lumbosacral 06/21/2012   Lower abdominal pain 07/26/2012   Interstitial cystitis (chronic) with hematuria 04/13/2013   Primary osteoarthritis of right knee 12/20/2013   Incontinence in female 06/26/2014   Chronic bilateral lower abdominal pain 06/26/2014   Pain in joint, ankle and foot  04/21/2015   Status post implantation of urinary electronic stimulator device 05/05/2015   Post-menopausal 06/20/2015   Family history of cardiovascular disease 06/20/2015   Drug induced constipation 06/20/2015   No energy 06/20/2015   Dysuria 06/20/2015   Hiatal hernia 07/16/2015   Depression 09/03/2015   Primary osteoarthritis of left hand 02/27/2016   Breast pain, right 09/14/2016   Abnormal weight gain 03/16/2017   BMI 27.0-27.9,adult 03/16/2017   History of gastric ulcer 04/09/2019   RLS (restless legs syndrome) 04/09/2019   Mild episode of recurrent major depressive disorder ([place]) 03/17/2021   Stuttering 03/17/2021   Elevated LDL cholesterol level 03/17/2021   Word finding difficulty 03/17/2021   Resolved Ambulatory Problems    Diagnosis Date Noted   Other change of lacrimal passages 03/01/2010   URI 08/17/2008   STONE, URINARY CALCULUS,UNSPEC. 12/29/2008   FOOT PAIN, LEFT 04/14/2010   Diarrhea 11/04/2009   CONTUSION, LEFT HAND 05/10/2009   CHEMICAL BURN 04/14/2010   Nephrolithiasis 41/32/4401   Bacterial folliculitis 02/72/5366   Acute pharyngitis 01/30/2011   BRONCHITIS, ACUTE 02/04/2011   ABDOMINAL PAIN, ACUTE 02/04/2011   Past Medical History:  Diagnosis Date   Anxiety    Cancer ([place])    Colitis    Dysrhythmia    Gastric ulcer    Headache(784.0)    Heart murmur    IBS (irritable bowel syndrome)    Interstitial cystitis    MVP (mitral valve prolapse)    Urinary calculus or stone         Review of Systems  All other systems reviewed and are negative.     Objective:   Physical Exam Vitals reviewed.  Constitutional:      Appearance: Normal appearance. She is obese.  HENT:     Head: Normocephalic.  Eyes:     General:        Right eye: No discharge.        Left eye: No discharge.     Extraocular Movements: Extraocular movements intact.     Conjunctiva/sclera: Conjunctivae normal.     Pupils: Pupils are equal, round, and reactive to light.   Neck:     Vascular: No carotid bruit.  Cardiovascular:     Rate and Rhythm: Normal rate and regular rhythm.     Pulses: Normal pulses.  Pulmonary:     Effort: Pulmonary effort is normal.     Breath sounds: Normal breath sounds.  Musculoskeletal:        General: No swelling, tenderness, deformity or signs of injury.     Cervical back: Neck supple. No rigidity or tenderness.     Right lower leg: No edema.     Left lower leg: No edema.     Comments: 5/5 strength of lower and upper ext.  Lymphadenopathy:     Cervical: No cervical adenopathy.  Neurological:     General: No focal deficit present.     Mental Status: She is alert and oriented to person, place, and time.     Cranial Nerves: No cranial nerve deficit.     Sensory: No sensory deficit.     Motor: No weakness.     Coordination: Coordination normal.     Gait: Gait normal.     Deep Tendon Reflexes: Reflexes normal.  Psychiatric:        Mood and Affect: Mood normal.      .. Depression screen [facility] 2/9 03/16/2021 08/29/2020 04/02/2019 04/22/2018 12/12/2017  Decreased Interest 1 2 2  0 2  Down, Depressed, Hopeless 1 2 2 1 2   PHQ - 2 Score 2 4 4 1 4   Altered sleeping 3 1 2 1 1   Tired, decreased energy 1 1 1 1 1   Change in appetite 1 2 1  0 1  Feeling bad or failure about yourself  0 2 2 0 1  Trouble concentrating 0 1 0 0 0  Moving slowly or fidgety/restless 0 1 0 0 0  Suicidal thoughts 0 1 0 0 0  PHQ-9 Score 7 13 10 3 8   Difficult doing work/chores Not difficult at all Somewhat difficult Very difficult - Somewhat difficult   .[name]      Assessment & Plan:  [name] KitchenMarland KitchenUchenna was seen today for follow-up.  Diagnoses and all orders for this visit:  Stuttering -     CT HEAD WO CONTRAST (5MM); Future -     US Carotid Duplex Bilateral; Future  Flu vaccine need -     Flu Vaccine QUAD 33mo+IM (Fluarix, Fluzone & Alfiuria Quad PF)  Mild episode of recurrent major depressive disorder (HCC) -     DULoxetine (CYMBALTA) 30 MG capsule; Take  3 capsules (90 mg total) by mouth daily.  Drug induced constipation -     linaclotide (LINZESS) 290 MCG CAPS capsule; Take 1 capsule (290 mcg total) by mouth daily before breakfast.  RLS (restless legs syndrome) -     pramipexole (MIRAPEX) 0.125 MG tablet; TAKE 1 TABLET AT BEDTIME AS NEEDED FOR RESTLESS LEGS.  Family history of cardiovascular disease -     CT HEAD WO  CONTRAST (5MM); Future  Elevated LDL cholesterol level -     CT HEAD WO CONTRAST (5MM); Future -     US Carotid Duplex Bilateral; Future  Word finding difficulty -     CT HEAD WO CONTRAST (5MM); Future -     US Carotid Duplex Bilateral; Future  Unclear etiology of stuttering/word finding difficultly.  Concern for TIA/stroke. Pt does have family hx of CV disease. Last LDL was 105 in 08/2020. Not on statin.  Will get carotid u/s and CT of head.  No recent medication changes other than added atenolol and stopped elmiron which I do not think would cause this. Could certainly be stress/anxiety related. She feels like this is better with increase in cymbalta. No correlation with starting cymbalta and symptoms.   Consider meditation before bed. No caffeine. Work on managing stress. Uses cpap every night. Follow up if not improving after stroke work up.   Flu shot given today.

## 2021-03-17 NOTE — Patient Instructions (Signed)
Will get CT of head  Will get ultrasound of carotids

## 2021-03-24 ENCOUNTER — Other Ambulatory Visit: Payer: Self-pay

## 2021-03-24 ENCOUNTER — Ambulatory Visit (INDEPENDENT_AMBULATORY_CARE_PROVIDER_SITE_OTHER): Payer: PPO

## 2021-03-24 DIAGNOSIS — Z8249 Family history of ischemic heart disease and other diseases of the circulatory system: Secondary | ICD-10-CM

## 2021-03-24 DIAGNOSIS — I10 Essential (primary) hypertension: Secondary | ICD-10-CM | POA: Diagnosis not present

## 2021-03-24 DIAGNOSIS — F8081 Childhood onset fluency disorder: Secondary | ICD-10-CM

## 2021-03-24 DIAGNOSIS — F985 Adult onset fluency disorder: Secondary | ICD-10-CM | POA: Diagnosis not present

## 2021-03-24 DIAGNOSIS — E785 Hyperlipidemia, unspecified: Secondary | ICD-10-CM | POA: Diagnosis not present

## 2021-03-24 DIAGNOSIS — R4789 Other speech disturbances: Secondary | ICD-10-CM

## 2021-03-24 DIAGNOSIS — E7800 Pure hypercholesterolemia, unspecified: Secondary | ICD-10-CM

## 2021-03-24 DIAGNOSIS — G459 Transient cerebral ischemic attack, unspecified: Secondary | ICD-10-CM | POA: Diagnosis not present

## 2021-03-25 NOTE — Progress Notes (Signed)
No significant plaque accumulation.

## 2021-03-27 ENCOUNTER — Encounter: Payer: Self-pay | Admitting: Physician Assistant

## 2021-03-27 DIAGNOSIS — R21 Rash and other nonspecific skin eruption: Secondary | ICD-10-CM

## 2021-03-30 ENCOUNTER — Other Ambulatory Visit: Payer: Self-pay | Admitting: Physician Assistant

## 2021-03-30 DIAGNOSIS — F8081 Childhood onset fluency disorder: Secondary | ICD-10-CM

## 2021-03-30 DIAGNOSIS — R4789 Other speech disturbances: Secondary | ICD-10-CM

## 2021-03-30 NOTE — Progress Notes (Signed)
MRI ordered

## 2021-04-03 DIAGNOSIS — Z79891 Long term (current) use of opiate analgesic: Secondary | ICD-10-CM | POA: Diagnosis not present

## 2021-04-03 DIAGNOSIS — F1120 Opioid dependence, uncomplicated: Secondary | ICD-10-CM | POA: Diagnosis not present

## 2021-04-03 DIAGNOSIS — Z79899 Other long term (current) drug therapy: Secondary | ICD-10-CM | POA: Diagnosis not present

## 2021-04-03 DIAGNOSIS — M5459 Other low back pain: Secondary | ICD-10-CM | POA: Diagnosis not present

## 2021-04-06 ENCOUNTER — Other Ambulatory Visit: Payer: PPO

## 2021-04-07 DIAGNOSIS — Z79899 Other long term (current) drug therapy: Secondary | ICD-10-CM | POA: Diagnosis not present

## 2021-04-07 NOTE — Telephone Encounter (Signed)
I sent mom a mychart message to let her know we are switching you to her sons PCP. - CF

## 2021-04-21 DIAGNOSIS — R21 Rash and other nonspecific skin eruption: Secondary | ICD-10-CM | POA: Diagnosis not present

## 2021-04-21 LAB — CBC WITH DIFFERENTIAL/PLATELET
Absolute Monocytes: 781 cells/uL (ref 200–950)
Basophils Absolute: 28 cells/uL (ref 0–200)
Basophils Relative: 0.4 %
Eosinophils Absolute: 213 cells/uL (ref 15–500)
Eosinophils Relative: 3 %
HCT: 45.3 % — ABNORMAL HIGH (ref 35.0–45.0)
Hemoglobin: 15.8 g/dL — ABNORMAL HIGH (ref 11.7–15.5)
Lymphs Abs: 2002 cells/uL (ref 850–3900)
MCH: 34.8 pg — ABNORMAL HIGH (ref 27.0–33.0)
MCHC: 34.9 g/dL (ref 32.0–36.0)
MCV: 99.8 fL (ref 80.0–100.0)
MPV: 10.2 fL (ref 7.5–12.5)
Monocytes Relative: 11 %
Neutro Abs: 4075 cells/uL (ref 1500–7800)
Neutrophils Relative %: 57.4 %
Platelets: 253 10*3/uL (ref 140–400)
RBC: 4.54 10*6/uL (ref 3.80–5.10)
RDW: 12.7 % (ref 11.0–15.0)
Total Lymphocyte: 28.2 %
WBC: 7.1 10*3/uL (ref 3.8–10.8)

## 2021-04-22 NOTE — Progress Notes (Signed)
CBC looks good overall. Hemoglobin came down some.

## 2021-05-04 DIAGNOSIS — M5459 Other low back pain: Secondary | ICD-10-CM | POA: Diagnosis not present

## 2021-05-04 DIAGNOSIS — Z79899 Other long term (current) drug therapy: Secondary | ICD-10-CM | POA: Diagnosis not present

## 2021-05-04 DIAGNOSIS — Z79891 Long term (current) use of opiate analgesic: Secondary | ICD-10-CM | POA: Diagnosis not present

## 2021-05-04 DIAGNOSIS — F1120 Opioid dependence, uncomplicated: Secondary | ICD-10-CM | POA: Diagnosis not present

## 2021-05-04 DIAGNOSIS — I10 Essential (primary) hypertension: Secondary | ICD-10-CM | POA: Diagnosis not present

## 2021-05-04 DIAGNOSIS — Z6826 Body mass index (BMI) 26.0-26.9, adult: Secondary | ICD-10-CM | POA: Diagnosis not present

## 2021-05-12 ENCOUNTER — Ambulatory Visit (HOSPITAL_COMMUNITY)
Admission: RE | Admit: 2021-05-12 | Discharge: 2021-05-12 | Disposition: A | Discharge status: Home / Self Care | Payer: PPO | Source: Ambulatory Visit | Attending: Physician Assistant | Admitting: Physician Assistant

## 2021-05-12 ENCOUNTER — Other Ambulatory Visit: Payer: Self-pay

## 2021-05-12 DIAGNOSIS — F8081 Childhood onset fluency disorder: Secondary | ICD-10-CM | POA: Diagnosis not present

## 2021-05-12 DIAGNOSIS — R4789 Other speech disturbances: Secondary | ICD-10-CM | POA: Insufficient documentation

## 2021-05-12 DIAGNOSIS — Q283 Other malformations of cerebral vessels: Secondary | ICD-10-CM | POA: Diagnosis not present

## 2021-05-12 DIAGNOSIS — J320 Chronic maxillary sinusitis: Secondary | ICD-10-CM | POA: Diagnosis not present

## 2021-05-12 DIAGNOSIS — Z8673 Personal history of transient ischemic attack (TIA), and cerebral infarction without residual deficits: Secondary | ICD-10-CM | POA: Diagnosis not present

## 2021-05-12 MED ORDER — GADOBUTROL 1 MMOL/ML IV SOLN
7.0000 mL | Freq: Once | INTRAVENOUS | Status: AC | PRN
  Administered 2021-05-12: 7 mL via INTRAVENOUS

## 2021-05-15 ENCOUNTER — Encounter: Payer: Self-pay | Admitting: Physician Assistant

## 2021-05-15 ENCOUNTER — Other Ambulatory Visit: Payer: Self-pay | Admitting: Physician Assistant

## 2021-05-15 DIAGNOSIS — R9082 White matter disease, unspecified: Secondary | ICD-10-CM

## 2021-05-15 DIAGNOSIS — R930 Abnormal findings on diagnostic imaging of skull and head, not elsewhere classified: Secondary | ICD-10-CM

## 2021-05-15 DIAGNOSIS — F330 Major depressive disorder, recurrent, mild: Secondary | ICD-10-CM

## 2021-05-15 DIAGNOSIS — R4789 Other speech disturbances: Secondary | ICD-10-CM

## 2021-05-15 DIAGNOSIS — G2581 Restless legs syndrome: Secondary | ICD-10-CM

## 2021-05-15 DIAGNOSIS — F8081 Childhood onset fluency disorder: Secondary | ICD-10-CM

## 2021-05-15 MED ORDER — AMOXICILLIN-POT CLAVULANATE 875-125 MG PO TABS: 1.0000 | ORAL_TABLET | Freq: Two times a day (BID) | ORAL | 0 refills | Status: DC

## 2021-05-15 NOTE — Progress Notes (Signed)
MRI does show sinusitis. Sent augmentin for 10 days to treat.   You do have a few areas of hyperintense insults which looks like could represent some tiny infarcts. I would like for a neurologist to look at this. Do you have any preference?

## 2021-05-19 DIAGNOSIS — R930 Abnormal findings on diagnostic imaging of skull and head, not elsewhere classified: Secondary | ICD-10-CM | POA: Insufficient documentation

## 2021-05-29 ENCOUNTER — Other Ambulatory Visit: Payer: Self-pay | Admitting: Physician Assistant

## 2021-05-29 DIAGNOSIS — F330 Major depressive disorder, recurrent, mild: Secondary | ICD-10-CM

## 2021-05-29 DIAGNOSIS — Z8711 Personal history of peptic ulcer disease: Secondary | ICD-10-CM

## 2021-05-29 DIAGNOSIS — G2581 Restless legs syndrome: Secondary | ICD-10-CM

## 2021-05-29 DIAGNOSIS — K449 Diaphragmatic hernia without obstruction or gangrene: Secondary | ICD-10-CM

## 2021-06-01 MED ORDER — PRAMIPEXOLE DIHYDROCHLORIDE 0.125 MG PO TABS: ORAL_TABLET | ORAL | 1 refills | Status: DC

## 2021-06-01 MED ORDER — DULOXETINE HCL 30 MG PO CPEP: 90.0000 mg | ORAL_CAPSULE | Freq: Every day | ORAL | 1 refills | Status: DC

## 2021-06-01 NOTE — Addendum Note (Signed)
Addended by: Narda Rutherford on: 06/01/2021 01:56 PM   Modules accepted: Orders

## 2021-06-01 NOTE — Telephone Encounter (Signed)
Thank you :)

## 2021-06-03 DIAGNOSIS — R059 Cough, unspecified: Secondary | ICD-10-CM | POA: Diagnosis not present

## 2021-06-03 DIAGNOSIS — R110 Nausea: Secondary | ICD-10-CM | POA: Diagnosis not present

## 2021-06-09 ENCOUNTER — Emergency Department (HOSPITAL_BASED_OUTPATIENT_CLINIC_OR_DEPARTMENT_OTHER): Payer: PPO

## 2021-06-09 ENCOUNTER — Other Ambulatory Visit: Payer: Self-pay

## 2021-06-09 ENCOUNTER — Telehealth (INDEPENDENT_AMBULATORY_CARE_PROVIDER_SITE_OTHER): Payer: PPO | Admitting: Physician Assistant

## 2021-06-09 ENCOUNTER — Encounter (HOSPITAL_BASED_OUTPATIENT_CLINIC_OR_DEPARTMENT_OTHER): Payer: Self-pay | Admitting: *Deleted

## 2021-06-09 ENCOUNTER — Inpatient Hospital Stay (HOSPITAL_BASED_OUTPATIENT_CLINIC_OR_DEPARTMENT_OTHER)
Admission: EM | Admit: 2021-06-09 | Discharge: 2021-06-11 | DRG: 689 | Disposition: A | Discharge status: Home / Self Care | Payer: PPO | Attending: Internal Medicine | Admitting: Internal Medicine

## 2021-06-09 ENCOUNTER — Telehealth: Payer: Self-pay

## 2021-06-09 ENCOUNTER — Encounter: Payer: Self-pay | Admitting: Physician Assistant

## 2021-06-09 VITALS — Temp 101.0°F | Ht 66.0 in | Wt 160.0 lb

## 2021-06-09 DIAGNOSIS — R630 Anorexia: Secondary | ICD-10-CM

## 2021-06-09 DIAGNOSIS — J40 Bronchitis, not specified as acute or chronic: Secondary | ICD-10-CM

## 2021-06-09 DIAGNOSIS — E8809 Other disorders of plasma-protein metabolism, not elsewhere classified: Secondary | ICD-10-CM | POA: Diagnosis not present

## 2021-06-09 DIAGNOSIS — Z90711 Acquired absence of uterus with remaining cervical stump: Secondary | ICD-10-CM

## 2021-06-09 DIAGNOSIS — G894 Chronic pain syndrome: Secondary | ICD-10-CM | POA: Diagnosis present

## 2021-06-09 DIAGNOSIS — M5137 Other intervertebral disc degeneration, lumbosacral region: Secondary | ICD-10-CM | POA: Diagnosis present

## 2021-06-09 DIAGNOSIS — F1190 Opioid use, unspecified, uncomplicated: Secondary | ICD-10-CM | POA: Diagnosis present

## 2021-06-09 DIAGNOSIS — F17200 Nicotine dependence, unspecified, uncomplicated: Secondary | ICD-10-CM | POA: Diagnosis not present

## 2021-06-09 DIAGNOSIS — F32A Depression, unspecified: Secondary | ICD-10-CM | POA: Diagnosis present

## 2021-06-09 DIAGNOSIS — N12 Tubulo-interstitial nephritis, not specified as acute or chronic: Secondary | ICD-10-CM | POA: Diagnosis not present

## 2021-06-09 DIAGNOSIS — Z9851 Tubal ligation status: Secondary | ICD-10-CM

## 2021-06-09 DIAGNOSIS — R531 Weakness: Secondary | ICD-10-CM

## 2021-06-09 DIAGNOSIS — R627 Adult failure to thrive: Secondary | ICD-10-CM | POA: Diagnosis not present

## 2021-06-09 DIAGNOSIS — R6881 Early satiety: Secondary | ICD-10-CM | POA: Diagnosis not present

## 2021-06-09 DIAGNOSIS — Z7952 Long term (current) use of systemic steroids: Secondary | ICD-10-CM

## 2021-06-09 DIAGNOSIS — E860 Dehydration: Secondary | ICD-10-CM | POA: Diagnosis present

## 2021-06-09 DIAGNOSIS — Z79899 Other long term (current) drug therapy: Secondary | ICD-10-CM | POA: Diagnosis not present

## 2021-06-09 DIAGNOSIS — R0602 Shortness of breath: Secondary | ICD-10-CM | POA: Diagnosis not present

## 2021-06-09 DIAGNOSIS — K589 Irritable bowel syndrome without diarrhea: Secondary | ICD-10-CM | POA: Diagnosis not present

## 2021-06-09 DIAGNOSIS — N390 Urinary tract infection, site not specified: Secondary | ICD-10-CM | POA: Diagnosis present

## 2021-06-09 DIAGNOSIS — Z20822 Contact with and (suspected) exposure to covid-19: Secondary | ICD-10-CM | POA: Diagnosis present

## 2021-06-09 DIAGNOSIS — Z9049 Acquired absence of other specified parts of digestive tract: Secondary | ICD-10-CM | POA: Diagnosis not present

## 2021-06-09 DIAGNOSIS — N3011 Interstitial cystitis (chronic) with hematuria: Secondary | ICD-10-CM | POA: Diagnosis not present

## 2021-06-09 DIAGNOSIS — K859 Acute pancreatitis without necrosis or infection, unspecified: Secondary | ICD-10-CM | POA: Diagnosis present

## 2021-06-09 DIAGNOSIS — Z960 Presence of urogenital implants: Secondary | ICD-10-CM

## 2021-06-09 DIAGNOSIS — G2581 Restless legs syndrome: Secondary | ICD-10-CM | POA: Diagnosis not present

## 2021-06-09 DIAGNOSIS — M1711 Unilateral primary osteoarthritis, right knee: Secondary | ICD-10-CM | POA: Diagnosis not present

## 2021-06-09 DIAGNOSIS — Z79891 Long term (current) use of opiate analgesic: Secondary | ICD-10-CM

## 2021-06-09 DIAGNOSIS — Z882 Allergy status to sulfonamides status: Secondary | ICD-10-CM

## 2021-06-09 DIAGNOSIS — M5030 Other cervical disc degeneration, unspecified cervical region: Secondary | ICD-10-CM | POA: Diagnosis not present

## 2021-06-09 DIAGNOSIS — F17210 Nicotine dependence, cigarettes, uncomplicated: Secondary | ICD-10-CM | POA: Diagnosis not present

## 2021-06-09 DIAGNOSIS — Z6825 Body mass index (BMI) 25.0-25.9, adult: Secondary | ICD-10-CM

## 2021-06-09 DIAGNOSIS — J329 Chronic sinusitis, unspecified: Secondary | ICD-10-CM

## 2021-06-09 DIAGNOSIS — M19042 Primary osteoarthritis, left hand: Secondary | ICD-10-CM | POA: Diagnosis present

## 2021-06-09 DIAGNOSIS — E871 Hypo-osmolality and hyponatremia: Secondary | ICD-10-CM | POA: Diagnosis not present

## 2021-06-09 DIAGNOSIS — F419 Anxiety disorder, unspecified: Secondary | ICD-10-CM | POA: Diagnosis not present

## 2021-06-09 DIAGNOSIS — R1110 Vomiting, unspecified: Secondary | ICD-10-CM | POA: Diagnosis not present

## 2021-06-09 DIAGNOSIS — Z888 Allergy status to other drugs, medicaments and biological substances status: Secondary | ICD-10-CM

## 2021-06-09 LAB — URINALYSIS, MICROSCOPIC (REFLEX)

## 2021-06-09 LAB — COMPREHENSIVE METABOLIC PANEL
ALT: 38 U/L (ref 0–44)
AST: 30 U/L (ref 15–41)
Albumin: 2.7 g/dL — ABNORMAL LOW (ref 3.5–5.0)
Alkaline Phosphatase: 191 U/L — ABNORMAL HIGH (ref 38–126)
Anion gap: 14 (ref 5–15)
BUN: 19 mg/dL (ref 6–20)
CO2: 24 mmol/L (ref 22–32)
Calcium: 8.9 mg/dL (ref 8.9–10.3)
Chloride: 90 mmol/L — ABNORMAL LOW (ref 98–111)
Creatinine, Ser: 0.86 mg/dL (ref 0.44–1.00)
GFR, Estimated: >60 mL/min (ref >60)
Glucose, Bld: 133 mg/dL — ABNORMAL HIGH (ref 70–99)
Potassium: 3.2 mmol/L — ABNORMAL LOW (ref 3.5–5.1)
Sodium: 128 mmol/L — ABNORMAL LOW (ref 135–145)
Total Bilirubin: 0.8 mg/dL (ref 0.3–1.2)
Total Protein: 6.9 g/dL (ref 6.5–8.1)

## 2021-06-09 LAB — URINALYSIS, ROUTINE W REFLEX MICROSCOPIC
Glucose, UA: NEGATIVE mg/dL
Ketones, ur: 40 mg/dL — AB
Leukocytes,Ua: NEGATIVE
Nitrite: NEGATIVE
Protein, ur: 30 mg/dL — AB
Specific Gravity, Urine: 1.01 (ref 1.005–1.030)
pH: 6 (ref 5.0–8.0)

## 2021-06-09 LAB — CBC WITH DIFFERENTIAL/PLATELET
Abs Immature Granulocytes: 0.07 10*3/uL (ref 0.00–0.07)
Absolute Monocytes: 763 cells/uL (ref 200–950)
Basophils Absolute: 25 cells/uL (ref 0–200)
Basophils Absolute: 0 10*3/uL (ref 0.0–0.1)
Basophils Relative: 0.2 %
Basophils Relative: 0 %
Eosinophils Absolute: 172 cells/uL (ref 15–500)
Eosinophils Absolute: 0 10*3/uL (ref 0.0–0.5)
Eosinophils Relative: 1.4 %
Eosinophils Relative: 0 %
HCT: 43.8 % (ref 35.0–45.0)
HCT: 38.3 % (ref 36.0–46.0)
Hemoglobin: 15 g/dL (ref 11.7–15.5)
Hemoglobin: 13.6 g/dL (ref 12.0–15.0)
Immature Granulocytes: 1 %
Lymphocytes Relative: 6 %
Lymphs Abs: 947 cells/uL (ref 850–3900)
Lymphs Abs: 0.6 10*3/uL — ABNORMAL LOW (ref 0.7–4.0)
MCH: 32.8 pg (ref 27.0–33.0)
MCH: 33.2 pg (ref 26.0–34.0)
MCHC: 34.2 g/dL (ref 32.0–36.0)
MCHC: 35.5 g/dL (ref 30.0–36.0)
MCV: 95.8 fL (ref 80.0–100.0)
MCV: 93.4 fL (ref 80.0–100.0)
MPV: 9.9 fL (ref 7.5–12.5)
Monocytes Absolute: 0.2 10*3/uL (ref 0.1–1.0)
Monocytes Relative: 6.2 %
Monocytes Relative: 2 %
Neutro Abs: 10394 cells/uL — ABNORMAL HIGH (ref 1500–7800)
Neutro Abs: 10.1 10*3/uL — ABNORMAL HIGH (ref 1.7–7.7)
Neutrophils Relative %: 84.5 %
Neutrophils Relative %: 91 %
Platelets: 396 10*3/uL (ref 140–400)
Platelets: 352 10*3/uL (ref 150–400)
RBC: 4.57 10*6/uL (ref 3.80–5.10)
RBC: 4.1 MIL/uL (ref 3.87–5.11)
RDW: 12.7 % (ref 11.0–15.0)
RDW: 13.3 % (ref 11.5–15.5)
Total Lymphocyte: 7.7 %
WBC: 12.3 10*3/uL — ABNORMAL HIGH (ref 3.8–10.8)
WBC: 11 10*3/uL — ABNORMAL HIGH (ref 4.0–10.5)
nRBC: 0 % (ref 0.0–0.2)

## 2021-06-09 LAB — COMPLETE METABOLIC PANEL WITH GFR
AG Ratio: 1.1 (calc) (ref 1.0–2.5)
ALT: 37 U/L — ABNORMAL HIGH (ref 6–29)
AST: 30 U/L (ref 10–35)
Albumin: 3.4 g/dL — ABNORMAL LOW (ref 3.6–5.1)
Alkaline phosphatase (APISO): 244 U/L — ABNORMAL HIGH (ref 37–153)
BUN: 19 mg/dL (ref 7–25)
CO2: 28 mmol/L (ref 20–32)
Calcium: 9.4 mg/dL (ref 8.6–10.4)
Chloride: 91 mmol/L — ABNORMAL LOW (ref 98–110)
Creat: 0.78 mg/dL (ref 0.50–1.03)
Globulin: 3.1 g/dL (calc) (ref 1.9–3.7)
Glucose, Bld: 82 mg/dL (ref 65–99)
Potassium: 3.5 mmol/L (ref 3.5–5.3)
Sodium: 134 mmol/L — ABNORMAL LOW (ref 135–146)
Total Bilirubin: 0.7 mg/dL (ref 0.2–1.2)
Total Protein: 6.5 g/dL (ref 6.1–8.1)
eGFR: 89 mL/min/{1.73_m2} (ref >=60)

## 2021-06-09 LAB — LIPASE: Lipase: 133 U/L — ABNORMAL HIGH (ref 7–60)

## 2021-06-09 LAB — LIPASE, BLOOD: Lipase: 97 U/L — ABNORMAL HIGH (ref 11–51)

## 2021-06-09 LAB — D-DIMER, QUANTITATIVE: D-Dimer, Quant: 1.12 mcg/mL FEU — ABNORMAL HIGH (ref <0.50)

## 2021-06-09 MED ORDER — LACTATED RINGERS IV SOLN
INTRAVENOUS | Status: DC
Start: 2021-06-09 — End: 2021-06-11

## 2021-06-09 MED ORDER — MAGNESIUM OXIDE -MG SUPPLEMENT 400 (240 MG) MG PO TABS
800.0000 mg | ORAL_TABLET | Freq: Once | ORAL | Status: AC
  Administered 2021-06-09: 800 mg via ORAL
  Filled 2021-06-09: qty 2

## 2021-06-09 MED ORDER — SODIUM CHLORIDE 0.9 % IV SOLN
1.0000 g | Freq: Once | INTRAVENOUS | Status: AC
  Administered 2021-06-09: 23:00:00 1 g via INTRAVENOUS
  Filled 2021-06-09: qty 10

## 2021-06-09 MED ORDER — POTASSIUM CHLORIDE CRYS ER 20 MEQ PO TBCR
40.0000 meq | EXTENDED_RELEASE_TABLET | Freq: Once | ORAL | Status: AC
  Administered 2021-06-09: 40 meq via ORAL
  Filled 2021-06-09: qty 2

## 2021-06-09 MED ORDER — PREDNISONE 50 MG PO TABS: ORAL_TABLET | ORAL | 0 refills | Status: DC

## 2021-06-09 MED ORDER — ALBUTEROL SULFATE (2.5 MG/3ML) 0.083% IN NEBU: 2.5000 mg | INHALATION_SOLUTION | RESPIRATORY_TRACT | 0 refills | Status: DC | PRN

## 2021-06-09 MED ORDER — DOXYCYCLINE HYCLATE 100 MG PO TABS: 100.0000 mg | ORAL_TABLET | Freq: Two times a day (BID) | ORAL | 0 refills | Status: DC

## 2021-06-09 MED ORDER — IOHEXOL 300 MG/ML  SOLN
100.0000 mL | Freq: Once | INTRAMUSCULAR | Status: AC | PRN
  Administered 2021-06-09: 22:00:00 100 mL via INTRAVENOUS

## 2021-06-09 NOTE — Plan of Care (Addendum)
[facility] transfer note:  55 yo F, self cath's chronically with pyelonephritis.  Slight lipase elevation but no pancreatitis on CT.  Not tolerating POs.  Rocephin, IVF.  Cultures sent.  Med surg bed.  [facility] will assume care on arrival to accepting facility. Until arrival, care as per EDP. However, [facility] available 24/7 for questions and assistance.  Nursing staff, please page [place] and Consults [phone]) as soon as the patient arrives the hospital.

## 2021-06-09 NOTE — ED Notes (Signed)
Pt requests that s/o straight catheterize her, as that is how she voids at home.  S/o agreeable to this plan, pt provided with straight cath kit.

## 2021-06-09 NOTE — Progress Notes (Signed)
..Virtual Visit via Telephone Note  I connected with [name] on 06/09/21 at  9:50 AM EST by telephone and verified that I am speaking with the correct person using two identifiers.  Location: Patient: home Provider:  clinic  .[name] KitchenParticipating in visit:  Patient: [name] Provider: Iran Planas PA-c   I discussed the limitations, risks, security and privacy concerns of performing an evaluation and management service by telephone and the availability of in person appointments. I also discussed with the patient that there may be a patient responsible charge related to this service. The patient expressed understanding and agreed to proceed.   History of Present Illness: Pt is a 55 yo female with 14 days of cough, fever, weakness, fatigue, vomiting, diarrhea, no appetite.she tested negative for covid on 12/21. Pt's vomiting and diarrhea has stopped but she is still coughing up clear to bloody mucus and exteremly weak and fatigued. No new medication changes. Fever 101 this morning. No other sick contact. No urinary symptoms but does have some low back pain. No leg pain or swelling.   .. Active Ambulatory Problems    Diagnosis Date Noted   TOBACCO ABUSE 05/28/2009   MITRAL VALVE PROLAPSE 05/28/2009   UNSPECIFIED CARDIAC DYSRHYTHMIA 05/16/2009   TMJ PAIN 07/26/2008   GASTRIC ULCER 05/26/2009   NEPHROLITHIASIS 03/06/2010   HEMATURIA UNSPECIFIED 12/19/2008   OVARIAN CYST 03/06/2010   PALPITATIONS 05/16/2009   HEART MURMUR, HX OF 05/26/2009   Depressive disorder, not elsewhere classified 11/06/2010   Lymphocytic colitis 12/01/2011   EBV infection 04/21/2012   Chronic pain syndrome 06/21/2012   Fibromyalgia 06/21/2012   Carpal tunnel syndrome, bilateral 06/21/2012   Degenerative disc disease, cervical 06/21/2012   Facet arthropathy, cervical 06/21/2012   DDD (degenerative disc disease), lumbosacral 06/21/2012   Lower abdominal pain 07/26/2012   Interstitial cystitis (chronic) with  hematuria 04/13/2013   Primary osteoarthritis of right knee 12/20/2013   Incontinence in female 06/26/2014   Chronic bilateral lower abdominal pain 06/26/2014   Pain in joint, ankle and foot 04/21/2015   Status post implantation of urinary electronic stimulator device 05/05/2015   Post-menopausal 06/20/2015   Family history of cardiovascular disease 06/20/2015   Drug induced constipation 06/20/2015   No energy 06/20/2015   Dysuria 06/20/2015   Hiatal hernia 07/16/2015   Depression 09/03/2015   Primary osteoarthritis of left hand 02/27/2016   Breast pain, right 09/14/2016   Abnormal weight gain 03/16/2017   BMI 27.0-27.9,adult 03/16/2017   History of gastric ulcer 04/09/2019   RLS (restless legs syndrome) 04/09/2019   Mild episode of recurrent major depressive disorder ([place]) 03/17/2021   Stuttering 03/17/2021   Elevated LDL cholesterol level 03/17/2021   Word finding difficulty 03/17/2021   Sleep talking 03/17/2021   Abnormal MRI of head 05/19/2021   Resolved Ambulatory Problems    Diagnosis Date Noted   Other change of lacrimal passages 03/01/2010   URI 08/17/2008   STONE, URINARY CALCULUS,UNSPEC. 12/29/2008   FOOT PAIN, LEFT 04/14/2010   Diarrhea 11/04/2009   CONTUSION, LEFT HAND 05/10/2009   CHEMICAL BURN 04/14/2010   Nephrolithiasis 29/51/8841   Bacterial folliculitis 66/11/3014   Acute pharyngitis 01/30/2011   BRONCHITIS, ACUTE 02/04/2011   ABDOMINAL PAIN, ACUTE 02/04/2011   Past Medical History:  Diagnosis Date   Anxiety    Cancer ([place])    Colitis    Dysrhythmia    Gastric ulcer    Headache(784.0)    Heart murmur    IBS (irritable bowel syndrome)    Interstitial cystitis  MVP (mitral valve prolapse)    Urinary calculus or stone       Observations/Objective: No acute distress Normal breathing with no wheezing or signs of distress  .[name] Today's Vitals   06/09/21 1012  Temp: (!) 101 F (38.3 C)  TempSrc: Oral  Weight: 160 lb (72.6 kg)  Height: 5'  6" (1.676 m)   Body mass index is 25.82 kg/m.    Assessment and Plan: [name] KitchenMarland KitchenTracie was seen today for emesis.  Diagnoses and all orders for this visit:  Sinobronchitis -     albuterol (PROVENTIL) (2.5 MG/3ML) 0.083% nebulizer solution; Take 3 mLs (2.5 mg total) by nebulization every 4 (four) hours as needed for wheezing or shortness of breath (please include nebulizer machine, hoses, and mask if needed.). -     doxycycline (VIBRA-TABS) 100 MG tablet; Take 1 tablet (100 mg total) by mouth 2 (two) times daily. -     predniSONE (DELTASONE) 50 MG tablet; Take one tablet daily for 5 days.  Weakness -     CBC with Differential/Platelet -     COMPLETE METABOLIC PANEL WITH GFR -     Lipase -     D-dimer, quantitative  No appetite -     CBC with Differential/Platelet -     COMPLETE METABOLIC PANEL WITH GFR -     Lipase -     D-dimer, quantitative  SOB (shortness of breath) on exertion -     CBC with Differential/Platelet -     COMPLETE METABOLIC PANEL WITH GFR -     D-dimer, quantitative  Going on 14 days of symptoms.  Treated for sinobronchitis with doxycycline, prednisone, albuterol.  Will get labs for weakness and SOB.  Stat D-dimer to consider possible PE.  No leg pain or swelling.  Rest and hydrate.  Follow up in office if symptoms continues.     Follow Up Instructions:    I discussed the assessment and treatment plan with the patient. The patient was provided an opportunity to ask questions and all were answered. The patient agreed with the plan and demonstrated an understanding of the instructions.   The patient was advised to call back or seek an in-person evaluation if the symptoms worsen or if the condition fails to improve as anticipated.  I provided 20 minutes of non-face-to-face time during this encounter.   Iran Planas, PA-C

## 2021-06-09 NOTE — Progress Notes (Signed)
Your WBC is elevated. Your liver enzymes are high. Your lipase is double what it should be. This looks like acute pancreatitis and you need to go to [facility] for further management. They need to watch your labs and look for any blockages causing this. [facility] so [facility] can see your visit today. At the very least you need aggressive hydration.

## 2021-06-09 NOTE — Telephone Encounter (Signed)
Called patient and made appt. It does look like patient was advised to make appt and never called.

## 2021-06-09 NOTE — ED Provider Notes (Signed)
[facility]ERGENCY DEPARTMENT Provider Note   CSN: [id number] Arrival date & time: 06/09/21  1936     History Chief Complaint  Patient presents with   Abdominal Pain    [name] is a 55 y.o. female with PMH interstitial cystitis status post [employer] stimulator placement, depression who presents emergency department for evaluation of early satiety, decreased p.o. intake, dehydration and abnormal labs.  Patient was evaluated by her primary care physician today who obtained outpatient labs showing an elevated lipase and concern for possible pancreatitis.  Patient also with hypoalbuminemia and leukocytosis on outpatient labs.  She currently denies chest pain, shortness of breath, nausea, vomiting, diarrhea or other systemic symptoms.  She states that she has lost 15 pounds over the last month and can tolerate liquids but does not want to eat.   Abdominal Pain Associated symptoms: no chest pain, no chills, no cough, no dysuria, no fever, no hematuria, no shortness of breath, no sore throat and no vomiting       Past Medical History:  Diagnosis Date   Anxiety    Cancer ([place])    cervical   Colitis    lymphacitic   Depression    Dysrhythmia    pvc    Gastric ulcer    Headache(784.0)    Heart murmur    IBS (irritable bowel syndrome)    Interstitial cystitis    MVP (mitral valve prolapse)    Urinary calculus or stone     Patient Active Problem List   Diagnosis Date Noted   Abnormal MRI of head 05/19/2021   Mild episode of recurrent major depressive disorder ([place]) 03/17/2021   Stuttering 03/17/2021   Elevated LDL cholesterol level 03/17/2021   Word finding difficulty 03/17/2021   Sleep talking 03/17/2021   History of gastric ulcer 04/09/2019   RLS (restless legs syndrome) 04/09/2019   Abnormal weight gain 03/16/2017   BMI 27.0-27.9,adult 03/16/2017   Breast pain, right 09/14/2016   Primary osteoarthritis of left hand 02/27/2016   Depression  09/03/2015   Hiatal hernia 07/16/2015   Post-menopausal 06/20/2015   Family history of cardiovascular disease 06/20/2015   Drug induced constipation 06/20/2015   No energy 06/20/2015   Dysuria 06/20/2015   Status post implantation of urinary electronic stimulator device 05/05/2015   Pain in joint, ankle and foot 04/21/2015   Incontinence in female 06/26/2014   Chronic bilateral lower abdominal pain 06/26/2014   Primary osteoarthritis of right knee 12/20/2013   Interstitial cystitis (chronic) with hematuria 04/13/2013   Lower abdominal pain 07/26/2012   Chronic pain syndrome 06/21/2012   Fibromyalgia 06/21/2012   Carpal tunnel syndrome, bilateral 06/21/2012   Degenerative disc disease, cervical 06/21/2012   Facet arthropathy, cervical 06/21/2012   DDD (degenerative disc disease), lumbosacral 06/21/2012   EBV infection 04/21/2012   Lymphocytic colitis 12/01/2011   Depressive disorder, not elsewhere classified 11/06/2010   NEPHROLITHIASIS 03/06/2010   OVARIAN CYST 03/06/2010   TOBACCO ABUSE 05/28/2009   MITRAL VALVE PROLAPSE 05/28/2009   GASTRIC ULCER 05/26/2009   HEART MURMUR, HX OF 05/26/2009   UNSPECIFIED CARDIAC DYSRHYTHMIA 05/16/2009   PALPITATIONS 05/16/2009   HEMATURIA UNSPECIFIED 12/19/2008   TMJ PAIN 07/26/2008    Past Surgical History:  Procedure Laterality Date   APPENDECTOMY  2005   BILATERAL OOPHORECTOMY  2012   DILATION AND CURETTAGE OF UTERUS     x 4  one after each SAB   EXPLORATORY LAPAROTOMY  2007   LAPAROSCOPY N/A 08/04/2012   Procedure: LAPAROSCOPY  DIAGNOSTIC;  Surgeon: Gayland Curry, MD,FACS;  Location: [facility];  Service: General;  Laterality: N/A;   PARTIAL HYSTERECTOMY  2005   TONSILLECTOMY  42 years ago   TUBAL LIGATION  2000     OB History     Gravida  5   Para  2   Term  2   Preterm      AB      Living  2      SAB      IAB      Ectopic      Multiple      Live Births              Family History  Problem Relation Age  of Onset   Heart attack Mother    Cirrhosis Mother    Cancer Father        tongue   Cirrhosis Father    Cancer Paternal Uncle    Colon cancer Neg Hx    Esophageal cancer Neg Hx    Rectal cancer Neg Hx    Stomach cancer Neg Hx    Breast cancer Neg Hx     Social History   Tobacco Use   Smoking status: Every Day    Packs/day: 1.00    Years: 25.00    Pack years: 25.00    Types: Cigarettes   Smokeless tobacco: Never  Vaping Use   Vaping Use: Never used  Substance Use Topics   Alcohol use: Yes    Comment: rarely   Drug use: No    Home Medications Prior to Admission medications   Medication Sig Start Date End Date Taking? Authorizing Provider  albuterol (PROVENTIL) (2.5 MG/3ML) 0.083% nebulizer solution Take 3 mLs (2.5 mg total) by nebulization every 4 (four) hours as needed for wheezing or shortness of breath (please include nebulizer machine, hoses, and mask if needed.). 06/09/21   Breeback, Jade L, PA-C  atenolol (TENORMIN) 50 MG tablet TAKE ONE-HALF TABLET BY MOUTH ONCE DAILY 05/24/16   Lelon Perla, MD  doxycycline (VIBRA-TABS) 100 MG tablet Take 1 tablet (100 mg total) by mouth 2 (two) times daily. 06/09/21   Breeback, Royetta Car, PA-C  DULoxetine (CYMBALTA) 30 MG capsule Take 3 capsules (90 mg total) by mouth daily. 06/01/21   Breeback, Jade L, PA-C  hydrOXYzine (ATARAX/VISTARIL) 25 MG tablet Take 25 mg by mouth 3 (three) times daily as needed.    [provider]  lidocaine (XYLOCAINE) 2 % injection  11/06/20   [provider]  linaclotide Rolan Lipa) 290 MCG CAPS capsule Take 1 capsule (290 mcg total) by mouth daily before breakfast. 03/17/21   Breeback, Jade L, PA-C  morphine (MS CONTIN) 60 MG 12 hr tablet Take 60 mg by mouth.    [provider]  nitrofurantoin (MACRODANTIN) 50 MG capsule Take 50 mg by mouth daily. 03/07/21   [provider]  pantoprazole (PROTONIX) 40 MG tablet Take 1 tablet (40 mg total) by mouth daily. 08/29/20    Breeback, Jade L, PA-C  pramipexole (MIRAPEX) 0.125 MG tablet TAKE 1 TABLET AT BEDTIME AS NEEDED FOR RESTLESS LEGS. 06/01/21   Breeback, Jade L, PA-C  predniSONE (DELTASONE) 50 MG tablet Take one tablet daily for 5 days. 06/09/21   Breeback, Royetta Car, PA-C  promethazine (PHENERGAN) 25 MG tablet Take one tablet daily as needed for nausea. 03/02/21   Breeback, Royetta Car, PA-C    Allergies    Gabapentin, Lyrica [pregabalin], Ambien [zolpidem], Amitriptyline, and Bactrim [sulfamethoxazole-trimethoprim]  Review of Systems   Review of Systems  Constitutional:  Positive for unexpected weight change. Negative for chills and fever.  HENT:  Negative for ear pain and sore throat.   Eyes:  Negative for pain and visual disturbance.  Respiratory:  Negative for cough and shortness of breath.   Cardiovascular:  Negative for chest pain and palpitations.  Gastrointestinal:  Positive for abdominal pain. Negative for vomiting.  Genitourinary:  Negative for dysuria and hematuria.  Musculoskeletal:  Negative for arthralgias and back pain.  Skin:  Negative for color change and rash.  Neurological:  Negative for seizures and syncope.  All other systems reviewed and are negative.  Physical Exam Updated Vital Signs BP 106/71 (BP Location: Right Arm)    Pulse 71    Temp 98.8 F (37.1 C) (Oral)    Resp 14    Ht $R'5\' 6"'rC$  (1.676 m)    Wt 72.6 kg    SpO2 95%    BMI 25.83 kg/m   Physical Exam Vitals and nursing note reviewed.  Constitutional:      General: She is not in acute distress.    Appearance: She is well-developed. She is ill-appearing.  HENT:     Head: Normocephalic and atraumatic.  Eyes:     Conjunctiva/sclera: Conjunctivae normal.  Cardiovascular:     Rate and Rhythm: Normal rate and regular rhythm.     Heart sounds: No murmur heard. Pulmonary:     Effort: Pulmonary effort is normal. No respiratory distress.     Breath sounds: Normal breath sounds.  Abdominal:     Palpations: Abdomen is soft.      Tenderness: There is no abdominal tenderness.  Musculoskeletal:        General: No swelling.     Cervical back: Neck supple.  Skin:    General: Skin is warm and dry.     Capillary Refill: Capillary refill takes less than 2 seconds.  Neurological:     Mental Status: She is alert.  Psychiatric:        Mood and Affect: Mood normal.    ED Results / Procedures / Treatments   Labs (all labs ordered are listed, but only abnormal results are displayed) Labs Reviewed  CBC WITH DIFFERENTIAL/PLATELET - Abnormal; Notable for the following components:      Result Value   WBC 11.0 (*)    Neutro Abs 10.1 (*)    Lymphs Abs 0.6 (*)    All other components within normal limits  COMPREHENSIVE METABOLIC PANEL - Abnormal; Notable for the following components:   Sodium 128 (*)    Potassium 3.2 (*)    Chloride 90 (*)    Glucose, Bld 133 (*)    Albumin 2.7 (*)    Alkaline Phosphatase 191 (*)    All other components within normal limits  LIPASE, BLOOD - Abnormal; Notable for the following components:   Lipase 97 (*)    All other components within normal limits  RESP PANEL BY RT-PCR (FLU A&B, COVID) ARPGX2  URINALYSIS, ROUTINE W REFLEX MICROSCOPIC    EKG None  Radiology CT ABDOMEN PELVIS W CONTRAST  Result Date: 06/09/2021 CLINICAL DATA:  Suspected pancreatitis. Patient is not eaten in 12 days. Nausea, vomiting, shortness of breath, and weakness. EXAM: CT ABDOMEN AND PELVIS WITH CONTRAST TECHNIQUE: Multidetector CT imaging of the abdomen and pelvis was performed using the standard protocol following bolus administration of intravenous contrast. CONTRAST:  113mL OMNIPAQUE IOHEXOL 300 MG/ML  SOLN COMPARISON:  04/15/2012 FINDINGS: Lower  chest: Mild dependent changes in the lung bases. Hepatobiliary: Mild diffuse fatty infiltration of the liver. No focal liver lesions. Gallbladder is normal. Mild intrahepatic and extrahepatic bile duct dilatation. Cause is not identified. Pancreas: Unremarkable. No  pancreatic ductal dilatation or surrounding inflammatory changes. Spleen: Normal in size without focal abnormality. Adrenals/Urinary Tract: No adrenal gland nodules. 5 mm stone in the lower pole of the left kidney. No hydronephrosis. Benign-appearing cyst in the left kidney, unchanged. The left renal nephrogram is heterogeneous with delayed appearance of contrast material. No ureteral stones are demonstrated. Changes likely represent pyelonephritis. The bladder is decompressed with gas in the bladder. Bladder gas could arise from instrumentation or cystitis. Stomach/Bowel: Stomach, small bowel, and colon are not abnormally distended. Scattered stool throughout the colon. Appendix is not identified. Vascular/Lymphatic: No significant vascular findings are present. No enlarged abdominal or pelvic lymph nodes. Reproductive: Status post hysterectomy. No adnexal masses. Other: No free air or free fluid in the abdomen. Abdominal wall musculature appears intact. Musculoskeletal: No acute or significant osseous findings. Spinal stimulator lead tips in the sciatic region. Generator pack in the soft tissues over the left gluteal region. IMPRESSION: 1. Asymmetric appearance of the left renal nephrogram without evidence of obstruction, likely pyelonephritis. 2. Gas in the bladder may result from cystitis or instrumentation. 3. Intra and extrahepatic bile duct dilatation without cause identified. Consider further evaluation with MRCP. Fatty infiltration of the liver. No gallstones identified. 4. No evidence of acute pancreatitis. Electronically Signed   By: Lucienne Capers M.D.   On: 06/09/2021 22:13    Procedures Procedures   Medications Ordered in ED Medications  cefTRIAXone (ROCEPHIN) 1 g in sodium chloride 0.9 % 100 mL IVPB (has no administration in time range)  lactated ringers infusion (has no administration in time range)  iohexol (OMNIPAQUE) 300 MG/ML solution 100 mL (100 mLs Intravenous Contrast Given 06/09/21  2143)    ED Course  I have reviewed the triage vital signs and the nursing notes.  Pertinent labs & imaging results that were available during my care of the patient were reviewed by me and considered in my medical decision making (see chart for details).    MDM Rules/Calculators/A&P                          Patient seen the emergency department for evaluation of weight loss and abnormal labs.  Physical exam reveals an ill-appearing cachectic patient with mild generalized abdominal tenderness.  Laboratory evaluation with leukocytosis to 11.0, lipase elevated to 97, patient has a hyponatremia to 128, hypokalemia to 3.2, hypochloremia to 90, hypoalbuminemia to 2.7.  Labs concerning for malnutrition.  CT abdomen pelvis obtained to rule out underlying pancreatic malignancy as the cause of her early satiety which is reassuringly negative for this finding but does show evidence of pyelonephritis.  There is gas in the bladder which is due to the patient's daily self-catheterization.  There is also intra and extrahepatic biliary dilatation with no clear source of obstruction.  Patient's LFTs and bili are totally normal with only very mild elevation in her alk phos to 191.  Patient will require admission for pancreatitis and pyelonephritis as well as malnutrition.  Patient then admitted.   Final Clinical Impression(s) / ED Diagnoses Final diagnoses:  Pyelonephritis  Acute pancreatitis, unspecified complication status, unspecified pancreatitis type    Rx / DC Orders ED Discharge Orders     None        Cloa Bushong, MD  06/09/21 2237 ° °

## 2021-06-09 NOTE — Progress Notes (Signed)
Also your D-dimer is very high which can mean a blood clot. You need more imaging and the best place to get this is ED.

## 2021-06-09 NOTE — ED Triage Notes (Addendum)
Abdominal pain. States she has not eaten in 12 days. Her MD called and told her to go to the ER for possible pancreatitis. Abnormal lab work. Pt states her lipase was double. Fever. Neg flu and negative Covid 5 days ago.

## 2021-06-09 NOTE — Telephone Encounter (Signed)
Medication: albuterol (PROVENTIL) (2.5 MG/3ML) 0.083% nebulizer solution Prior authorization submitted via CoverMyMeds on 06/09/2021 [place] submission pending

## 2021-06-10 DIAGNOSIS — N3011 Interstitial cystitis (chronic) with hematuria: Principal | ICD-10-CM

## 2021-06-10 DIAGNOSIS — Z20822 Contact with and (suspected) exposure to covid-19: Secondary | ICD-10-CM | POA: Diagnosis present

## 2021-06-10 DIAGNOSIS — F1190 Opioid use, unspecified, uncomplicated: Secondary | ICD-10-CM

## 2021-06-10 DIAGNOSIS — R630 Anorexia: Secondary | ICD-10-CM | POA: Diagnosis not present

## 2021-06-10 DIAGNOSIS — Z960 Presence of urogenital implants: Secondary | ICD-10-CM

## 2021-06-10 DIAGNOSIS — Z79899 Other long term (current) drug therapy: Secondary | ICD-10-CM | POA: Diagnosis not present

## 2021-06-10 DIAGNOSIS — G894 Chronic pain syndrome: Secondary | ICD-10-CM

## 2021-06-10 DIAGNOSIS — F32A Depression, unspecified: Secondary | ICD-10-CM | POA: Diagnosis present

## 2021-06-10 DIAGNOSIS — E860 Dehydration: Secondary | ICD-10-CM | POA: Diagnosis present

## 2021-06-10 DIAGNOSIS — Z9049 Acquired absence of other specified parts of digestive tract: Secondary | ICD-10-CM | POA: Diagnosis not present

## 2021-06-10 DIAGNOSIS — E871 Hypo-osmolality and hyponatremia: Secondary | ICD-10-CM | POA: Diagnosis not present

## 2021-06-10 DIAGNOSIS — E8809 Other disorders of plasma-protein metabolism, not elsewhere classified: Secondary | ICD-10-CM | POA: Diagnosis present

## 2021-06-10 DIAGNOSIS — M5137 Other intervertebral disc degeneration, lumbosacral region: Secondary | ICD-10-CM | POA: Diagnosis present

## 2021-06-10 DIAGNOSIS — R627 Adult failure to thrive: Secondary | ICD-10-CM | POA: Diagnosis present

## 2021-06-10 DIAGNOSIS — M1711 Unilateral primary osteoarthritis, right knee: Secondary | ICD-10-CM | POA: Diagnosis present

## 2021-06-10 DIAGNOSIS — G2581 Restless legs syndrome: Secondary | ICD-10-CM | POA: Diagnosis present

## 2021-06-10 DIAGNOSIS — N390 Urinary tract infection, site not specified: Secondary | ICD-10-CM

## 2021-06-10 DIAGNOSIS — K589 Irritable bowel syndrome without diarrhea: Secondary | ICD-10-CM | POA: Diagnosis present

## 2021-06-10 DIAGNOSIS — N12 Tubulo-interstitial nephritis, not specified as acute or chronic: Secondary | ICD-10-CM | POA: Diagnosis present

## 2021-06-10 DIAGNOSIS — F419 Anxiety disorder, unspecified: Secondary | ICD-10-CM | POA: Diagnosis present

## 2021-06-10 DIAGNOSIS — F17210 Nicotine dependence, cigarettes, uncomplicated: Secondary | ICD-10-CM | POA: Diagnosis present

## 2021-06-10 DIAGNOSIS — Z9851 Tubal ligation status: Secondary | ICD-10-CM | POA: Diagnosis not present

## 2021-06-10 DIAGNOSIS — R6881 Early satiety: Secondary | ICD-10-CM | POA: Diagnosis present

## 2021-06-10 DIAGNOSIS — Z90711 Acquired absence of uterus with remaining cervical stump: Secondary | ICD-10-CM | POA: Diagnosis not present

## 2021-06-10 DIAGNOSIS — M5030 Other cervical disc degeneration, unspecified cervical region: Secondary | ICD-10-CM | POA: Diagnosis present

## 2021-06-10 DIAGNOSIS — K859 Acute pancreatitis without necrosis or infection, unspecified: Secondary | ICD-10-CM | POA: Diagnosis present

## 2021-06-10 DIAGNOSIS — M19042 Primary osteoarthritis, left hand: Secondary | ICD-10-CM | POA: Diagnosis present

## 2021-06-10 DIAGNOSIS — Z7952 Long term (current) use of systemic steroids: Secondary | ICD-10-CM | POA: Diagnosis not present

## 2021-06-10 DIAGNOSIS — F17200 Nicotine dependence, unspecified, uncomplicated: Secondary | ICD-10-CM

## 2021-06-10 LAB — COMPREHENSIVE METABOLIC PANEL
ALT: 37 U/L (ref 0–44)
AST: 26 U/L (ref 15–41)
Albumin: 2.9 g/dL — ABNORMAL LOW (ref 3.5–5.0)
Alkaline Phosphatase: 190 U/L — ABNORMAL HIGH (ref 38–126)
Anion gap: 14 (ref 5–15)
BUN: 22 mg/dL — ABNORMAL HIGH (ref 6–20)
CO2: 23 mmol/L (ref 22–32)
Calcium: 9.3 mg/dL (ref 8.9–10.3)
Chloride: 95 mmol/L — ABNORMAL LOW (ref 98–111)
Creatinine, Ser: 0.88 mg/dL (ref 0.44–1.00)
GFR, Estimated: >60 mL/min (ref >60)
Glucose, Bld: 108 mg/dL — ABNORMAL HIGH (ref 70–99)
Potassium: 3.7 mmol/L (ref 3.5–5.1)
Sodium: 132 mmol/L — ABNORMAL LOW (ref 135–145)
Total Bilirubin: 0.8 mg/dL (ref 0.3–1.2)
Total Protein: 7 g/dL (ref 6.5–8.1)

## 2021-06-10 LAB — CBC WITH DIFFERENTIAL/PLATELET
Abs Immature Granulocytes: 0.08 10*3/uL — ABNORMAL HIGH (ref 0.00–0.07)
Basophils Absolute: 0 10*3/uL (ref 0.0–0.1)
Basophils Relative: 0 %
Eosinophils Absolute: 0 10*3/uL (ref 0.0–0.5)
Eosinophils Relative: 0 %
HCT: 39.4 % (ref 36.0–46.0)
Hemoglobin: 13.7 g/dL (ref 12.0–15.0)
Immature Granulocytes: 1 %
Lymphocytes Relative: 8 %
Lymphs Abs: 0.8 10*3/uL (ref 0.7–4.0)
MCH: 33.3 pg (ref 26.0–34.0)
MCHC: 34.8 g/dL (ref 30.0–36.0)
MCV: 95.6 fL (ref 80.0–100.0)
Monocytes Absolute: 0.6 10*3/uL (ref 0.1–1.0)
Monocytes Relative: 6 %
Neutro Abs: 8.3 10*3/uL — ABNORMAL HIGH (ref 1.7–7.7)
Neutrophils Relative %: 85 %
Platelets: 388 10*3/uL (ref 150–400)
RBC: 4.12 MIL/uL (ref 3.87–5.11)
RDW: 13.6 % (ref 11.5–15.5)
WBC: 9.8 10*3/uL (ref 4.0–10.5)
nRBC: 0 % (ref 0.0–0.2)

## 2021-06-10 LAB — CORTISOL: Cortisol, Plasma: 6.3 ug/dL

## 2021-06-10 LAB — RESP PANEL BY RT-PCR (FLU A&B, COVID) ARPGX2
Influenza A by PCR: NEGATIVE
Influenza B by PCR: NEGATIVE
SARS Coronavirus 2 by RT PCR: NEGATIVE

## 2021-06-10 LAB — HIV ANTIBODY (ROUTINE TESTING W REFLEX): HIV Screen 4th Generation wRfx: NONREACTIVE

## 2021-06-10 LAB — PREALBUMIN: Prealbumin: 10.1 mg/dL — ABNORMAL LOW (ref 18–38)

## 2021-06-10 LAB — MAGNESIUM: Magnesium: 1.9 mg/dL (ref 1.7–2.4)

## 2021-06-10 LAB — PROCALCITONIN: Procalcitonin: 1.54 ng/mL

## 2021-06-10 LAB — T4, FREE: Free T4: 1.28 ng/dL — ABNORMAL HIGH (ref 0.61–1.12)

## 2021-06-10 LAB — TSH: TSH: 2.148 u[IU]/mL (ref 0.350–4.500)

## 2021-06-10 MED ORDER — LINACLOTIDE 145 MCG PO CAPS
290.0000 ug | ORAL_CAPSULE | Freq: Every day | ORAL | Status: DC
  Filled 2021-06-10 (×2): qty 2

## 2021-06-10 MED ORDER — ENOXAPARIN SODIUM 40 MG/0.4ML IJ SOSY
40.0000 mg | PREFILLED_SYRINGE | INTRAMUSCULAR | Status: DC
  Administered 2021-06-10 – 2021-06-11 (×2): 40 mg via SUBCUTANEOUS
  Filled 2021-06-10 (×2): qty 0.4

## 2021-06-10 MED ORDER — ACETAMINOPHEN 325 MG PO TABS: 650.0000 mg | ORAL_TABLET | Freq: Four times a day (QID) | ORAL | Status: DC | PRN

## 2021-06-10 MED ORDER — MORPHINE SULFATE ER 30 MG PO TBCR
60.0000 mg | EXTENDED_RELEASE_TABLET | Freq: Two times a day (BID) | ORAL | Status: DC
  Administered 2021-06-10 – 2021-06-11 (×3): 60 mg via ORAL
  Filled 2021-06-10 (×3): qty 2

## 2021-06-10 MED ORDER — METOCLOPRAMIDE HCL 5 MG/ML IJ SOLN: 5.0000 mg | Freq: Four times a day (QID) | INTRAMUSCULAR | Status: DC | PRN

## 2021-06-10 MED ORDER — HYDROXYZINE HCL 25 MG PO TABS: 25.0000 mg | ORAL_TABLET | Freq: Three times a day (TID) | ORAL | Status: DC | PRN

## 2021-06-10 MED ORDER — COSYNTROPIN 0.25 MG IJ SOLR
0.2500 mg | Freq: Once | INTRAMUSCULAR | Status: AC
  Administered 2021-06-11: 09:00:00 0.25 mg via INTRAVENOUS
  Filled 2021-06-10: qty 0.25

## 2021-06-10 MED ORDER — PANTOPRAZOLE SODIUM 40 MG IV SOLR
40.0000 mg | Freq: Two times a day (BID) | INTRAVENOUS | Status: DC
  Administered 2021-06-10: 09:00:00 40 mg via INTRAVENOUS
  Filled 2021-06-10: qty 40

## 2021-06-10 MED ORDER — DULOXETINE HCL 60 MG PO CPEP
90.0000 mg | ORAL_CAPSULE | Freq: Every day | ORAL | Status: DC
  Administered 2021-06-10 – 2021-06-11 (×2): 90 mg via ORAL
  Filled 2021-06-10 (×2): qty 1

## 2021-06-10 MED ORDER — ATENOLOL 25 MG PO TABS
25.0000 mg | ORAL_TABLET | Freq: Every day | ORAL | Status: DC
  Administered 2021-06-10 – 2021-06-11 (×2): 25 mg via ORAL
  Filled 2021-06-10 (×2): qty 1

## 2021-06-10 MED ORDER — NICOTINE 7 MG/24HR TD PT24
7.0000 mg | MEDICATED_PATCH | Freq: Every day | TRANSDERMAL | Status: DC
  Administered 2021-06-10 – 2021-06-11 (×2): 7 mg via TRANSDERMAL
  Filled 2021-06-10 (×2): qty 1

## 2021-06-10 MED ORDER — ACETAMINOPHEN 650 MG RE SUPP: 650.0000 mg | Freq: Four times a day (QID) | RECTAL | Status: DC | PRN

## 2021-06-10 MED ORDER — PRAMIPEXOLE DIHYDROCHLORIDE 0.25 MG PO TABS: 0.1250 mg | ORAL_TABLET | Freq: Every evening | ORAL | Status: DC | PRN

## 2021-06-10 MED ORDER — ONDANSETRON HCL 4 MG PO TABS: 4.0000 mg | ORAL_TABLET | Freq: Four times a day (QID) | ORAL | Status: DC | PRN

## 2021-06-10 MED ORDER — PANTOPRAZOLE SODIUM 40 MG PO TBEC
40.0000 mg | DELAYED_RELEASE_TABLET | Freq: Every day | ORAL | Status: DC
  Administered 2021-06-10 – 2021-06-11 (×2): 40 mg via ORAL
  Filled 2021-06-10 (×2): qty 1

## 2021-06-10 MED ORDER — METOCLOPRAMIDE HCL 5 MG/ML IJ SOLN
5.0000 mg | Freq: Four times a day (QID) | INTRAMUSCULAR | Status: DC
Start: 2021-06-10 — End: 2021-06-10
  Administered 2021-06-10 (×2): 5 mg via INTRAVENOUS
  Filled 2021-06-10 (×2): qty 2

## 2021-06-10 MED ORDER — SODIUM CHLORIDE 0.9 % IV SOLN
1.0000 g | INTRAVENOUS | Status: DC
  Administered 2021-06-10: 23:00:00 1 g via INTRAVENOUS
  Filled 2021-06-10: qty 10

## 2021-06-10 MED ORDER — ONDANSETRON HCL 4 MG/2ML IJ SOLN: 4.0000 mg | Freq: Four times a day (QID) | INTRAMUSCULAR | Status: DC | PRN

## 2021-06-10 NOTE — Assessment & Plan Note (Addendum)
In the setting of poor p.o. intake ongoing for last few weeks.  Currently oral intake improving. Likely secondary to UTI.  Monitor.

## 2021-06-10 NOTE — Subjective & Objective (Signed)
CC: anorexia, abnormal labs HPI: 55 year old female with a history of chronic pain syndrome on chronic narcotics, interstitial cystitis with a bladder stimulator, chronic tobacco abuse, presents to the ER today with abnormal labs.  Sent from the primary care office.  Patient states that she started having nausea, vomiting, fever, diarrhea for 3 days starting some around May 27, 2021.  This only lasted about 3 to 4 days.  She was left with persistent nausea.  She states that she has not eaten in about 2 weeks.  Is only been able to tolerate water and Pedialyte.  Patient went to see her PCP.  She had labs drawn.  She had a mildly elevated lipase of 133.  She has not had any diarrhea.  She is no longer has any fevers.  She does not have any flank tenderness.  Work-up in the ER showed  Sodium 128 potassium 3.2 chloride of 90 BUN of 19 creatinine 0.8.  UA is negative for nitrites and leukocyte esterase.  COVID-negative influenza negative.  Lipase was only mildly elevated at 97.  White count of 11, he 113.6 platelets of 352.  CT scan shows an asymmetric left nephrogram without evidence of obstruction.  She has gas in her bladder likely from twice daily catheterizations.  Patient had some intra and extrahepatic duct dilatation.  Gallbladder was normal.  Due to the patient's hyponatremia and possible pyelonephritis, patient transferred to [facility].

## 2021-06-10 NOTE — Assessment & Plan Note (Signed)
Chronic.  Patient has a bladder stimulator.  It is MRI compatible.  She had an MRI done last month.

## 2021-06-10 NOTE — Progress Notes (Signed)
Transition of Care (TOC) Screening Note  Patient Details  Name: Arlena Marsan Hagenow Date of Birth: 1965-12-09  Transition of Care [facility]) CM/SW Contact:    Sherie Don, LCSW Phone Number: 06/10/2021, 9:40 AM  Transition of Care Department [facility]) has reviewed patient and no TOC needs have been identified at this time. We will continue to monitor patient advancement through interdisciplinary progression rounds. If new patient transition needs arise, please place a TOC consult.

## 2021-06-10 NOTE — Progress Notes (Signed)
°  Progress Note   Patient: [name] LFY:[id number] DOB: Aug 10, 1965 DOA: 06/09/2021     0 DOS: the patient was seen and examined on 06/10/2021   Brief hospital course: No notes on file  Assessment and Plan * Hyponatremia- (present on admission) Observation medical bed.  Hyponatremia likely due to poor volume intake.  Continue IV fluids with normal saline.  Check TSH and free T4.  We will also check a cortisol level given her week and a half history of intermittent abdominal pain, nausea, vomiting.  Acute lower UTI Continue IV antibiotics. No cultures performed.  Monitor.  Adult failure to thrive- (present on admission) In the setting of poor p.o. intake ongoing for last few weeks.  Currently improving.  Likely secondary to UTI.  Monitor.  Anorexia Patient has had nearly 2 weeks of anorexia.  Started out with nausea, fever, vomiting, diarrhea.  This stopped after 3 days.  Patient's been unable to eat for the last 2 weeks.  Is only been able to drink water.  Has been able to take some Pedialyte.  She does not have pancreatitis.  She does not have pyelonephritis.  UA is negative.  Her lipase of only being 133 could be from her vomiting.  She does have some stranding of her left kidney but patient gets bladder installations with lidocaine and sodium bicarbonate twice a day due to her interstitial cystitis.  She certainly could have some reflux upper ureter from this bladder mixture.  Patient has no burning with urination.  She has no costovertebral angle tenderness.  She has not had any fevers.   Switch to as needed Reglan.  Will require GI follow-up outpatient.  Currently on oral intake improving.  Compulsive tobacco user syndrome- (present on admission) Chronic tobacco use.  Chronic, continuous use of opioids- (present on admission) On MS Contin twice a day.  Status post implantation of urinary electronic stimulator device Chronic.  Interstitial cystitis (chronic) with hematuria-  (present on admission) Chronic.  Patient has a bladder stimulator.  It is MRI compatible.  She had an MRI done last month.  Chronic pain syndrome- (present on admission) Chronic pain syndrome.  Patient states that she is on MS Contin 60 mg twice a day.  TOBACCO ABUSE- (present on admission) Chronic tobacco abuse.  Nicotine patch.     Subjective: Oral intake improving.  No nausea no vomiting.  No abdominal pain right now as well.  No BM so far.  Objective Vital signs were reviewed and unremarkable. General: Appear in mild distress, no Rash; Oral Mucosa Clear, moist. no Abnormal Neck Mass Or lumps, Conjunctiva normal  Cardiovascular: S1 and S2 Present, no Murmur, Respiratory: good respiratory effort, Bilateral Air entry present and CTA, no Crackles, no wheezes Abdomen: Bowel Sound present, Soft and no tenderness Extremities: no Pedal edema Neurology: alert and oriented to time, place, and person affect appropriate. no new focal deficit Gait not checked due to patient safety concerns    Data Reviewed: Improving creatinine.  Family Communication: Family at bedside.  Disposition: Status is: Inpatient  Remains inpatient appropriate because: Ongoing poor p.o. intake, IV fluid requirement.         Time spent: 35 minutes  Author: Berle Mull 06/10/2021 8:44 PM  For on call review [email or web address].

## 2021-06-10 NOTE — Assessment & Plan Note (Signed)
Chronic. 

## 2021-06-10 NOTE — Assessment & Plan Note (Signed)
On MS Contin twice a day.

## 2021-06-10 NOTE — H&P (Signed)
History and Physical    FAATIMAH SPIELBERG ZJI:[id number] DOB: Jul 15, 1965 DOA: 06/09/2021  PCP: Donella Stade, PA-C   Patient coming from: Home  I have personally briefly reviewed patient's old medical records in [place]  CC: anorexia, abnormal labs HPI: 55 year old female with a history of chronic pain syndrome on chronic narcotics, interstitial cystitis with a bladder stimulator, chronic tobacco abuse, presents to the ER today with abnormal labs.  Sent from the primary care office.  Patient states that she started having nausea, vomiting, fever, diarrhea for 3 days starting some around May 27, 2021.  This only lasted about 3 to 4 days.  She was left with persistent nausea.  She states that she has not eaten in about 2 weeks.  Is only been able to tolerate water and Pedialyte.  Patient went to see her PCP.  She had labs drawn.  She had a mildly elevated lipase of 133.  She has not had any diarrhea.  She is no longer has any fevers.  She does not have any flank tenderness.  Work-up in the ER showed  Sodium 128 potassium 3.2 chloride of 90 BUN of 19 creatinine 0.8.  UA is negative for nitrites and leukocyte esterase.  COVID-negative influenza negative.  Lipase was only mildly elevated at 97.  White count of 11, he 113.6 platelets of 352.  CT scan shows an asymmetric left nephrogram without evidence of obstruction.  She has gas in her bladder likely from twice daily catheterizations.  Patient had some intra and extrahepatic duct dilatation.  Gallbladder was normal.  Due to the patient's hyponatremia and possible pyelonephritis, patient transferred to [facility].   ED Course: Na 128, UA negative. Covid negative. Lipase 97. Ct showed ??pyelonephritis. Pt without dysuria, fevers or back pain  Review of Systems:  Review of Systems  Constitutional:  Positive for malaise/fatigue and weight loss.       Had fever, N/V for 3 days around 05-27-2021. This resolved after 3  days.  HENT: Negative.    Eyes: Negative.   Respiratory:  Positive for cough.   Cardiovascular: Negative.   Gastrointestinal:        Had N/V/D for 3 days. Now resolved. Continued anorexia  Genitourinary:  Positive for frequency. Negative for dysuria.       Needs Bid bladder instillation of lidocaine/sodium bicarb for her interstitial cystitis. Pt does not have neurogenic bladder.  Musculoskeletal:  Positive for back pain.       Chronic back pain. Takes bid ms-contin  Skin: Negative.   Neurological: Negative.   Endo/Heme/Allergies: Negative.   Psychiatric/Behavioral: Negative.    All other systems reviewed and are negative.  Past Medical History:  Diagnosis Date   Anxiety    Cancer ([place])    cervical   Colitis    lymphacitic   Depression    Dysrhythmia    pvc    Gastric ulcer    Headache(784.0)    Heart murmur    IBS (irritable bowel syndrome)    Interstitial cystitis    MVP (mitral valve prolapse)    Urinary calculus or stone     Past Surgical History:  Procedure Laterality Date   APPENDECTOMY  2005   BILATERAL OOPHORECTOMY  2012   DILATION AND CURETTAGE OF UTERUS     x 4  one after each SAB   EXPLORATORY LAPAROTOMY  2007   LAPAROSCOPY N/A 08/04/2012   Procedure: LAPAROSCOPY DIAGNOSTIC;  Surgeon: Gayland Curry, MD,FACS;  Location: [facility];  Service: General;  Laterality: N/A;   PARTIAL HYSTERECTOMY  2005   TONSILLECTOMY  30 years ago   TUBAL LIGATION  2000     reports that she has been smoking cigarettes. She has a 25.00 pack-year smoking history. She has never used smokeless tobacco. She reports current alcohol use. She reports that she does not use drugs.  Allergies  Allergen Reactions   Gabapentin Anaphylaxis    Sleepiness Sleepiness    Lyrica [Pregabalin] Anaphylaxis    Made symptoms worse.  Made symptoms worse.    Ambien [Zolpidem] Other (See Comments)    Insomnia Pt did not state her reaction    Amitriptyline Other (See Comments)    Pt did not  state her reaction   Bactrim [Sulfamethoxazole-Trimethoprim] Nausea Only    GI upset    Family History  Problem Relation Age of Onset   Heart attack Mother    Cirrhosis Mother    Cancer Father        tongue   Cirrhosis Father    Cancer Paternal Uncle    Colon cancer Neg Hx    Esophageal cancer Neg Hx    Rectal cancer Neg Hx    Stomach cancer Neg Hx    Breast cancer Neg Hx     Prior to Admission medications   Medication Sig Start Date End Date Taking? Authorizing Provider  albuterol (PROVENTIL) (2.5 MG/3ML) 0.083% nebulizer solution Take 3 mLs (2.5 mg total) by nebulization every 4 (four) hours as needed for wheezing or shortness of breath (please include nebulizer machine, hoses, and mask if needed.). 06/09/21   Breeback, Jade L, PA-C  atenolol (TENORMIN) 50 MG tablet TAKE ONE-HALF TABLET BY MOUTH ONCE DAILY 05/24/16   Lelon Perla, MD  doxycycline (VIBRA-TABS) 100 MG tablet Take 1 tablet (100 mg total) by mouth 2 (two) times daily. 06/09/21   Breeback, Royetta Car, PA-C  DULoxetine (CYMBALTA) 30 MG capsule Take 3 capsules (90 mg total) by mouth daily. 06/01/21   Breeback, Jade L, PA-C  hydrOXYzine (ATARAX/VISTARIL) 25 MG tablet Take 25 mg by mouth 3 (three) times daily as needed.    [provider]  lidocaine (XYLOCAINE) 2 % injection  11/06/20   [provider]  linaclotide Rolan Lipa) 290 MCG CAPS capsule Take 1 capsule (290 mcg total) by mouth daily before breakfast. 03/17/21   Breeback, Jade L, PA-C  morphine (MS CONTIN) 60 MG 12 hr tablet Take 60 mg by mouth.    [provider]  nitrofurantoin (MACRODANTIN) 50 MG capsule Take 50 mg by mouth daily. 03/07/21   [provider]  pantoprazole (PROTONIX) 40 MG tablet Take 1 tablet (40 mg total) by mouth daily. 08/29/20   Breeback, Jade L, PA-C  pramipexole (MIRAPEX) 0.125 MG tablet TAKE 1 TABLET AT BEDTIME AS NEEDED FOR RESTLESS LEGS. 06/01/21   Breeback, Jade L, PA-C  predniSONE (DELTASONE) 50 MG tablet  Take one tablet daily for 5 days. 06/09/21   Breeback, Royetta Car, PA-C  promethazine (PHENERGAN) 25 MG tablet Take one tablet daily as needed for nausea. 03/02/21   Donella Stade, PA-C    Physical Exam: Vitals:   06/09/21 2224 06/09/21 2330 06/10/21 0030 06/10/21 0130  BP: 106/71 101/68 106/64 (!) 138/92  Pulse: 71 65 63 60  Resp: 14 18 17 18   Temp:    97.8 F (36.6 C)  TempSrc:    Oral  SpO2: 95% 95% 95% 99%  Weight:      Height:  Physical Exam Vitals and nursing note reviewed.  Constitutional:      General: She is not in acute distress.    Appearance: Normal appearance. She is not toxic-appearing or diaphoretic.     Comments: Appears chronically ill  HENT:     Head: Normocephalic and atraumatic.     Nose: Nose normal. No rhinorrhea.  Eyes:     General:        Right eye: No discharge.        Left eye: No discharge.  Cardiovascular:     Rate and Rhythm: Normal rate and regular rhythm.     Pulses: Normal pulses.  Pulmonary:     Effort: Pulmonary effort is normal. No respiratory distress.     Breath sounds: Normal breath sounds. No wheezing or rales.  Abdominal:     General: Bowel sounds are normal.     Palpations: Abdomen is soft. There is no mass.     Tenderness: There is no abdominal tenderness. There is no rebound.  Musculoskeletal:     Right lower leg: No edema.     Left lower leg: No edema.  Skin:    General: Skin is warm and dry.     Capillary Refill: Capillary refill takes less than 2 seconds.  Neurological:     General: No focal deficit present.     Mental Status: She is alert and oriented to person, place, and time.     Labs on Admission: I have personally reviewed following labs and imaging studies  CBC: Recent Labs  Lab 06/09/21 1315 06/09/21 2106  WBC 12.3* 11.0*  NEUTROABS 10,394* 10.1*  HGB 15.0 13.6  HCT 43.8 38.3  MCV 95.8 93.4  PLT 396 916   Basic Metabolic Panel: Recent Labs  Lab 06/09/21 1315 06/09/21 2106  NA 134* 128*  K  3.5 3.2*  CL 91* 90*  CO2 28 24  GLUCOSE 82 133*  BUN 19 19  CREATININE 0.78 0.86  CALCIUM 9.4 8.9   GFR: Estimated Creatinine Clearance: 74.5 mL/min (by C-G formula based on SCr of 0.86 mg/dL). Liver Function Tests: Recent Labs  Lab 06/09/21 1315 06/09/21 2106  AST 30 30  ALT 37* 38  ALKPHOS  --  191*  BILITOT 0.7 0.8  PROT 6.5 6.9  ALBUMIN  --  2.7*   Recent Labs  Lab 06/09/21 1315 06/09/21 2106  LIPASE 133* 97*   No results for input(s): AMMONIA in the last 168 hours. Coagulation Profile: No results for input(s): INR, PROTIME in the last 168 hours. Cardiac Enzymes: No results for input(s): CKTOTAL, CKMB, CKMBINDEX, TROPONINI in the last 168 hours. BNP (last 3 results) No results for input(s): PROBNP in the last 8760 hours. HbA1C: No results for input(s): HGBA1C in the last 72 hours. CBG: No results for input(s): GLUCAP in the last 168 hours. Lipid Profile: No results for input(s): CHOL, HDL, LDLCALC, TRIG, CHOLHDL, LDLDIRECT in the last 72 hours. Thyroid Function Tests: No results for input(s): TSH, T4TOTAL, FREET4, T3FREE, THYROIDAB in the last 72 hours. Anemia Panel: No results for input(s): VITAMINB12, FOLATE, FERRITIN, TIBC, IRON, RETICCTPCT in the last 72 hours. Urine analysis:    Component Value Date/Time   COLORURINE AMBER (A) 06/09/2021 2320   APPEARANCEUR CLEAR 06/09/2021 2320   LABSPEC 1.010 06/09/2021 2320   PHURINE 6.0 06/09/2021 2320   GLUCOSEU NEGATIVE 06/09/2021 2320   HGBUR SMALL (A) 06/09/2021 2320   HGBUR 2+ 02/04/2011 1027   BILIRUBINUR SMALL (A) 06/09/2021 2320   BILIRUBINUR  small 12/12/2017 1409   KETONESUR 40 (A) 06/09/2021 2320   PROTEINUR 30 (A) 06/09/2021 2320   UROBILINOGEN 1.0 12/12/2017 1409   UROBILINOGEN 0.2 10/04/2012 1524   NITRITE NEGATIVE 06/09/2021 2320   LEUKOCYTESUR NEGATIVE 06/09/2021 2320    Radiological Exams on Admission: I have personally reviewed images CT ABDOMEN PELVIS W CONTRAST  Result Date:  06/09/2021 CLINICAL DATA:  Suspected pancreatitis. Patient is not eaten in 12 days. Nausea, vomiting, shortness of breath, and weakness. EXAM: CT ABDOMEN AND PELVIS WITH CONTRAST TECHNIQUE: Multidetector CT imaging of the abdomen and pelvis was performed using the standard protocol following bolus administration of intravenous contrast. CONTRAST:  148mL OMNIPAQUE IOHEXOL 300 MG/ML  SOLN COMPARISON:  04/15/2012 FINDINGS: Lower chest: Mild dependent changes in the lung bases. Hepatobiliary: Mild diffuse fatty infiltration of the liver. No focal liver lesions. Gallbladder is normal. Mild intrahepatic and extrahepatic bile duct dilatation. Cause is not identified. Pancreas: Unremarkable. No pancreatic ductal dilatation or surrounding inflammatory changes. Spleen: Normal in size without focal abnormality. Adrenals/Urinary Tract: No adrenal gland nodules. 5 mm stone in the lower pole of the left kidney. No hydronephrosis. Benign-appearing cyst in the left kidney, unchanged. The left renal nephrogram is heterogeneous with delayed appearance of contrast material. No ureteral stones are demonstrated. Changes likely represent pyelonephritis. The bladder is decompressed with gas in the bladder. Bladder gas could arise from instrumentation or cystitis. Stomach/Bowel: Stomach, small bowel, and colon are not abnormally distended. Scattered stool throughout the colon. Appendix is not identified. Vascular/Lymphatic: No significant vascular findings are present. No enlarged abdominal or pelvic lymph nodes. Reproductive: Status post hysterectomy. No adnexal masses. Other: No free air or free fluid in the abdomen. Abdominal wall musculature appears intact. Musculoskeletal: No acute or significant osseous findings. Spinal stimulator lead tips in the sciatic region. Generator pack in the soft tissues over the left gluteal region. IMPRESSION: 1. Asymmetric appearance of the left renal nephrogram without evidence of obstruction, likely  pyelonephritis. 2. Gas in the bladder may result from cystitis or instrumentation. 3. Intra and extrahepatic bile duct dilatation without cause identified. Consider further evaluation with MRCP. Fatty infiltration of the liver. No gallstones identified. 4. No evidence of acute pancreatitis. Electronically Signed   By: Lucienne Capers M.D.   On: 06/09/2021 22:13    EKG: I have personally reviewed EKG: no EKG  Assessment/Plan Principal Problem:   Hyponatremia Active Problems:   Anorexia   TOBACCO ABUSE   Chronic pain syndrome   Interstitial cystitis (chronic) with hematuria   Status post implantation of urinary electronic stimulator device   Chronic, continuous use of opioids   Compulsive tobacco user syndrome    Hyponatremia Observation medical bed.  Hyponatremia likely due to poor volume intake.  Continue IV fluids with normal saline.  Check TSH and free T4.  We will also check a cortisol level given her week and a half history of intermittent abdominal pain, nausea, vomiting.  Anorexia Patient has had nearly 2 weeks of anorexia.  Started out with nausea, fever, vomiting, diarrhea.  This stopped after 3 days.  Patient's been unable to eat for the last 2 weeks.  Is only been able to drink water.  Has been able to take some Pedialyte.  She does not have pancreatitis.  She does not have pyelonephritis.  UA is negative.  Her lipase of only being 133 could be from her vomiting.  She does have some stranding of her left kidney but patient gets bladder installations with lidocaine and sodium bicarbonate  twice a day due to her interstitial cystitis.  She certainly could have some reflux upper ureter from this bladder mixture.  Patient has no burning with urination.  She has no costovertebral angle tenderness.  She has not had any fevers.  We will check a procalcitonin.  I doubt we will need to continue antibiotics.  Patient states that she is nauseous every time she eats.  We will start Reglan 5 mg  every 6 hours.  Patient may benefit from GI consult.  She is never seen a gastroenterologist before.  TOBACCO ABUSE Chronic tobacco abuse.  Nicotine patch.  Chronic pain syndrome Chronic pain syndrome.  Patient states that she is on MS Contin 60 mg twice a day.  Interstitial cystitis (chronic) with hematuria Chronic.  Patient has a bladder stimulator.  It is MRI compatible.  She had an MRI done last month.  Status post implantation of urinary electronic stimulator device Chronic.  Chronic, continuous use of opioids On MS Contin twice a day.  Compulsive tobacco user syndrome Chronic tobacco use.  DVT prophylaxis: Lovenox Code Status: Full Code Family Communication: no family at bedside  Disposition Plan: return home  Consults called: none  Admission status: Observation, Med-Surg   Kristopher Oppenheim, DO [facility] 06/10/2021, 4:43 AM

## 2021-06-10 NOTE — Assessment & Plan Note (Signed)
Chronic tobacco use.

## 2021-06-10 NOTE — Plan of Care (Signed)
  Problem: Health Behavior/Discharge Planning: Goal: Ability to manage health-related needs will improve Outcome: Progressing   Problem: Clinical Measurements: Goal: Ability to maintain clinical measurements within normal limits will improve Outcome: Progressing   Problem: Activity: Goal: Risk for activity intolerance will decrease Outcome: Progressing   Problem: Nutrition: Goal: Adequate nutrition will be maintained Outcome: Progressing   Problem: Elimination: Goal: Will not experience complications related to bowel motility Outcome: Progressing   Problem: Pain Managment: Goal: General experience of comfort will improve Outcome: Progressing   Problem: Safety: Goal: Ability to remain free from injury will improve Outcome: Progressing   

## 2021-06-10 NOTE — Assessment & Plan Note (Signed)
Chronic pain syndrome.  Patient states that she is on MS Contin 60 mg twice a day.

## 2021-06-10 NOTE — Progress Notes (Signed)
Home medications sealed and sent to pharmacy. The patient made aware that husband may pick them up and take them home in the morning. Pt very irritable at this time but cooperative.

## 2021-06-10 NOTE — Assessment & Plan Note (Addendum)
Hyponatremia likely due to poor volume intake.  Treated with IV fluids with normal saline.   Normal TSH and free T4.   Low cortisol level but cosyntropin stim test normal.

## 2021-06-10 NOTE — ED Notes (Signed)
Pt updated to bed assignment status and impending transfer to [facility].  No questions or concerns, will continue to monitor.

## 2021-06-10 NOTE — Assessment & Plan Note (Addendum)
Patient has had nearly 2 weeks of anorexia.  Started out with nausea, fever, vomiting, diarrhea.  This stopped after 3 days.  Patient's been unable to eat for the last 2 weeks.  Is only been able to drink water.  Has been able to take some Pedialyte.  She does not have pancreatitis.  She does not have pyelonephritis.  UA is negative.  Her lipase of only being 133 could be from her vomiting.  She does have some stranding of her left kidney but patient gets bladder installations with lidocaine and sodium bicarbonate twice a day due to her interstitial cystitis.  She certainly could have some reflux upper ureter from this bladder mixture.  Patient has no burning with urination.  She has no costovertebral angle tenderness.  She has not had any fevers.   Will require GI follow-up outpatient.  Currently oral intake improving.

## 2021-06-10 NOTE — Assessment & Plan Note (Addendum)
Continue antibiotics. No cultures performed.

## 2021-06-10 NOTE — Assessment & Plan Note (Signed)
Chronic tobacco abuse.  Nicotine patch.

## 2021-06-11 LAB — COMPREHENSIVE METABOLIC PANEL
ALT: 27 U/L (ref 0–44)
AST: 25 U/L (ref 15–41)
Albumin: 2.5 g/dL — ABNORMAL LOW (ref 3.5–5.0)
Alkaline Phosphatase: 148 U/L — ABNORMAL HIGH (ref 38–126)
Anion gap: 9 (ref 5–15)
BUN: 18 mg/dL (ref 6–20)
CO2: 29 mmol/L (ref 22–32)
Calcium: 8.9 mg/dL (ref 8.9–10.3)
Chloride: 96 mmol/L — ABNORMAL LOW (ref 98–111)
Creatinine, Ser: 0.89 mg/dL (ref 0.44–1.00)
GFR, Estimated: >60 mL/min (ref >60)
Glucose, Bld: 105 mg/dL — ABNORMAL HIGH (ref 70–99)
Potassium: 3.7 mmol/L (ref 3.5–5.1)
Sodium: 134 mmol/L — ABNORMAL LOW (ref 135–145)
Total Bilirubin: 0.8 mg/dL (ref 0.3–1.2)
Total Protein: 6.4 g/dL — ABNORMAL LOW (ref 6.5–8.1)

## 2021-06-11 LAB — CBC WITH DIFFERENTIAL/PLATELET
Abs Immature Granulocytes: 0.04 10*3/uL (ref 0.00–0.07)
Basophils Absolute: 0 10*3/uL (ref 0.0–0.1)
Basophils Relative: 0 %
Eosinophils Absolute: 0.1 10*3/uL (ref 0.0–0.5)
Eosinophils Relative: 1 %
HCT: 37.3 % (ref 36.0–46.0)
Hemoglobin: 13 g/dL (ref 12.0–15.0)
Immature Granulocytes: 0 %
Lymphocytes Relative: 9 %
Lymphs Abs: 1.1 10*3/uL (ref 0.7–4.0)
MCH: 33.2 pg (ref 26.0–34.0)
MCHC: 34.9 g/dL (ref 30.0–36.0)
MCV: 95.2 fL (ref 80.0–100.0)
Monocytes Absolute: 0.9 10*3/uL (ref 0.1–1.0)
Monocytes Relative: 8 %
Neutro Abs: 9.9 10*3/uL — ABNORMAL HIGH (ref 1.7–7.7)
Neutrophils Relative %: 82 %
Platelets: 341 10*3/uL (ref 150–400)
RBC: 3.92 MIL/uL (ref 3.87–5.11)
RDW: 13.5 % (ref 11.5–15.5)
WBC: 12.1 10*3/uL — ABNORMAL HIGH (ref 4.0–10.5)
nRBC: 0 % (ref 0.0–0.2)

## 2021-06-11 LAB — MAGNESIUM: Magnesium: 1.5 mg/dL — ABNORMAL LOW (ref 1.7–2.4)

## 2021-06-11 LAB — ACTH STIMULATION, 3 TIME POINTS
Cortisol, 30 Min: 24.7 ug/dL
Cortisol, 60 Min: 30.2 ug/dL
Cortisol, Base: 16.6 ug/dL

## 2021-06-11 MED ORDER — CEFDINIR 300 MG PO CAPS: 300.0000 mg | ORAL_CAPSULE | Freq: Two times a day (BID) | ORAL | 0 refills | Status: AC

## 2021-06-11 MED ORDER — NICOTINE 7 MG/24HR TD PT24: 7.0000 mg | MEDICATED_PATCH | Freq: Every day | TRANSDERMAL | 0 refills | Status: DC

## 2021-06-11 MED ORDER — MAGNESIUM SULFATE 2 GM/50ML IV SOLN
2.0000 g | Freq: Once | INTRAVENOUS | Status: AC
  Administered 2021-06-11: 12:00:00 2 g via INTRAVENOUS
  Filled 2021-06-11: qty 50

## 2021-06-11 NOTE — Progress Notes (Signed)
Discharge instructions given to patient and husband d Gabriel Paulding Therapist, sports

## 2021-06-11 NOTE — Progress Notes (Signed)
Initial Nutrition Assessment  INTERVENTION:   -Encouraged PO intake  -Declines supplements at this time (but is eating 75-100%)  NUTRITION DIAGNOSIS:   Inadequate oral intake related to nausea, vomiting, diarrhea as evidenced by per patient/family report.  GOAL:   Patient will meet greater than or equal to 90% of their needs  MONITOR:   PO intake, Labs, Weight trends, I & O's  REASON FOR ASSESSMENT:   Malnutrition Screening Tool    ASSESSMENT:   55 year old female with a history of chronic pain syndrome on chronic narcotics, interstitial cystitis with a bladder stimulator, chronic tobacco abuse, presents to the ER today with abnormal labs.  Patient reports not eating for 2 weeks PTA, Initially had 3 days of N/V and diarrhea, then her appetite disappeared. States she was mainly drinking water. Reports a sensation of feeling like her mouth couldn't open but this has improved now that she is eating . Pt is currently consuming 75-100%, reports improved appetite. Ate an apple, 3 pc of bacon and french toast this morning with no issues. Denies nausea. Declines protein shakes at this time.  Per patient she has lost 16 lbs. UBW ~165 lbs. Per weight records, pt has lost 2 lbs since October 2022.  Medications: Lactated ringers, IV Mg sulfate  Labs reviewed:  Low Na, Mg  NUTRITION - FOCUSED PHYSICAL EXAM:  Flowsheet Row Most Recent Value  Orbital Region No depletion  Upper Arm Region No depletion  Thoracic and Lumbar Region No depletion  Buccal Region No depletion  Temple Region Mild depletion  Clavicle Bone Region No depletion  Clavicle and Acromion Bone Region No depletion  Scapular Bone Region No depletion  Dorsal Hand No depletion  Patellar Region No depletion  Anterior Thigh Region No depletion  Posterior Calf Region No depletion  Edema (RD Assessment) None  Hair Reviewed  Eyes Reviewed  Mouth Reviewed  Skin Reviewed       Diet Order:   Diet Order              Diet regular Room service appropriate? Yes; Fluid consistency: Thin  Diet effective now                   EDUCATION NEEDS:   No education needs have been identified at this time  Skin:  Skin Assessment: Reviewed RN Assessment  Last BM:  12/18  Height:   Ht Readings from Last 1 Encounters:  06/09/21 5\' 6"  (1.676 m)    Weight:   Wt Readings from Last 1 Encounters:  06/09/21 72.6 kg    BMI:  Body mass index is 25.83 kg/m.  Estimated Nutritional Needs:   Kcal:  1800-2000  Protein:  85-95g  Fluid:  1.8L/day   Clayton Bibles, MS, RD, LDN Inpatient Clinical Dietitian Contact information available via Amion

## 2021-06-12 ENCOUNTER — Encounter: Payer: Self-pay | Admitting: Physician Assistant

## 2021-06-14 NOTE — Discharge Summary (Signed)
Physician Discharge Summary   Patient: [name] MRN: [id number] DOB: @DOB   Admit date:     06/09/2021  Discharge date: 06/11/2021  Discharge Physician: Berle Mull   PCP: Donella Stade, PA-C   Recommendations at discharge: Follow up with PCP in 1 week  Discharge Diagnoses Principal Problem:   Acute lower UTI Active Problems:   Hyponatremia   Anorexia   Adult failure to thrive   Chronic pain syndrome   Interstitial cystitis (chronic) with hematuria   Status post implantation of urinary electronic stimulator device   Chronic, continuous use of opioids   Smoker  Resolved Problems:   * No resolved hospital problems. [facility]   * Acute lower UTI- (present on admission) Continue antibiotics. No cultures performed.  Adult failure to thrive- (present on admission) In the setting of poor p.o. intake ongoing for last few weeks.  Currently oral intake improving. Likely secondary to UTI.  Monitor.  Anorexia- (present on admission) Patient has had nearly 2 weeks of anorexia.  Started out with nausea, fever, vomiting, diarrhea.  This stopped after 3 days.  Patient's been unable to eat for the last 2 weeks.  Is only been able to drink water.  Has been able to take some Pedialyte.  She does not have pancreatitis.  She does not have pyelonephritis.  UA is negative.  Her lipase of only being 133 could be from her vomiting.  She does have some stranding of her left kidney but patient gets bladder installations with lidocaine and sodium bicarbonate twice a day due to her interstitial cystitis.  She certainly could have some reflux upper ureter from this bladder mixture.  Patient has no burning with urination.  She has no costovertebral angle tenderness.  She has not had any fevers.   Will require GI follow-up outpatient.  Currently oral intake improving.  Hyponatremia- (present on admission) Hyponatremia likely due to poor volume intake.  Treated with IV fluids with  normal saline.   Normal TSH and free T4.   Low cortisol level but cosyntropin stim test normal.   Chronic, continuous use of opioids- (present on admission) On MS Contin twice a day.  Status post implantation of urinary electronic stimulator device Chronic.  Interstitial cystitis (chronic) with hematuria- (present on admission) Chronic.  Patient has a bladder stimulator.  It is MRI compatible.  She had an MRI done last month.  Chronic pain syndrome- (present on admission) Chronic pain syndrome.  Patient states that she is on MS Contin 60 mg twice a day.  Smoker- (present on admission) Chronic tobacco use.  TOBACCO ABUSE Chronic tobacco abuse.  Nicotine patch.      Consultants: none Procedures performed: none  Disposition: Home Diet recommendation: Regular diet  DISCHARGE MEDICATION: Allergies as of 06/11/2021       Reactions   Gabapentin Anaphylaxis   Sleepiness Sleepiness   Lyrica [pregabalin] Anaphylaxis   Made symptoms worse.  Made symptoms worse.    Ambien [zolpidem] Other (See Comments)   Insomnia Pt did not state her reaction    Amitriptyline Other (See Comments)   Pt did not state her reaction   Bactrim [sulfamethoxazole-trimethoprim] Nausea Only   GI upset        Medication List     STOP taking these medications    doxycycline 100 MG tablet Commonly known as: VIBRA-TABS   predniSONE 50 MG tablet Commonly known as: DELTASONE       TAKE these medications    albuterol (  2.5 MG/3ML) 0.083% nebulizer solution Commonly known as: PROVENTIL Take 3 mLs (2.5 mg total) by nebulization every 4 (four) hours as needed for wheezing or shortness of breath (please include nebulizer machine, hoses, and mask if needed.).   atenolol 50 MG tablet Commonly known as: TENORMIN TAKE ONE-HALF TABLET BY MOUTH ONCE DAILY What changed: how much to take   cefdinir 300 MG capsule Commonly known as: OMNICEF Take 1 capsule (300 mg total) by mouth 2 (two) times  daily for 6 days.   diphenhydramine-acetaminophen 25-500 MG Tabs tablet Commonly known as: TYLENOL PM Take 2 tablets by mouth at bedtime as needed (sleep).   DULoxetine 30 MG capsule Commonly known as: CYMBALTA Take 3 capsules (90 mg total) by mouth daily.   hydrOXYzine 25 MG tablet Commonly known as: ATARAX Take 25 mg by mouth 3 (three) times daily as needed for anxiety.   lidocaine 2 % injection Commonly known as: XYLOCAINE 10 mLs by Other route See admin instructions. Once to twice daily as needed for pain   linaclotide 290 MCG Caps capsule Commonly known as: Linzess Take 1 capsule (290 mcg total) by mouth daily before breakfast. What changed:  when to take this reasons to take this   morphine 60 MG 12 hr tablet Commonly known as: MS CONTIN Take 60 mg by mouth every 12 (twelve) hours.   nicotine 7 mg/24hr patch Commonly known as: NICODERM CQ - dosed in mg/24 hr Place 1 patch (7 mg total) onto the skin daily.   nitrofurantoin 50 MG capsule Commonly known as: MACRODANTIN Take 50 mg by mouth daily.   pantoprazole 40 MG tablet Commonly known as: PROTONIX Take 1 tablet (40 mg total) by mouth daily. What changed:  when to take this reasons to take this   pramipexole 0.125 MG tablet Commonly known as: MIRAPEX TAKE 1 TABLET AT BEDTIME AS NEEDED FOR RESTLESS LEGS. What changed:  how much to take how to take this when to take this additional instructions   promethazine 25 MG tablet Commonly known as: PHENERGAN Take one tablet daily as needed for nausea. What changed:  how much to take how to take this when to take this reasons to take this additional instructions Another medication with the same name was removed. Continue taking this medication, and follow the directions you see here.        Follow-up Information     Donella Stade, PA-C. Schedule an appointment as soon as possible for a visit in 1 week(s).   Specialty: Family Medicine Contact  information: [address] [place] [address] [phone]                 Discharge Exam: Danley Danker Weights   06/09/21 1954  Weight: 72.6 kg   General: Appear in mild distress, no Rash; Oral Mucosa Clear, moist. no Abnormal Neck Mass Or lumps, Conjunctiva normal  Cardiovascular: S1 and S2 Present, no Murmur, Respiratory: good respiratory effort, Bilateral Air entry present and CTA, no Crackles, no wheezes Abdomen: Bowel Sound present, Soft and no tenderness Extremities: no Pedal edema Neurology: alert and oriented to time, place, and person affect appropriate. no new focal deficit Gait not checked due to patient safety concerns    Condition at discharge: good  The results of significant diagnostics from this hospitalization (including imaging, microbiology, ancillary and laboratory) are listed below for reference.   Imaging Studies: CT ABDOMEN PELVIS W CONTRAST  Result Date: 06/09/2021 CLINICAL DATA:  Suspected pancreatitis. Patient  is not eaten in 12 days. Nausea, vomiting, shortness of breath, and weakness. EXAM: CT ABDOMEN AND PELVIS WITH CONTRAST TECHNIQUE: Multidetector CT imaging of the abdomen and pelvis was performed using the standard protocol following bolus administration of intravenous contrast. CONTRAST:  124mL OMNIPAQUE IOHEXOL 300 MG/ML  SOLN COMPARISON:  04/15/2012 FINDINGS: Lower chest: Mild dependent changes in the lung bases. Hepatobiliary: Mild diffuse fatty infiltration of the liver. No focal liver lesions. Gallbladder is normal. Mild intrahepatic and extrahepatic bile duct dilatation. Cause is not identified. Pancreas: Unremarkable. No pancreatic ductal dilatation or surrounding inflammatory changes. Spleen: Normal in size without focal abnormality. Adrenals/Urinary Tract: No adrenal gland nodules. 5 mm stone in the lower pole of the left kidney. No hydronephrosis. Benign-appearing cyst in the left kidney, unchanged. The left renal  nephrogram is heterogeneous with delayed appearance of contrast material. No ureteral stones are demonstrated. Changes likely represent pyelonephritis. The bladder is decompressed with gas in the bladder. Bladder gas could arise from instrumentation or cystitis. Stomach/Bowel: Stomach, small bowel, and colon are not abnormally distended. Scattered stool throughout the colon. Appendix is not identified. Vascular/Lymphatic: No significant vascular findings are present. No enlarged abdominal or pelvic lymph nodes. Reproductive: Status post hysterectomy. No adnexal masses. Other: No free air or free fluid in the abdomen. Abdominal wall musculature appears intact. Musculoskeletal: No acute or significant osseous findings. Spinal stimulator lead tips in the sciatic region. Generator pack in the soft tissues over the left gluteal region. IMPRESSION: 1. Asymmetric appearance of the left renal nephrogram without evidence of obstruction, likely pyelonephritis. 2. Gas in the bladder may result from cystitis or instrumentation. 3. Intra and extrahepatic bile duct dilatation without cause identified. Consider further evaluation with MRCP. Fatty infiltration of the liver. No gallstones identified. 4. No evidence of acute pancreatitis. Electronically Signed   By: Lucienne Capers M.D.   On: 06/09/2021 22:13    Microbiology: Results for orders placed or performed during the hospital encounter of 06/09/21  Resp Panel by RT-PCR (Flu A&B, Covid) Urine, Clean Catch     Status: None   Collection Time: 06/09/21 11:19 PM   Specimen: Urine, Clean Catch; Nasopharyngeal(NP) swabs in vial transport medium  Result Value Ref Range Status   SARS Coronavirus 2 by RT PCR NEGATIVE NEGATIVE Final    Comment: (NOTE) SARS-CoV-2 target nucleic acids are NOT DETECTED.  The SARS-CoV-2 RNA is generally detectable in upper respiratory specimens during the acute phase of infection. The lowest concentration of SARS-CoV-2 viral copies this assay  can detect is 138 copies/mL. A negative result does not preclude SARS-Cov-2 infection and should not be used as the sole basis for treatment or other patient management decisions. A negative result may occur with  improper specimen collection/handling, submission of specimen other than nasopharyngeal swab, presence of viral mutation(s) within the areas targeted by this assay, and inadequate number of viral copies(<138 copies/mL). A negative result must be combined with clinical observations, patient history, and epidemiological information. The expected result is Negative.  Fact Sheet for Patients:  EntrepreneurPulse.com.au  Fact Sheet for Healthcare Providers:  IncredibleEmployment.be  This test is no t yet approved or cleared by the [place] FDA and  has been authorized for detection and/or diagnosis of SARS-CoV-2 by FDA under an Emergency Use Authorization (EUA). This EUA will remain  in effect (meaning this test can be used) for the duration of the COVID-19 declaration under Section 564(b)(1) of the Act, 21 U.S.C.section 360bbb-3(b)(1), unless the authorization is terminated  or revoked sooner.  Influenza A by PCR NEGATIVE NEGATIVE Final   Influenza B by PCR NEGATIVE NEGATIVE Final    Comment: (NOTE) The Xpert Xpress SARS-CoV-2/FLU/RSV plus assay is intended as an aid in the diagnosis of influenza from Nasopharyngeal swab specimens and should not be used as a sole basis for treatment. Nasal washings and aspirates are unacceptable for Xpert Xpress SARS-CoV-2/FLU/RSV testing.  Fact Sheet for Patients: EntrepreneurPulse.com.au  Fact Sheet for Healthcare Providers: IncredibleEmployment.be  This test is not yet approved or cleared by the [place] FDA and has been authorized for detection and/or diagnosis of SARS-CoV-2 by FDA under an Emergency Use Authorization (EUA). This EUA will  remain in effect (meaning this test can be used) for the duration of the COVID-19 declaration under Section 564(b)(1) of the Act, 21 U.S.C. section 360bbb-3(b)(1), unless the authorization is terminated or revoked.  Performed at [facility], [address] [place], [place] [address]     Labs: CBC: Recent Labs  Lab 06/09/21 1315 06/09/21 2106 06/10/21 0607 06/11/21 0850  WBC 12.3* 11.0* 9.8 12.1*  NEUTROABS 10,394* 10.1* 8.3* 9.9*  HGB 15.0 13.6 13.7 13.0  HCT 43.8 38.3 39.4 37.3  MCV 95.8 93.4 95.6 95.2  PLT 396 352 388 267   Basic Metabolic Panel: Recent Labs  Lab 06/09/21 1315 06/09/21 2106 06/10/21 0607 06/11/21 0850  NA 134* 128* 132* 134*  K 3.5 3.2* 3.7 3.7  CL 91* 90* 95* 96*  CO2 28 24 23 29   GLUCOSE 82 133* 108* 105*  BUN 19 19 22* 18  CREATININE 0.78 0.86 0.88 0.89  CALCIUM 9.4 8.9 9.3 8.9  MG  --   --  1.9 1.5*   Liver Function Tests: Recent Labs  Lab 06/09/21 1315 06/09/21 2106 06/10/21 0607 06/11/21 0850  AST 30 30 26 25   ALT 37* 38 37 27  ALKPHOS  --  191* 190* 148*  BILITOT 0.7 0.8 0.8 0.8  PROT 6.5 6.9 7.0 6.4*  ALBUMIN  --  2.7* 2.9* 2.5*   CBG: No results for input(s): GLUCAP in the last 168 hours.  Discharge time spent: greater than 30 minutes.  Signed:  Berle Mull MD.  [facility]

## 2021-06-17 ENCOUNTER — Encounter: Payer: Self-pay | Admitting: Physician Assistant

## 2021-06-17 ENCOUNTER — Other Ambulatory Visit: Payer: Self-pay

## 2021-06-17 ENCOUNTER — Ambulatory Visit (INDEPENDENT_AMBULATORY_CARE_PROVIDER_SITE_OTHER): Payer: Medicare HMO | Admitting: Physician Assistant

## 2021-06-17 VITALS — BP 129/88 | HR 78 | Ht 66.0 in | Wt 156.0 lb

## 2021-06-17 DIAGNOSIS — N390 Urinary tract infection, site not specified: Secondary | ICD-10-CM

## 2021-06-17 DIAGNOSIS — J329 Chronic sinusitis, unspecified: Secondary | ICD-10-CM | POA: Diagnosis not present

## 2021-06-17 DIAGNOSIS — K838 Other specified diseases of biliary tract: Secondary | ICD-10-CM | POA: Diagnosis not present

## 2021-06-17 DIAGNOSIS — E871 Hypo-osmolality and hyponatremia: Secondary | ICD-10-CM

## 2021-06-17 DIAGNOSIS — K760 Fatty (change of) liver, not elsewhere classified: Secondary | ICD-10-CM

## 2021-06-17 DIAGNOSIS — J40 Bronchitis, not specified as acute or chronic: Secondary | ICD-10-CM

## 2021-06-17 DIAGNOSIS — R630 Anorexia: Secondary | ICD-10-CM | POA: Diagnosis not present

## 2021-06-17 DIAGNOSIS — R748 Abnormal levels of other serum enzymes: Secondary | ICD-10-CM | POA: Diagnosis not present

## 2021-06-17 DIAGNOSIS — F330 Major depressive disorder, recurrent, mild: Secondary | ICD-10-CM

## 2021-06-17 MED ORDER — ALBUTEROL SULFATE (2.5 MG/3ML) 0.083% IN NEBU: 2.5000 mg | INHALATION_SOLUTION | RESPIRATORY_TRACT | 0 refills | Status: DC | PRN

## 2021-06-17 NOTE — Telephone Encounter (Signed)
[place] (1st attempt)    [facility] at [phone] regarding reference #[id number] and was told that the pt's insurance termed on 06/13/2021. I called [facility] on [address] to see if they had different insurance info and they did not. They tried to run the Rx again through her insurance and it was denied. LVM for pt to return call so we can get the new insurance info. MyChart message also sent to pt.

## 2021-06-17 NOTE — Progress Notes (Signed)
Subjective:    Patient ID: [name], female    DOB: 1965/11/02, 56 y.o.   MRN: [id number]  HPI Pt is a 56 yo female who presents to the clinic to follow up from hospital admission for Acute lower GI, anorexia, hyponatremia, nausea, vomiting. She has chronic pain due to IC. She was admitted on 12/27 and discharged on 06/12/19. She was discharged with cefdinir. She has 2 days left. She is feeling much better. She is eating and drinking without nausea or vomiting. She denies any fever, chills, body aches, dysuria(more than normal), abdominal or back pain.   She needs refill on albuterol sent to [facility] for as needed usage.  .. Active Ambulatory Problems    Diagnosis Date Noted   TOBACCO ABUSE 05/28/2009   MITRAL VALVE PROLAPSE 05/28/2009   UNSPECIFIED CARDIAC DYSRHYTHMIA 05/16/2009   TMJ PAIN 07/26/2008   GASTRIC ULCER 05/26/2009   NEPHROLITHIASIS 03/06/2010   HEMATURIA UNSPECIFIED 12/19/2008   OVARIAN CYST 03/06/2010   PALPITATIONS 05/16/2009   HEART MURMUR, HX OF 05/26/2009   Depressive disorder, not elsewhere classified 11/06/2010   Lymphocytic colitis 12/01/2011   EBV infection 04/21/2012   Chronic pain syndrome 06/21/2012   Fibromyalgia 06/21/2012   Carpal tunnel syndrome, bilateral 06/21/2012   Degenerative disc disease, cervical 06/21/2012   Facet arthropathy, cervical 06/21/2012   DDD (degenerative disc disease), lumbosacral 06/21/2012   Lower abdominal pain 07/26/2012   Interstitial cystitis (chronic) with hematuria 04/13/2013   Primary osteoarthritis of right knee 12/20/2013   Incontinence in female 06/26/2014   Chronic bilateral lower abdominal pain 06/26/2014   Pain in joint, ankle and foot 04/21/2015   Status post implantation of urinary electronic stimulator device 05/05/2015   Post-menopausal 06/20/2015   Family history of cardiovascular disease 06/20/2015   Drug induced constipation 06/20/2015   No energy 06/20/2015   Dysuria 06/20/2015   Hiatal hernia  07/16/2015   Depression 09/03/2015   Primary osteoarthritis of left hand 02/27/2016   Breast pain, right 09/14/2016   Abnormal weight gain 03/16/2017   BMI 27.0-27.9,adult 03/16/2017   History of gastric ulcer 04/09/2019   RLS (restless legs syndrome) 04/09/2019   Mild episode of recurrent major depressive disorder ([place]) 03/17/2021   Stuttering 03/17/2021   Elevated LDL cholesterol level 03/17/2021   Word finding difficulty 03/17/2021   Sleep talking 03/17/2021   Abnormal MRI of head 05/19/2021   Chronic, continuous use of opioids 09/28/2013   Smoker 05/28/2009   Hyponatremia 06/10/2021   Anorexia 06/10/2021   Adult failure to thrive 06/10/2021   Acute lower UTI 06/10/2021   Elevated lipase 06/17/2021   Fatty liver 06/17/2021   Common bile duct dilatation 06/17/2021   Resolved Ambulatory Problems    Diagnosis Date Noted   Other change of lacrimal passages 03/01/2010   URI 08/17/2008   STONE, URINARY CALCULUS,UNSPEC. 12/29/2008   FOOT PAIN, LEFT 04/14/2010   Diarrhea 11/04/2009   CONTUSION, LEFT HAND 05/10/2009   CHEMICAL BURN 04/14/2010   Nephrolithiasis 95/02/3266   Bacterial folliculitis 12/45/8099   Acute pharyngitis 01/30/2011   BRONCHITIS, ACUTE 02/04/2011   ABDOMINAL PAIN, ACUTE 02/04/2011   Past Medical History:  Diagnosis Date   Anxiety    Cancer ([place])    Colitis    Dysrhythmia    Gastric ulcer    Headache(784.0)    Heart murmur    IBS (irritable bowel syndrome)    Interstitial cystitis    MVP (mitral valve prolapse)    Urinary calculus or stone  Review of Systems    See HPI.  Objective:   Physical Exam Vitals reviewed.  Constitutional:      Appearance: Normal appearance.  HENT:     Head: Normocephalic.     Right Ear: Tympanic membrane normal.     Left Ear: Tympanic membrane normal.     Nose: Nose normal.     Mouth/Throat:     Mouth: Mucous membranes are moist.  Cardiovascular:     Rate and Rhythm: Normal rate and regular rhythm.      Pulses: Normal pulses.     Heart sounds: Normal heart sounds.  Pulmonary:     Effort: Pulmonary effort is normal.     Breath sounds: Normal breath sounds.  Abdominal:     General: There is no distension.     Palpations: Abdomen is soft. There is no mass.     Tenderness: There is no abdominal tenderness. There is no right CVA tenderness, left CVA tenderness, guarding or rebound.     Hernia: No hernia is present.  Musculoskeletal:     Right lower leg: No edema.     Left lower leg: No edema.  Neurological:     General: No focal deficit present.     Mental Status: She is alert.  Psychiatric:        Mood and Affect: Mood normal.          Assessment & Plan:   [name] KitchenMarland KitchenMihaela was seen today for follow-up.  Diagnoses and all orders for this visit:  Acute lower UTI -     CBC w/Diff/Platelet -     COMPLETE METABOLIC PANEL WITH GFR -     Lipase -     Urine Culture  Hyponatremia -     COMPLETE METABOLIC PANEL WITH GFR  Anorexia -     CBC w/Diff/Platelet -     COMPLETE METABOLIC PANEL WITH GFR -     Lipase  Elevated lipase -     Lipase  Sinobronchitis -     albuterol (PROVENTIL) (2.5 MG/3ML) 0.083% nebulizer solution; Take 3 mLs (2.5 mg total) by nebulization every 4 (four) hours as needed for wheezing or shortness of breath (please include nebulizer machine, hoses, and mask if needed.).  Common bile duct dilatation -     Ambulatory referral to Gastroenterology  Fatty liver -     Ambulatory referral to Gastroenterology   Recheck CBC, CMP, lipase Vitals look great  Culture urine to make sure infection has cleared Finish cefdinir Pt is doing much better. She is eating and drinking without nausea or vomiting.  CT scan did show bile duct dilatation without cause. Will refer to GI.  Follow up as needed or if symptoms worsen.

## 2021-06-17 NOTE — Patient Instructions (Signed)
Urinary Tract Infection, Adult A urinary tract infection (UTI) is an infection of any part of the urinary tract. The urinary tract includes the kidneys, ureters, bladder, and urethra. These organs make, store, and get rid of urine in the body. An upper UTI affects the ureters and kidneys. A lower UTI affects the bladder and urethra. What are the causes? Most urinary tract infections are caused by bacteria in your genital area around your urethra, where urine leaves your body. These bacteria grow and cause inflammation of your urinary tract. What increases the risk? You are more likely to develop this condition if: You have a urinary catheter that stays in place. You are not able to control when you urinate or have a bowel movement (incontinence). You are female and you: Use a spermicide or diaphragm for birth control. Have low estrogen levels. Are pregnant. You have certain genes that increase your risk. You are sexually active. You take antibiotic medicines. You have a condition that causes your flow of urine to slow down, such as: An enlarged prostate, if you are female. Blockage in your urethra. A kidney stone. A nerve condition that affects your bladder control (neurogenic bladder). Not getting enough to drink, or not urinating often. You have certain medical conditions, such as: Diabetes. A weak disease-fighting system (immunesystem). Sickle cell disease. Gout. Spinal cord injury. What are the signs or symptoms? Symptoms of this condition include: Needing to urinate right away (urgency). Frequent urination. This may include small amounts of urine each time you urinate. Pain or burning with urination. Blood in the urine. Urine that smells bad or unusual. Trouble urinating. Cloudy urine. Vaginal discharge, if you are female. Pain in the abdomen or the lower back. You may also have: Vomiting or a decreased appetite. Confusion. Irritability or tiredness. A fever or  chills. Diarrhea. The first symptom in older adults may be confusion. In some cases, they may not have any symptoms until the infection has worsened. How is this diagnosed? This condition is diagnosed based on your medical history and a physical exam. You may also have other tests, including: Urine tests. Blood tests. Tests for STIs (sexually transmitted infections). If you have had more than one UTI, a cystoscopy or imaging studies may be done to determine the cause of the infections. How is this treated? Treatment for this condition includes: Antibiotic medicine. Over-the-counter medicines to treat discomfort. Drinking enough water to stay hydrated. If you have frequent infections or have other conditions such as a kidney stone, you may need to see a health care provider who specializes in the urinary tract (urologist). In rare cases, urinary tract infections can cause sepsis. Sepsis is a life-threatening condition that occurs when the body responds to an infection. Sepsis is treated in the hospital with IV antibiotics, fluids, and other medicines. Follow these instructions at home: Medicines Take over-the-counter and prescription medicines only as told by your health care provider. If you were prescribed an antibiotic medicine, take it as told by your health care provider. Do not stop using the antibiotic even if you start to feel better. General instructions Make sure you: Empty your bladder often and completely. Do not hold urine for long periods of time. Empty your bladder after sex. Wipe from front to back after urinating or having a bowel movement if you are female. Use each tissue only one time when you wipe. Drink enough fluid to keep your urine pale yellow. Keep all follow-up visits. This is important. Contact a health care provider   if: Your symptoms do not get better after 1-2 days. Your symptoms go away and then return. Get help right away if: You have severe pain in your  back or your lower abdomen. You have a fever or chills. You have nausea or vomiting. Summary A urinary tract infection (UTI) is an infection of any part of the urinary tract, which includes the kidneys, ureters, bladder, and urethra. Most urinary tract infections are caused by bacteria in your genital area. Treatment for this condition often includes antibiotic medicines. If you were prescribed an antibiotic medicine, take it as told by your health care provider. Do not stop using the antibiotic even if you start to feel better. Keep all follow-up visits. This is important. This information is not intended to replace advice given to you by your health care provider. Make sure you discuss any questions you have with your health care provider. Document Revised: 01/11/2020 Document Reviewed: 01/11/2020 Elsevier Patient Education  2022 Elsevier Inc.  

## 2021-06-18 LAB — COMPLETE METABOLIC PANEL WITH GFR
AG Ratio: 0.9 (calc) — ABNORMAL LOW (ref 1.0–2.5)
ALT: 15 U/L (ref 6–29)
AST: 18 U/L (ref 10–35)
Albumin: 3.1 g/dL — ABNORMAL LOW (ref 3.6–5.1)
Alkaline phosphatase (APISO): 115 U/L (ref 37–153)
BUN: 15 mg/dL (ref 7–25)
CO2: 32 mmol/L (ref 20–32)
Calcium: 8.9 mg/dL (ref 8.6–10.4)
Chloride: 103 mmol/L (ref 98–110)
Creat: 0.74 mg/dL (ref 0.50–1.03)
Globulin: 3.3 g/dL (calc) (ref 1.9–3.7)
Glucose, Bld: 62 mg/dL — ABNORMAL LOW (ref 65–99)
Potassium: 4.1 mmol/L (ref 3.5–5.3)
Sodium: 143 mmol/L (ref 135–146)
Total Bilirubin: 0.5 mg/dL (ref 0.2–1.2)
Total Protein: 6.4 g/dL (ref 6.1–8.1)
eGFR: 95 mL/min/{1.73_m2} (ref >=60)

## 2021-06-18 LAB — CBC WITH DIFFERENTIAL/PLATELET
Absolute Monocytes: 835 cells/uL (ref 200–950)
Basophils Absolute: 43 cells/uL (ref 0–200)
Basophils Relative: 0.6 %
Eosinophils Absolute: 187 cells/uL (ref 15–500)
Eosinophils Relative: 2.6 %
HCT: 35.3 % (ref 35.0–45.0)
Hemoglobin: 11.9 g/dL (ref 11.7–15.5)
Lymphs Abs: 1534 cells/uL (ref 850–3900)
MCH: 33.1 pg — ABNORMAL HIGH (ref 27.0–33.0)
MCHC: 33.7 g/dL (ref 32.0–36.0)
MCV: 98.1 fL (ref 80.0–100.0)
MPV: 9.9 fL (ref 7.5–12.5)
Monocytes Relative: 11.6 %
Neutro Abs: 4601 cells/uL (ref 1500–7800)
Neutrophils Relative %: 63.9 %
Platelets: 399 10*3/uL (ref 140–400)
RBC: 3.6 10*6/uL — ABNORMAL LOW (ref 3.80–5.10)
RDW: 12.6 % (ref 11.0–15.0)
Total Lymphocyte: 21.3 %
WBC: 7.2 10*3/uL (ref 3.8–10.8)

## 2021-06-18 LAB — LIPASE: Lipase: 66 U/L — ABNORMAL HIGH (ref 7–60)

## 2021-06-18 MED ORDER — DULOXETINE HCL 30 MG PO CPEP: 90.0000 mg | ORAL_CAPSULE | Freq: Every day | ORAL | 0 refills | Status: DC

## 2021-06-18 MED ORDER — DULOXETINE HCL 30 MG PO CPEP: 90.0000 mg | ORAL_CAPSULE | Freq: Every day | ORAL | 1 refills | Status: DC

## 2021-06-18 NOTE — Progress Notes (Signed)
WBC improved and normal now.  Sodium and potassium normal.  Albumin still a little low but after a few weeks of eating again should come up. Keep up with nutrition Lipase trending down.  Labs very reassuring.

## 2021-06-18 NOTE — Addendum Note (Signed)
Addended by: Fonnie Mu on: 06/18/2021 02:28 PM   Modules accepted: Orders

## 2021-06-18 NOTE — Telephone Encounter (Signed)
New RX for 90 day supply sent to [facility] order pharmacy.  Charyl Bigger, CMA

## 2021-06-19 LAB — URINE CULTURE
MICRO NUMBER:: 12829700
Result:: NO GROWTH
SPECIMEN QUALITY:: ADEQUATE

## 2021-06-19 NOTE — Progress Notes (Signed)
Infection cleared. No growth on urine culture.

## 2021-06-22 ENCOUNTER — Telehealth: Payer: Self-pay

## 2021-06-22 DIAGNOSIS — R102 Pelvic and perineal pain: Secondary | ICD-10-CM | POA: Diagnosis not present

## 2021-06-22 DIAGNOSIS — N200 Calculus of kidney: Secondary | ICD-10-CM | POA: Diagnosis not present

## 2021-06-22 DIAGNOSIS — R350 Frequency of micturition: Secondary | ICD-10-CM | POA: Diagnosis not present

## 2021-06-22 DIAGNOSIS — Z09 Encounter for follow-up examination after completed treatment for conditions other than malignant neoplasm: Secondary | ICD-10-CM | POA: Diagnosis not present

## 2021-06-22 DIAGNOSIS — N281 Cyst of kidney, acquired: Secondary | ICD-10-CM | POA: Diagnosis not present

## 2021-06-22 DIAGNOSIS — N3010 Interstitial cystitis (chronic) without hematuria: Secondary | ICD-10-CM | POA: Diagnosis not present

## 2021-06-22 DIAGNOSIS — R3915 Urgency of urination: Secondary | ICD-10-CM | POA: Diagnosis not present

## 2021-06-22 DIAGNOSIS — N12 Tubulo-interstitial nephritis, not specified as acute or chronic: Secondary | ICD-10-CM | POA: Diagnosis not present

## 2021-06-22 NOTE — Telephone Encounter (Signed)
Medication: DULoxetine (CYMBALTA) 30 MG capsule Prior authorization submitted via CoverMyMeds on 06/22/2021 PA submission pending

## 2021-06-23 DIAGNOSIS — Z79891 Long term (current) use of opiate analgesic: Secondary | ICD-10-CM | POA: Diagnosis not present

## 2021-06-23 DIAGNOSIS — Z79899 Other long term (current) drug therapy: Secondary | ICD-10-CM | POA: Diagnosis not present

## 2021-06-23 DIAGNOSIS — M5459 Other low back pain: Secondary | ICD-10-CM | POA: Diagnosis not present

## 2021-06-23 DIAGNOSIS — R69 Illness, unspecified: Secondary | ICD-10-CM | POA: Diagnosis not present

## 2021-06-23 NOTE — Telephone Encounter (Signed)
Medication: DULoxetine (CYMBALTA) 30 MG capsule Prior authorization determination received Medication has been approved Approval dates: 06/14/2021-06/13/2022  Patient aware via: [place] aware: Yes Provider aware via this encounter

## 2021-06-24 ENCOUNTER — Telehealth: Payer: Self-pay

## 2021-06-24 NOTE — Telephone Encounter (Signed)
Spoke  to pt in regards to her referral for [facility] at [facility] in [facility], gave pt details to contact them back to schedule her appointment. Pt expressed understanding.

## 2021-06-25 ENCOUNTER — Telehealth: Payer: Self-pay | Admitting: Internal Medicine

## 2021-06-25 DIAGNOSIS — R9389 Abnormal findings on diagnostic imaging of other specified body structures: Secondary | ICD-10-CM | POA: Diagnosis not present

## 2021-06-25 DIAGNOSIS — R69 Illness, unspecified: Secondary | ICD-10-CM | POA: Diagnosis not present

## 2021-06-25 DIAGNOSIS — G4752 REM sleep behavior disorder: Secondary | ICD-10-CM | POA: Diagnosis not present

## 2021-06-25 DIAGNOSIS — F8081 Childhood onset fluency disorder: Secondary | ICD-10-CM | POA: Diagnosis not present

## 2021-06-25 NOTE — Telephone Encounter (Signed)
Good morning Dr. Hilarie Fredrickson!  This patient saw you for an EGD in 2014 and then saw a Dr. Georgiann Cocker from [facility] in [place] in 2017.  Patient has been referred to our practice due to issues of a CBD dilatation and a fatty liver.  These records are in [place].  She wishes to transfer her care to you.  Please let me know if you approve the transfer.    Thank you.

## 2021-07-01 ENCOUNTER — Telehealth: Payer: Self-pay

## 2021-07-01 ENCOUNTER — Other Ambulatory Visit: Payer: Self-pay

## 2021-07-01 DIAGNOSIS — K52832 Lymphocytic colitis: Secondary | ICD-10-CM

## 2021-07-01 NOTE — Telephone Encounter (Signed)
Pt left vm in regards  to getting another refferal for digestive health,Called pt back to get further details on where pt would like to go left detailed vm for her to call back, referral placed in system.

## 2021-07-01 NOTE — Telephone Encounter (Signed)
Request received to transfer GI care from outside practice to [facility].  We appreciate the interest in our practice, however at this time due to high demand from patients without established GI providers we cannot accommodate this transfer.  Ability to accommodate future transfer requests may change over time and the patient can contact us again in 6-12 months if still interested in being seen at [facility].      °

## 2021-07-02 NOTE — Telephone Encounter (Signed)
Called pt and left detailed vm,Pt was referred  to digestive health in [place] on 07/01/20 called to give details.

## 2021-07-06 ENCOUNTER — Encounter: Payer: Self-pay | Admitting: Physician Assistant

## 2021-07-16 DIAGNOSIS — R935 Abnormal findings on diagnostic imaging of other abdominal regions, including retroperitoneum: Secondary | ICD-10-CM | POA: Diagnosis not present

## 2021-07-16 DIAGNOSIS — K838 Other specified diseases of biliary tract: Secondary | ICD-10-CM | POA: Diagnosis not present

## 2021-07-16 DIAGNOSIS — R634 Abnormal weight loss: Secondary | ICD-10-CM | POA: Diagnosis not present

## 2021-07-16 DIAGNOSIS — R109 Unspecified abdominal pain: Secondary | ICD-10-CM | POA: Diagnosis not present

## 2021-07-16 DIAGNOSIS — R748 Abnormal levels of other serum enzymes: Secondary | ICD-10-CM | POA: Diagnosis not present

## 2021-07-20 DIAGNOSIS — K838 Other specified diseases of biliary tract: Secondary | ICD-10-CM | POA: Diagnosis not present

## 2021-07-20 DIAGNOSIS — R109 Unspecified abdominal pain: Secondary | ICD-10-CM | POA: Diagnosis not present

## 2021-07-20 DIAGNOSIS — R935 Abnormal findings on diagnostic imaging of other abdominal regions, including retroperitoneum: Secondary | ICD-10-CM | POA: Diagnosis not present

## 2021-07-20 DIAGNOSIS — R748 Abnormal levels of other serum enzymes: Secondary | ICD-10-CM | POA: Diagnosis not present

## 2021-07-20 DIAGNOSIS — R634 Abnormal weight loss: Secondary | ICD-10-CM | POA: Diagnosis not present

## 2021-07-24 DIAGNOSIS — M5459 Other low back pain: Secondary | ICD-10-CM | POA: Diagnosis not present

## 2021-07-24 DIAGNOSIS — Z79899 Other long term (current) drug therapy: Secondary | ICD-10-CM | POA: Diagnosis not present

## 2021-07-24 DIAGNOSIS — R69 Illness, unspecified: Secondary | ICD-10-CM | POA: Diagnosis not present

## 2021-07-24 DIAGNOSIS — Z79891 Long term (current) use of opiate analgesic: Secondary | ICD-10-CM | POA: Diagnosis not present

## 2021-07-29 ENCOUNTER — Encounter: Payer: Self-pay | Admitting: Physician Assistant

## 2021-07-29 MED ORDER — FLUCONAZOLE 150 MG PO TABS: 150.0000 mg | ORAL_TABLET | Freq: Once | ORAL | 0 refills | Status: AC

## 2021-08-04 DIAGNOSIS — Z79899 Other long term (current) drug therapy: Secondary | ICD-10-CM | POA: Diagnosis not present

## 2021-08-04 DIAGNOSIS — R748 Abnormal levels of other serum enzymes: Secondary | ICD-10-CM | POA: Diagnosis not present

## 2021-08-04 DIAGNOSIS — R935 Abnormal findings on diagnostic imaging of other abdominal regions, including retroperitoneum: Secondary | ICD-10-CM | POA: Diagnosis not present

## 2021-08-04 DIAGNOSIS — Z888 Allergy status to other drugs, medicaments and biological substances status: Secondary | ICD-10-CM | POA: Diagnosis not present

## 2021-08-04 DIAGNOSIS — Z6825 Body mass index (BMI) 25.0-25.9, adult: Secondary | ICD-10-CM | POA: Diagnosis not present

## 2021-08-04 DIAGNOSIS — R69 Illness, unspecified: Secondary | ICD-10-CM | POA: Diagnosis not present

## 2021-08-04 DIAGNOSIS — K589 Irritable bowel syndrome without diarrhea: Secondary | ICD-10-CM | POA: Diagnosis not present

## 2021-08-04 DIAGNOSIS — Z87891 Personal history of nicotine dependence: Secondary | ICD-10-CM | POA: Diagnosis not present

## 2021-08-04 DIAGNOSIS — Z882 Allergy status to sulfonamides status: Secondary | ICD-10-CM | POA: Diagnosis not present

## 2021-08-04 DIAGNOSIS — R634 Abnormal weight loss: Secondary | ICD-10-CM | POA: Diagnosis not present

## 2021-08-04 DIAGNOSIS — I341 Nonrheumatic mitral (valve) prolapse: Secondary | ICD-10-CM | POA: Diagnosis not present

## 2021-08-04 DIAGNOSIS — Z8541 Personal history of malignant neoplasm of cervix uteri: Secondary | ICD-10-CM | POA: Diagnosis not present

## 2021-08-04 DIAGNOSIS — K219 Gastro-esophageal reflux disease without esophagitis: Secondary | ICD-10-CM | POA: Diagnosis not present

## 2021-08-04 DIAGNOSIS — K862 Cyst of pancreas: Secondary | ICD-10-CM | POA: Diagnosis not present

## 2021-08-04 DIAGNOSIS — Z79890 Hormone replacement therapy (postmenopausal): Secondary | ICD-10-CM | POA: Diagnosis not present

## 2021-08-04 DIAGNOSIS — Z7951 Long term (current) use of inhaled steroids: Secondary | ICD-10-CM | POA: Diagnosis not present

## 2021-08-04 DIAGNOSIS — R109 Unspecified abdominal pain: Secondary | ICD-10-CM | POA: Diagnosis not present

## 2021-08-04 DIAGNOSIS — K838 Other specified diseases of biliary tract: Secondary | ICD-10-CM | POA: Diagnosis not present

## 2021-08-05 DIAGNOSIS — D485 Neoplasm of uncertain behavior of skin: Secondary | ICD-10-CM | POA: Diagnosis not present

## 2021-08-05 DIAGNOSIS — R233 Spontaneous ecchymoses: Secondary | ICD-10-CM | POA: Diagnosis not present

## 2021-08-05 DIAGNOSIS — L989 Disorder of the skin and subcutaneous tissue, unspecified: Secondary | ICD-10-CM | POA: Diagnosis not present

## 2021-08-10 ENCOUNTER — Other Ambulatory Visit: Payer: Self-pay | Admitting: Physician Assistant

## 2021-08-10 ENCOUNTER — Encounter: Payer: Self-pay | Admitting: Physician Assistant

## 2021-08-10 DIAGNOSIS — Z1231 Encounter for screening mammogram for malignant neoplasm of breast: Secondary | ICD-10-CM

## 2021-08-12 DIAGNOSIS — R531 Weakness: Secondary | ICD-10-CM | POA: Diagnosis not present

## 2021-08-13 DIAGNOSIS — R339 Retention of urine, unspecified: Secondary | ICD-10-CM | POA: Diagnosis not present

## 2021-08-17 ENCOUNTER — Ambulatory Visit (INDEPENDENT_AMBULATORY_CARE_PROVIDER_SITE_OTHER): Payer: Medicare HMO | Admitting: Physician Assistant

## 2021-08-17 ENCOUNTER — Other Ambulatory Visit: Payer: Self-pay

## 2021-08-17 ENCOUNTER — Other Ambulatory Visit: Payer: Self-pay | Admitting: Physician Assistant

## 2021-08-17 VITALS — BP 126/84 | HR 96 | Ht 66.0 in | Wt 154.0 lb

## 2021-08-17 DIAGNOSIS — N898 Other specified noninflammatory disorders of vagina: Secondary | ICD-10-CM

## 2021-08-17 DIAGNOSIS — Z13220 Encounter for screening for lipoid disorders: Secondary | ICD-10-CM

## 2021-08-17 DIAGNOSIS — E785 Hyperlipidemia, unspecified: Secondary | ICD-10-CM | POA: Diagnosis not present

## 2021-08-17 DIAGNOSIS — Z0000 Encounter for general adult medical examination without abnormal findings: Secondary | ICD-10-CM

## 2021-08-17 DIAGNOSIS — N3011 Interstitial cystitis (chronic) with hematuria: Secondary | ICD-10-CM

## 2021-08-17 DIAGNOSIS — R8290 Unspecified abnormal findings in urine: Secondary | ICD-10-CM | POA: Diagnosis not present

## 2021-08-17 LAB — POCT URINALYSIS DIP (CLINITEK)
Bilirubin, UA: NEGATIVE
Glucose, UA: NEGATIVE mg/dL
Ketones, POC UA: NEGATIVE mg/dL
Nitrite, UA: NEGATIVE
POC PROTEIN,UA: NEGATIVE
Spec Grav, UA: 1.015 (ref 1.010–1.025)
Urobilinogen, UA: 0.2 E.U./dL
pH, UA: 7 (ref 5.0–8.0)

## 2021-08-17 NOTE — Patient Instructions (Signed)

## 2021-08-17 NOTE — Progress Notes (Signed)
Subjective:  ?  ? DAYSIE HELF is a 56 y.o. female and is here for a comprehensive physical exam. The patient reports no problems. ? ?Social History  ? ?Socioeconomic History  ? Marital status: Married  ?  Spouse name: Legrand Como  ? Number of children: 2  ? Years of education: 27  ? Highest education level: 12th grade  ?Occupational History  ? Occupation: Legally disabled  ?Tobacco Use  ? Smoking status: Every Day  ?  Packs/day: 1.00  ?  Years: 25.00  ?  Pack years: 25.00  ?  Types: Cigarettes  ? Smokeless tobacco: Never  ?Vaping Use  ? Vaping Use: Never used  ?Substance and Sexual Activity  ? Alcohol use: Yes  ?  Comment: rarely  ? Drug use: No  ? Sexual activity: Yes  ?  Partners: Male  ?Other Topics Concern  ? Not on file  ?Social History Narrative  ? Lives with her husband. She has two children. She enjoys collecting gemstones and selling them.  ? ?Social Determinants of Health  ? ?Financial Resource Strain: Low Risk   ? Difficulty of Paying Living Expenses: Not hard at all  ?Food Insecurity: No Food Insecurity  ? Worried About Charity fundraiser in the Last Year: Never true  ? Ran Out of Food in the Last Year: Never true  ?Transportation Needs: No Transportation Needs  ? Lack of Transportation (Medical): No  ? Lack of Transportation (Non-Medical): No  ?Physical Activity: Insufficiently Active  ? Days of Exercise per Week: 3 days  ? Minutes of Exercise per Session: 30 min  ?Stress: Stress Concern Present  ? Feeling of Stress : To some extent  ?Social Connections: Moderately Isolated  ? Frequency of Communication with Friends and Family: Twice a week  ? Frequency of Social Gatherings with Friends and Family: Twice a week  ? Attends Religious Services: Never  ? Active Member of Clubs or Organizations: No  ? Attends Archivist Meetings: Never  ? Marital Status: Married  ?Intimate Partner Violence: Not At Risk  ? Fear of Current or Ex-Partner: No  ? Emotionally Abused: No  ? Physically Abused: No  ?  Sexually Abused: No  ? ?Health Maintenance  ?Topic Date Due  ? TETANUS/TDAP  09/07/2021  ? MAMMOGRAM  09/04/2022  ? COLONOSCOPY (Pts 45-69yr Insurance coverage will need to be confirmed)  04/02/2025  ? INFLUENZA VACCINE  Completed  ? Hepatitis C Screening  Completed  ? HIV Screening  Completed  ? HPV VACCINES  Aged Out  ? COVID-19 Vaccine  Discontinued  ? Zoster Vaccines- Shingrix  Discontinued  ? ? ?The following portions of the patient's history were reviewed and updated as appropriate: allergies, current medications, past family history, past medical history, past social history, past surgical history, and problem list. ? ?Review of Systems ?A comprehensive review of systems was negative.  ? ?Objective:  ? ? BP 126/84   Pulse 96   Ht '5\' 6"'$  (1.676 m)   Wt 154 lb (69.9 kg)   SpO2 96%   BMI 24.86 kg/m?  ?General appearance: alert, cooperative, and appears stated age ?Head: Normocephalic, without obvious abnormality, atraumatic ?Eyes: conjunctivae/corneas clear. PERRL, EOM's intact. Fundi benign. ?Ears: normal TM's and external ear canals both ears ?Nose: Nares normal. Septum midline. Mucosa normal. No drainage or sinus tenderness. ?Throat: lips, mucosa, and tongue normal; teeth and gums normal ?Neck: no adenopathy, no carotid bruit, no JVD, supple, symmetrical, trachea midline, and thyroid not enlarged, symmetric,  no tenderness/mass/nodules ?Back: symmetric, no curvature. ROM normal. No CVA tenderness. ?Lungs: clear to auscultation bilaterally ?Heart: regular rate and rhythm, S1, S2 normal, no murmur, click, rub or gallop ?Abdomen:  generalized tenderness in all 4 quadrants ?Extremities: extremities normal, atraumatic, no cyanosis or edema ?Pulses: 2+ and symmetric ?Lymph nodes: Cervical, supraclavicular, and axillary nodes normal. ?Neurologic: Alert and oriented X 3, normal strength and tone. Normal symmetric reflexes. Normal coordination and gait  ? [name] KitchenMarland Kitchen ?Depression screen [facility] 2/9 08/17/2021 03/16/2021 08/29/2020  04/02/2019 04/22/2018  ?Decreased Interest '1 1 2 2 '$ 0  ?Down, Depressed, Hopeless '2 1 2 2 1  '$ ?PHQ - 2 Score '3 2 4 4 1  '$ ?Altered sleeping '3 3 1 2 1  '$ ?Tired, decreased energy '1 1 1 1 1  '$ ?Change in appetite 0 '1 2 1 '$ 0  ?Feeling bad or failure about yourself  1 0 2 2 0  ?Trouble concentrating 0 0 1 0 0  ?Moving slowly or fidgety/restless 0 0 1 0 0  ?Suicidal thoughts 0 0 1 0 0  ?PHQ-9 Score '8 7 13 10 3  '$ ?Difficult doing work/chores Somewhat difficult Not difficult at all Somewhat difficult Very difficult -  ?Some recent data might be hidden  ? ?.. ?GAD 7 : Generalized Anxiety Score 08/17/2021 08/29/2020 04/02/2019 04/22/2018  ?Nervous, Anxious, on Edge '1 2 1 1  '$ ?Control/stop worrying '1 2 2 1  '$ ?Worry too much - different things '1 2 2 1  '$ ?Trouble relaxing '1 1 1 1  '$ ?Restless 0 '1 1 1  '$ ?Easily annoyed or irritable '3 2 2 1  '$ ?Afraid - awful might happen '1 2 1 '$ 0  ?Total GAD 7 Score '8 12 10 6  '$ ?Anxiety Difficulty Somewhat difficult Somewhat difficult Somewhat difficult Somewhat difficult  ? ? ? ?Assessment:  ? ? Healthy female exam.   ?  ?Plan:  ?..Deana was seen today for annual exam. ? ?Diagnoses and all orders for this visit: ? ?Routine physical examination ?-     Lipid Panel w/reflex Direct LDL ? ?Abnormal urine odor ?-     POCT URINALYSIS DIP (CLINITEK) ?-     Urine Culture ? ?Interstitial cystitis (chronic) with hematuria ? ?Vaginal discharge ?-     SureSwab? Advanced Vaginitis Plus,TMA ? ?Screening for lipid disorders ?-     Lipid Panel w/reflex Direct LDL ? ? ?.. ?Discussed 150 minutes of exercise a week.  ?Encouraged vitamin D 1000 units and Calcium '1300mg'$  or 4 servings of dairy a day.  ?Fasting labs ordered ?PHQ/GAD numbers not to goal.pt declined any intervention. ?Pt declined smoking cessation ?Mammogram scheduled ?Hysterectomy and no need for pap.  ?Colonoscopy done.  ?Declines covid vaccine. ?Flu UTD. ?Encouraged shingles vaccine. Pt declined. ? ?Hx of IC.  ?UA some leuks ?Will get sure swab and culture ? ?  ?See After  Visit Summary for Counseling Recommendations  ? ?

## 2021-08-18 ENCOUNTER — Encounter: Payer: Self-pay | Admitting: Physician Assistant

## 2021-08-18 LAB — SURESWAB® ADVANCED VAGINITIS PLUS,TMA
C. trachomatis RNA, TMA: NOT DETECTED
CANDIDA SPECIES: NOT DETECTED
Candida glabrata: DETECTED — AB
N. gonorrhoeae RNA, TMA: NOT DETECTED
SURESWAB(R) ADV BACTERIAL VAGINOSIS(BV),TMA: NEGATIVE
TRICHOMONAS VAGINALIS (TV),TMA: NOT DETECTED

## 2021-08-18 LAB — LIPID PANEL W/REFLEX DIRECT LDL
Cholesterol: 186 mg/dL (ref <200)
HDL: 66 mg/dL (ref >=50)
LDL Cholesterol (Calc): 100 mg/dL (calc) — ABNORMAL HIGH
Non-HDL Cholesterol (Calc): 120 mg/dL (calc) (ref <130)
Total CHOL/HDL Ratio: 2.8 (calc) (ref <5.0)
Triglycerides: 104 mg/dL (ref <150)

## 2021-08-18 NOTE — Progress Notes (Signed)
HDL, good cholesterol, is GREAT.  ?TG look good.  ?LDL looks good as well!  ? ?Great cholesterol!

## 2021-08-19 ENCOUNTER — Other Ambulatory Visit: Payer: Self-pay | Admitting: Physician Assistant

## 2021-08-19 LAB — URINE CULTURE
MICRO NUMBER:: 13096237
SPECIMEN QUALITY:: ADEQUATE

## 2021-08-19 MED ORDER — FLUCONAZOLE 150 MG PO TABS: ORAL_TABLET | ORAL | 0 refills | Status: DC

## 2021-08-19 NOTE — Progress Notes (Signed)
Yeast detected. Will treat with diflucan every other day x 3.  ?No significant bacteria accumulation.

## 2021-08-28 DIAGNOSIS — R69 Illness, unspecified: Secondary | ICD-10-CM | POA: Diagnosis not present

## 2021-08-28 DIAGNOSIS — Z79899 Other long term (current) drug therapy: Secondary | ICD-10-CM | POA: Diagnosis not present

## 2021-08-28 DIAGNOSIS — Z79891 Long term (current) use of opiate analgesic: Secondary | ICD-10-CM | POA: Diagnosis not present

## 2021-08-28 DIAGNOSIS — M5459 Other low back pain: Secondary | ICD-10-CM | POA: Diagnosis not present

## 2021-09-01 DIAGNOSIS — Z79899 Other long term (current) drug therapy: Secondary | ICD-10-CM | POA: Diagnosis not present

## 2021-09-07 ENCOUNTER — Encounter: Payer: Self-pay | Admitting: Physician Assistant

## 2021-09-07 DIAGNOSIS — K449 Diaphragmatic hernia without obstruction or gangrene: Secondary | ICD-10-CM

## 2021-09-07 DIAGNOSIS — Z8711 Personal history of peptic ulcer disease: Secondary | ICD-10-CM

## 2021-09-07 DIAGNOSIS — R110 Nausea: Secondary | ICD-10-CM

## 2021-09-08 MED ORDER — PROMETHAZINE HCL 25 MG PO TABS: ORAL_TABLET | ORAL | 1 refills | Status: DC

## 2021-09-08 MED ORDER — PANTOPRAZOLE SODIUM 40 MG PO TBEC: 40.0000 mg | DELAYED_RELEASE_TABLET | Freq: Every day | ORAL | 3 refills | Status: DC

## 2021-09-10 ENCOUNTER — Ambulatory Visit (INDEPENDENT_AMBULATORY_CARE_PROVIDER_SITE_OTHER): Payer: Medicare HMO

## 2021-09-10 DIAGNOSIS — Z1231 Encounter for screening mammogram for malignant neoplasm of breast: Secondary | ICD-10-CM | POA: Diagnosis not present

## 2021-09-14 NOTE — Progress Notes (Signed)
Normal mammogram. Follow up in 1 year.

## 2021-09-29 DIAGNOSIS — R69 Illness, unspecified: Secondary | ICD-10-CM | POA: Diagnosis not present

## 2021-09-29 DIAGNOSIS — Z79899 Other long term (current) drug therapy: Secondary | ICD-10-CM | POA: Diagnosis not present

## 2021-09-29 DIAGNOSIS — M5459 Other low back pain: Secondary | ICD-10-CM | POA: Diagnosis not present

## 2021-09-29 DIAGNOSIS — Z6825 Body mass index (BMI) 25.0-25.9, adult: Secondary | ICD-10-CM | POA: Diagnosis not present

## 2021-10-07 DIAGNOSIS — R339 Retention of urine, unspecified: Secondary | ICD-10-CM | POA: Diagnosis not present

## 2021-10-29 DIAGNOSIS — Z79899 Other long term (current) drug therapy: Secondary | ICD-10-CM | POA: Diagnosis not present

## 2021-10-29 DIAGNOSIS — Z6825 Body mass index (BMI) 25.0-25.9, adult: Secondary | ICD-10-CM | POA: Diagnosis not present

## 2021-10-29 DIAGNOSIS — M5459 Other low back pain: Secondary | ICD-10-CM | POA: Diagnosis not present

## 2021-10-29 DIAGNOSIS — R69 Illness, unspecified: Secondary | ICD-10-CM | POA: Diagnosis not present

## 2021-10-29 DIAGNOSIS — I10 Essential (primary) hypertension: Secondary | ICD-10-CM | POA: Diagnosis not present

## 2021-11-06 DIAGNOSIS — R339 Retention of urine, unspecified: Secondary | ICD-10-CM | POA: Diagnosis not present

## 2021-11-10 DIAGNOSIS — L950 Livedoid vasculitis: Secondary | ICD-10-CM | POA: Diagnosis not present

## 2021-11-10 DIAGNOSIS — L853 Xerosis cutis: Secondary | ICD-10-CM | POA: Diagnosis not present

## 2021-11-20 ENCOUNTER — Ambulatory Visit (INDEPENDENT_AMBULATORY_CARE_PROVIDER_SITE_OTHER): Payer: Medicare HMO | Admitting: Physician Assistant

## 2021-11-20 VITALS — BP 88/60 | HR 75 | Temp 97.6°F | Ht 66.0 in | Wt 153.0 lb

## 2021-11-20 DIAGNOSIS — R630 Anorexia: Secondary | ICD-10-CM

## 2021-11-20 DIAGNOSIS — R300 Dysuria: Secondary | ICD-10-CM | POA: Diagnosis not present

## 2021-11-20 DIAGNOSIS — I959 Hypotension, unspecified: Secondary | ICD-10-CM

## 2021-11-20 DIAGNOSIS — R1032 Left lower quadrant pain: Secondary | ICD-10-CM | POA: Diagnosis not present

## 2021-11-20 DIAGNOSIS — N3011 Interstitial cystitis (chronic) with hematuria: Secondary | ICD-10-CM | POA: Diagnosis not present

## 2021-11-20 DIAGNOSIS — Z87448 Personal history of other diseases of urinary system: Secondary | ICD-10-CM

## 2021-11-20 DIAGNOSIS — G8929 Other chronic pain: Secondary | ICD-10-CM | POA: Diagnosis not present

## 2021-11-20 DIAGNOSIS — R1031 Right lower quadrant pain: Secondary | ICD-10-CM | POA: Diagnosis not present

## 2021-11-20 DIAGNOSIS — R5381 Other malaise: Secondary | ICD-10-CM | POA: Diagnosis not present

## 2021-11-20 MED ORDER — CEFTRIAXONE SODIUM 1 G IJ SOLR
1.0000 g | Freq: Once | INTRAMUSCULAR | Status: AC
  Administered 2021-11-20: 1 g via INTRAMUSCULAR

## 2021-11-20 MED ORDER — CIPROFLOXACIN HCL 500 MG PO TABS: 500.0000 mg | ORAL_TABLET | Freq: Two times a day (BID) | ORAL | 0 refills | Status: DC

## 2021-11-20 NOTE — Patient Instructions (Addendum)
Start cipro Get labs Pyelonephritis, Adult Pyelonephritis is an infection that occurs in the kidney. The kidneys are the organs that filter a person's blood and move waste out of the bloodstream and into the urine. Urine passes from the kidneys, through tubes called ureters, and into the bladder. There are two main types of pyelonephritis: Infections that come on quickly without any warning (acute pyelonephritis). Infections that last for a long period of time (chronic pyelonephritis). In most cases, the infection clears up with treatment and does not cause further problems. More severe infections or chronic infections can sometimes spread to the bloodstream or lead to other problems with the kidneys. What are the causes? This condition is usually caused by: Bacteria traveling from the bladder up to the kidney. This may occur after having a bladder infection (cystitis) or urinary tract infection (UTI). Bladder infections caused from bacteria traveling from the bloodstream to the kidney. What increases the risk? This condition is more likely to develop in: Pregnant women. Older people. People who have any of these conditions: Diabetes. Inflammation of the prostate gland (prostatitis), in males. Kidney stones or bladder stones. Other abnormalities of the kidney or ureter. Cancer. People who have a catheter placed in the bladder. People who are sexually active. Women who use spermicides. People who have had a prior UTI. What are the signs or symptoms? Symptoms of this condition include: Frequent urination. Strong or persistent urge to urinate. Burning or stinging when urinating. Abdominal pain. Back pain. Pain in the side or flank area. Fever or chills. Blood in the urine, or dark urine. Nausea or vomiting. How is this diagnosed? This condition may be diagnosed based on: Your medical history and a physical exam. Urine tests. Blood tests. You may also have imaging tests of the  kidneys, such as an ultrasound or CT scan. How is this treated? Treatment for this condition may depend on the severity of the infection. If the infection is mild and is found early, you may be treated with antibiotic medicines taken by mouth (orally). You will need to drink fluids to remain hydrated. If the infection is more severe, you may need to stay in the hospital and receive antibiotics given directly into a vein through an IV. You may also need to receive fluids through an IV if you are not able to remain hydrated. After your hospital stay, you may need to take oral antibiotics for a period of time. Other treatments may be required, depending on the cause of the infection. Follow these instructions at home: Medicines Take your antibiotic medicine as told by your health care provider. Do not stop taking the antibiotic even if you start to feel better. Take over-the-counter and prescription medicines only as told by your health care provider. General instructions  Drink enough fluid to keep your urine pale yellow. Avoid caffeine, tea, and carbonated beverages. They tend to irritate the bladder. Urinate often. Avoid holding in urine for long periods of time. Urinate before and after sex. After a bowel movement, women should cleanse from front to back. Use each tissue only once. Keep all follow-up visits as told by your health care provider. This is important. Contact a health care provider if: Your symptoms do not get better after 2 days of treatment. Your symptoms get worse. You have a fever. Get help right away if you: Are unable to take your antibiotics or fluids. Have shaking chills. Vomit. Have severe flank or back pain. Have extreme weakness or fainting. Summary Pyelonephritis is  a urinary tract infection (UTI) that occurs in the kidney. Treatment for this condition may depend on the severity of the infection. Take your antibiotic medicine as told by your health care  provider. Do not stop taking the antibiotic even if you start to feel better. Drink enough fluid to keep your urine pale yellow. Keep all follow-up visits as told by your health care provider. This is important. This information is not intended to replace advice given to you by your health care provider. Make sure you discuss any questions you have with your health care provider. Document Revised: 01/08/2021 Document Reviewed: 01/08/2021 Elsevier Patient Education  [place].

## 2021-11-20 NOTE — Progress Notes (Signed)
Acute Office Visit  Subjective:     Patient ID: [name], female    DOB: [birth year], 56 y.o.   MRN: [id number]  Chief Complaint  Patient presents with   Abdominal Pain   Dysuria    HPI Patient is in today for 3 days of acute and constant dysuria, lower abdominal pain, fatigue, no appetite. She has long history of IC and managed by Dr. Amalia [name] at [facility]. For management her husband is doing lidocaine caths twice a day. She had similar symptoms for about 6 months ago when she went to ED and was found to have pyelonephritis. She is concerned about reoccurrence.   No fever, chills, body aches, flank pain.   .. Active Ambulatory Problems    Diagnosis Date Noted   TOBACCO ABUSE 05/28/2009   MITRAL VALVE PROLAPSE 05/28/2009   UNSPECIFIED CARDIAC DYSRHYTHMIA 05/16/2009   TMJ PAIN 07/26/2008   GASTRIC ULCER 05/26/2009   NEPHROLITHIASIS 03/06/2010   HEMATURIA UNSPECIFIED 12/19/2008   OVARIAN CYST 03/06/2010   PALPITATIONS 05/16/2009   HEART MURMUR, HX OF 05/26/2009   Depressive disorder, not elsewhere classified 11/06/2010   Lymphocytic colitis 12/01/2011   EBV infection 04/21/2012   Chronic pain syndrome 06/21/2012   Fibromyalgia 06/21/2012   Carpal tunnel syndrome, bilateral 06/21/2012   Degenerative disc disease, cervical 06/21/2012   Facet arthropathy, cervical 06/21/2012   DDD (degenerative disc disease), lumbosacral 06/21/2012   Lower abdominal pain 07/26/2012   Interstitial cystitis (chronic) with hematuria 04/13/2013   Primary osteoarthritis of right knee 12/20/2013   Incontinence in female 06/26/2014   Acute bilateral lower abdominal pain 06/26/2014   Pain in joint, ankle and foot 04/21/2015   Status post implantation of urinary electronic stimulator device 05/05/2015   Post-menopausal 06/20/2015   Family history of cardiovascular disease 06/20/2015   Drug induced constipation 06/20/2015   No energy 06/20/2015   Dysuria 06/20/2015   Hiatal hernia 07/16/2015    Depression 09/03/2015   Primary osteoarthritis of left hand 02/27/2016   Breast pain, right 09/14/2016   Abnormal weight gain 03/16/2017   BMI 27.0-27.9,adult 03/16/2017   History of gastric ulcer 04/09/2019   RLS (restless legs syndrome) 04/09/2019   Mild episode of recurrent major depressive disorder ([place]) 03/17/2021   Stuttering 03/17/2021   Elevated LDL cholesterol level 03/17/2021   Word finding difficulty 03/17/2021   Sleep talking 03/17/2021   Abnormal MRI of head 05/19/2021   Chronic, continuous use of opioids 09/28/2013   Smoker 05/28/2009   Hyponatremia 06/10/2021   Anorexia 06/10/2021   Adult failure to thrive 06/10/2021   Acute lower UTI 06/10/2021   Elevated lipase 06/17/2021   Fatty liver 06/17/2021   Common bile duct dilatation 06/17/2021   Hypotension 11/20/2021   Malaise 11/20/2021   History of pyelonephritis 11/20/2021   Resolved Ambulatory Problems    Diagnosis Date Noted   Other change of lacrimal passages 03/01/2010   URI 08/17/2008   STONE, URINARY CALCULUS,UNSPEC. 12/29/2008   FOOT PAIN, LEFT 04/14/2010   Diarrhea 11/04/2009   CONTUSION, LEFT HAND 05/10/2009   CHEMICAL BURN 04/14/2010   Nephrolithiasis 78/29/5621   Bacterial folliculitis 30/86/5784   Acute pharyngitis 01/30/2011   BRONCHITIS, ACUTE 02/04/2011   ABDOMINAL PAIN, ACUTE 02/04/2011   Past Medical History:  Diagnosis Date   Anxiety    Cancer ([place])    Colitis    Dysrhythmia    Gastric ulcer    Headache(784.0)    Heart murmur    IBS (irritable bowel syndrome)  Interstitial cystitis    MVP (mitral valve prolapse)    Urinary calculus or stone      ROS      Objective:    BP (!) 88/60   Pulse 75   Temp 97.6 F (36.4 C) (Oral)   Ht '5\' 6"'$  (1.676 m)   Wt 153 lb (69.4 kg)   SpO2 99%   BMI 24.69 kg/m  BP Readings from Last 3 Encounters:  11/20/21 (!) 88/60  08/17/21 126/84  06/17/21 129/88   Wt Readings from Last 3 Encounters:  11/20/21 153 lb (69.4 kg)  08/17/21  154 lb (69.9 kg)  06/17/21 156 lb (70.8 kg)      Physical Exam Vitals reviewed.  Constitutional:      Appearance: She is well-developed. She is obese.  HENT:     Head: Normocephalic.  Cardiovascular:     Rate and Rhythm: Normal rate.  Pulmonary:     Effort: Pulmonary effort is normal.  Abdominal:     Tenderness: There is generalized abdominal tenderness. There is guarding. There is no right CVA tenderness or left CVA tenderness.  Neurological:     General: No focal deficit present.     Mental Status: She is alert and oriented to person, place, and time.  Psychiatric:     Comments: tearful           Assessment & Plan:  [name] KitchenMarland KitchenLatangela was seen today for abdominal pain and dysuria.  Diagnoses and all orders for this visit:  Dysuria -     Cancel: POCT URINALYSIS DIP (CLINITEK) -     Urine Culture -     ciprofloxacin (CIPRO) 500 MG tablet; Take 1 tablet (500 mg total) by mouth 2 (two) times daily for 14 days. -     CBC with Differential/Platelet -     COMPLETE METABOLIC PANEL WITH GFR -     cefTRIAXone (ROCEPHIN) injection 1 g  Interstitial cystitis (chronic) with hematuria -     Cancel: POCT URINALYSIS DIP (CLINITEK) -     Urine Culture -     CBC with Differential/Platelet -     COMPLETE METABOLIC PANEL WITH GFR -     cefTRIAXone (ROCEPHIN) injection 1 g  Chronic bilateral lower abdominal pain -     Cancel: POCT URINALYSIS DIP (CLINITEK) -     Urine Culture -     cefTRIAXone (ROCEPHIN) injection 1 g  History of pyelonephritis -     ciprofloxacin (CIPRO) 500 MG tablet; Take 1 tablet (500 mg total) by mouth 2 (two) times daily for 14 days. -     CBC with Differential/Platelet -     COMPLETE METABOLIC PANEL WITH GFR -     cefTRIAXone (ROCEPHIN) injection 1 g  Anorexia -     ciprofloxacin (CIPRO) 500 MG tablet; Take 1 tablet (500 mg total) by mouth 2 (two) times daily for 14 days. -     CBC with Differential/Platelet -     COMPLETE METABOLIC PANEL WITH  GFR  Malaise -     ciprofloxacin (CIPRO) 500 MG tablet; Take 1 tablet (500 mg total) by mouth 2 (two) times daily for 14 days. -     CBC with Differential/Platelet -     COMPLETE METABOLIC PANEL WITH GFR  Hypotension, unspecified hypotension type  Acute bilateral lower abdominal pain -     ciprofloxacin (CIPRO) 500 MG tablet; Take 1 tablet (500 mg total) by mouth 2 (two) times daily for 14 days.  Attempted in  and out cath in office and not able to get any urine. Husband was present in office visit and suspected drain was not long enough or like his drains at home.   He will collect at home and bring into the office for culture.   Based on symptoms and history will start pyelonephritis treatment with  Rocephin and cipro. Cbc and cmp ordered today BP low needs to stay hydrated and eat salt HO given  If no improvement or develop fever or symptoms worsen go to ED.   Return if symptoms worsen or fail to improve.  Iran Planas, PA-C

## 2021-11-21 LAB — CBC WITH DIFFERENTIAL/PLATELET
Absolute Monocytes: 468 cells/uL (ref 200–950)
Basophils Absolute: 32 cells/uL (ref 0–200)
Basophils Relative: 0.7 %
Eosinophils Absolute: 99 cells/uL (ref 15–500)
Eosinophils Relative: 2.2 %
HCT: 38.7 % (ref 35.0–45.0)
Hemoglobin: 13.2 g/dL (ref 11.7–15.5)
Lymphs Abs: 1683 cells/uL (ref 850–3900)
MCH: 31.9 pg (ref 27.0–33.0)
MCHC: 34.1 g/dL (ref 32.0–36.0)
MCV: 93.5 fL (ref 80.0–100.0)
MPV: 10.3 fL (ref 7.5–12.5)
Monocytes Relative: 10.4 %
Neutro Abs: 2219 cells/uL (ref 1500–7800)
Neutrophils Relative %: 49.3 %
Platelets: 169 10*3/uL (ref 140–400)
RBC: 4.14 10*6/uL (ref 3.80–5.10)
RDW: 14.5 % (ref 11.0–15.0)
Total Lymphocyte: 37.4 %
WBC: 4.5 10*3/uL (ref 3.8–10.8)

## 2021-11-21 LAB — COMPLETE METABOLIC PANEL WITH GFR
AG Ratio: 1.4 (calc) (ref 1.0–2.5)
ALT: 98 U/L — ABNORMAL HIGH (ref 6–29)
AST: 173 U/L — ABNORMAL HIGH (ref 10–35)
Albumin: 4 g/dL (ref 3.6–5.1)
Alkaline phosphatase (APISO): 137 U/L (ref 37–153)
BUN/Creatinine Ratio: 12 (calc) (ref 6–22)
BUN: 14 mg/dL (ref 7–25)
CO2: 32 mmol/L (ref 20–32)
Calcium: 9.5 mg/dL (ref 8.6–10.4)
Chloride: 99 mmol/L (ref 98–110)
Creat: 1.18 mg/dL — ABNORMAL HIGH (ref 0.50–1.03)
Globulin: 2.9 g/dL (calc) (ref 1.9–3.7)
Glucose, Bld: 88 mg/dL (ref 65–99)
Potassium: 3 mmol/L — ABNORMAL LOW (ref 3.5–5.3)
Sodium: 141 mmol/L (ref 135–146)
Total Bilirubin: 0.7 mg/dL (ref 0.2–1.2)
Total Protein: 6.9 g/dL (ref 6.1–8.1)
eGFR: 54 mL/min/{1.73_m2} — ABNORMAL LOW (ref >=60)

## 2021-11-23 ENCOUNTER — Encounter: Payer: Self-pay | Admitting: Physician Assistant

## 2021-11-23 DIAGNOSIS — R1032 Left lower quadrant pain: Secondary | ICD-10-CM | POA: Diagnosis not present

## 2021-11-23 DIAGNOSIS — R1031 Right lower quadrant pain: Secondary | ICD-10-CM | POA: Diagnosis not present

## 2021-11-23 DIAGNOSIS — R300 Dysuria: Secondary | ICD-10-CM | POA: Diagnosis not present

## 2021-11-23 DIAGNOSIS — N3011 Interstitial cystitis (chronic) with hematuria: Secondary | ICD-10-CM | POA: Diagnosis not present

## 2021-11-23 DIAGNOSIS — G8929 Other chronic pain: Secondary | ICD-10-CM | POA: Diagnosis not present

## 2021-11-23 NOTE — Progress Notes (Signed)
WBC is normal.  Kidney function decreased, liver enzymes up, potassium down. If symptoms not improving need CT scan today. How are you feeling?

## 2021-11-24 ENCOUNTER — Encounter: Payer: Self-pay | Admitting: Physician Assistant

## 2021-11-24 LAB — URINE CULTURE
MICRO NUMBER:: 13514296
Result:: NO GROWTH
SPECIMEN QUALITY:: ADEQUATE

## 2021-11-24 NOTE — Telephone Encounter (Signed)
LVM for patient to call back to get appt scheduled for in the morning. AMUCK

## 2021-11-24 NOTE — Telephone Encounter (Signed)
Sent patient to ED

## 2021-11-24 NOTE — Telephone Encounter (Signed)
Will you double book them for in the morning? Please call and schedule.

## 2021-11-25 ENCOUNTER — Encounter: Payer: Self-pay | Admitting: Physician Assistant

## 2021-11-25 NOTE — Telephone Encounter (Signed)
Per [name], pt was offered appt for this morning along with her son and she declined.  She scheduled an appt for 12/02/2021.  Charyl Bigger, CMA

## 2021-11-25 NOTE — Progress Notes (Signed)
No growth of any bacteria in urine. Your symptoms are not from urinary/bladder/kidney infection. Come back in to be re-assessed.

## 2021-11-30 DIAGNOSIS — Z79899 Other long term (current) drug therapy: Secondary | ICD-10-CM | POA: Diagnosis not present

## 2021-11-30 DIAGNOSIS — R69 Illness, unspecified: Secondary | ICD-10-CM | POA: Diagnosis not present

## 2021-11-30 DIAGNOSIS — M5459 Other low back pain: Secondary | ICD-10-CM | POA: Diagnosis not present

## 2021-11-30 DIAGNOSIS — Z79891 Long term (current) use of opiate analgesic: Secondary | ICD-10-CM | POA: Diagnosis not present

## 2021-12-02 ENCOUNTER — Encounter: Payer: Self-pay | Admitting: Physician Assistant

## 2021-12-02 ENCOUNTER — Ambulatory Visit (INDEPENDENT_AMBULATORY_CARE_PROVIDER_SITE_OTHER): Payer: Medicare HMO | Admitting: Physician Assistant

## 2021-12-02 VITALS — BP 138/97 | HR 78 | Ht 66.0 in | Wt 156.0 lb

## 2021-12-02 DIAGNOSIS — R748 Abnormal levels of other serum enzymes: Secondary | ICD-10-CM

## 2021-12-02 DIAGNOSIS — N179 Acute kidney failure, unspecified: Secondary | ICD-10-CM | POA: Diagnosis not present

## 2021-12-02 DIAGNOSIS — E876 Hypokalemia: Secondary | ICD-10-CM | POA: Diagnosis not present

## 2021-12-02 DIAGNOSIS — Z87448 Personal history of other diseases of urinary system: Secondary | ICD-10-CM

## 2021-12-02 DIAGNOSIS — Z1159 Encounter for screening for other viral diseases: Secondary | ICD-10-CM | POA: Diagnosis not present

## 2021-12-02 NOTE — Progress Notes (Signed)
Established Patient Office Visit  Subjective   Patient ID: [name], female    DOB: 02/03/1966  Age: 57 y.o. MRN: [id number]  Chief Complaint  Patient presents with   Follow-up    HPI Pt is a 55 yo female who presents to the clinic to follow up on IC flare, dysuria, anorexia where she was seen on 11/20/2021. She was treated empirically for pyelonephritis due to her history but urine culture was negative for infection. Labs did show AKI, elevated liver enzymes and hypokalemia. Pt was not eating or drinking fluids at the time. She was also driving more alcohol than usual. She is feeling much better today. She has finished antibiotic. She is eating and drinking. She has no trouble urinating and pain is stable. Denies any fever, chills, body aches. She has cut back on alcohol per patient.   .. Active Ambulatory Problems    Diagnosis Date Noted   TOBACCO ABUSE 05/28/2009   MITRAL VALVE PROLAPSE 05/28/2009   UNSPECIFIED CARDIAC DYSRHYTHMIA 05/16/2009   TMJ PAIN 07/26/2008   GASTRIC ULCER 05/26/2009   NEPHROLITHIASIS 03/06/2010   HEMATURIA UNSPECIFIED 12/19/2008   OVARIAN CYST 03/06/2010   PALPITATIONS 05/16/2009   HEART MURMUR, HX OF 05/26/2009   Depressive disorder, not elsewhere classified 11/06/2010   Lymphocytic colitis 12/01/2011   EBV infection 04/21/2012   Chronic pain syndrome 06/21/2012   Fibromyalgia 06/21/2012   Carpal tunnel syndrome, bilateral 06/21/2012   Degenerative disc disease, cervical 06/21/2012   Facet arthropathy, cervical 06/21/2012   DDD (degenerative disc disease), lumbosacral 06/21/2012   Lower abdominal pain 07/26/2012   Interstitial cystitis (chronic) with hematuria 04/13/2013   Primary osteoarthritis of right knee 12/20/2013   Incontinence in female 06/26/2014   Acute bilateral lower abdominal pain 06/26/2014   Pain in joint, ankle and foot 04/21/2015   Status post implantation of urinary electronic stimulator device 05/05/2015    Post-menopausal 06/20/2015   Family history of cardiovascular disease 06/20/2015   Drug induced constipation 06/20/2015   No energy 06/20/2015   Dysuria 06/20/2015   Hiatal hernia 07/16/2015   Depression 09/03/2015   Primary osteoarthritis of left hand 02/27/2016   Breast pain, right 09/14/2016   Abnormal weight gain 03/16/2017   BMI 27.0-27.9,adult 03/16/2017   History of gastric ulcer 04/09/2019   RLS (restless legs syndrome) 04/09/2019   Mild episode of recurrent major depressive disorder ([place]) 03/17/2021   Stuttering 03/17/2021   Elevated LDL cholesterol level 03/17/2021   Word finding difficulty 03/17/2021   Sleep talking 03/17/2021   Abnormal MRI of head 05/19/2021   Chronic, continuous use of opioids 09/28/2013   Smoker 05/28/2009   Hyponatremia 06/10/2021   Anorexia 06/10/2021   Adult failure to thrive 06/10/2021   Acute lower UTI 06/10/2021   Elevated lipase 06/17/2021   Fatty liver 06/17/2021   Common bile duct dilatation 06/17/2021   Hypotension 11/20/2021   Malaise 11/20/2021   History of pyelonephritis 11/20/2021   Resolved Ambulatory Problems    Diagnosis Date Noted   Other change of lacrimal passages 03/01/2010   URI 08/17/2008   STONE, URINARY CALCULUS,UNSPEC. 12/29/2008   FOOT PAIN, LEFT 04/14/2010   Diarrhea 11/04/2009   CONTUSION, LEFT HAND 05/10/2009   CHEMICAL BURN 04/14/2010   Nephrolithiasis 87/56/4332   Bacterial folliculitis 95/18/8416   Acute pharyngitis 01/30/2011   BRONCHITIS, ACUTE 02/04/2011   ABDOMINAL PAIN, ACUTE 02/04/2011   Past Medical History:  Diagnosis Date   Anxiety    Cancer ([place])    Colitis  Dysrhythmia    Gastric ulcer    Headache(784.0)    Heart murmur    IBS (irritable bowel syndrome)    Interstitial cystitis    MVP (mitral valve prolapse)    Urinary calculus or stone       ROS See HPI.    Objective:     BP (!) 138/97   Pulse 78   Ht '5\' 6"'$  (1.676 m)   Wt 156 lb (70.8 kg)   SpO2 97%   BMI 25.18  kg/m  BP Readings from Last 3 Encounters:  12/02/21 (!) 138/97  11/20/21 (!) 88/60  08/17/21 126/84   Wt Readings from Last 3 Encounters:  12/02/21 156 lb (70.8 kg)  11/20/21 153 lb (69.4 kg)  08/17/21 154 lb (69.9 kg)      Physical Exam Constitutional:      Appearance: Normal appearance.  HENT:     Head: Normocephalic.  Cardiovascular:     Rate and Rhythm: Normal rate and regular rhythm.  Pulmonary:     Effort: Pulmonary effort is normal.  Musculoskeletal:     Right lower leg: No edema.     Left lower leg: No edema.  Neurological:     General: No focal deficit present.     Mental Status: She is alert and oriented to person, place, and time.  Psychiatric:        Mood and Affect: Mood normal.        Assessment & Plan:  [name] KitchenMarland KitchenArbutus was seen today for follow-up.  Diagnoses and all orders for this visit:  Elevated liver enzymes -     COMPLETE METABOLIC PANEL WITH GFR  AKI (acute kidney injury) ([place]) -     COMPLETE METABOLIC PANEL WITH GFR  Hypokalemia -     COMPLETE METABOLIC PANEL WITH GFR  History of pyelonephritis   Symptoms resolved.  Recheck CMP to follow up on lab abnormalities.  Continue with regular follow ups.    Iran Planas, PA-C

## 2021-12-03 NOTE — Progress Notes (Signed)
Kidney function MUCH better.  Potassium in normal range.  Liver enzymes are still elevated but have gone down a little.  Please add GGT and acute hepatitis panel to labs to further evaluate.

## 2021-12-07 LAB — ACUTE HEP PANEL AND HEP B SURFACE AB
HEPATITIS C ANTIBODY REFILL$(REFL): NONREACTIVE
Hep A IgM: NONREACTIVE
Hep B C IgM: NONREACTIVE
Hepatitis B Surface Ag: NONREACTIVE

## 2021-12-07 LAB — COMPLETE METABOLIC PANEL WITH GFR
AG Ratio: 1.4 (calc) (ref 1.0–2.5)
ALT: 86 U/L — ABNORMAL HIGH (ref 6–29)
AST: 131 U/L — ABNORMAL HIGH (ref 10–35)
Albumin: 4.1 g/dL (ref 3.6–5.1)
Alkaline phosphatase (APISO): 135 U/L (ref 37–153)
BUN: 12 mg/dL (ref 7–25)
CO2: 30 mmol/L (ref 20–32)
Calcium: 8.6 mg/dL (ref 8.6–10.4)
Chloride: 102 mmol/L (ref 98–110)
Creat: 0.74 mg/dL (ref 0.50–1.03)
Globulin: 2.9 g/dL (calc) (ref 1.9–3.7)
Glucose, Bld: 85 mg/dL (ref 65–99)
Potassium: 3.5 mmol/L (ref 3.5–5.3)
Sodium: 143 mmol/L (ref 135–146)
Total Bilirubin: 0.5 mg/dL (ref 0.2–1.2)
Total Protein: 7 g/dL (ref 6.1–8.1)
eGFR: 94 mL/min/{1.73_m2} (ref >=60)

## 2021-12-07 LAB — TEST AUTHORIZATION

## 2021-12-07 LAB — REFLEX TIQ

## 2021-12-07 LAB — GLUCOSE TOLERANCE TEST, 2 SPECIMENS (75G)

## 2021-12-17 ENCOUNTER — Other Ambulatory Visit: Payer: Self-pay | Admitting: Physician Assistant

## 2021-12-17 DIAGNOSIS — G2581 Restless legs syndrome: Secondary | ICD-10-CM

## 2021-12-21 DIAGNOSIS — N281 Cyst of kidney, acquired: Secondary | ICD-10-CM | POA: Diagnosis not present

## 2021-12-21 DIAGNOSIS — N200 Calculus of kidney: Secondary | ICD-10-CM | POA: Diagnosis not present

## 2022-01-04 DIAGNOSIS — Z79891 Long term (current) use of opiate analgesic: Secondary | ICD-10-CM | POA: Diagnosis not present

## 2022-01-04 DIAGNOSIS — R69 Illness, unspecified: Secondary | ICD-10-CM | POA: Diagnosis not present

## 2022-01-04 DIAGNOSIS — Z6824 Body mass index (BMI) 24.0-24.9, adult: Secondary | ICD-10-CM | POA: Diagnosis not present

## 2022-01-04 DIAGNOSIS — Z79899 Other long term (current) drug therapy: Secondary | ICD-10-CM | POA: Diagnosis not present

## 2022-01-04 DIAGNOSIS — R339 Retention of urine, unspecified: Secondary | ICD-10-CM | POA: Diagnosis not present

## 2022-01-04 DIAGNOSIS — M5459 Other low back pain: Secondary | ICD-10-CM | POA: Diagnosis not present

## 2022-01-06 DIAGNOSIS — Z79899 Other long term (current) drug therapy: Secondary | ICD-10-CM | POA: Diagnosis not present

## 2022-01-13 ENCOUNTER — Encounter: Payer: Self-pay | Admitting: Neurology

## 2022-02-01 ENCOUNTER — Encounter: Payer: Self-pay | Admitting: Physician Assistant

## 2022-02-02 ENCOUNTER — Encounter: Payer: Self-pay | Admitting: Physician Assistant

## 2022-02-02 DIAGNOSIS — Z6825 Body mass index (BMI) 25.0-25.9, adult: Secondary | ICD-10-CM | POA: Diagnosis not present

## 2022-02-02 DIAGNOSIS — I10 Essential (primary) hypertension: Secondary | ICD-10-CM | POA: Diagnosis not present

## 2022-02-02 DIAGNOSIS — Z79891 Long term (current) use of opiate analgesic: Secondary | ICD-10-CM | POA: Diagnosis not present

## 2022-02-02 DIAGNOSIS — Z79899 Other long term (current) drug therapy: Secondary | ICD-10-CM | POA: Diagnosis not present

## 2022-02-02 DIAGNOSIS — M5459 Other low back pain: Secondary | ICD-10-CM | POA: Diagnosis not present

## 2022-02-02 DIAGNOSIS — R339 Retention of urine, unspecified: Secondary | ICD-10-CM | POA: Diagnosis not present

## 2022-02-02 DIAGNOSIS — R69 Illness, unspecified: Secondary | ICD-10-CM | POA: Diagnosis not present

## 2022-02-02 NOTE — Telephone Encounter (Signed)
See additional MyChart message received 02/02/22.  Charyl Bigger, CMA

## 2022-02-03 ENCOUNTER — Other Ambulatory Visit: Payer: Self-pay | Admitting: Physician Assistant

## 2022-02-03 DIAGNOSIS — R110 Nausea: Secondary | ICD-10-CM

## 2022-02-05 DIAGNOSIS — Z79899 Other long term (current) drug therapy: Secondary | ICD-10-CM | POA: Diagnosis not present

## 2022-02-05 NOTE — Telephone Encounter (Signed)
Patient scheduled an appointment.

## 2022-02-09 ENCOUNTER — Encounter: Payer: Self-pay | Admitting: Physician Assistant

## 2022-02-10 ENCOUNTER — Ambulatory Visit: Payer: Medicare HMO | Admitting: Physician Assistant

## 2022-02-26 ENCOUNTER — Encounter: Payer: Self-pay | Admitting: Physician Assistant

## 2022-02-26 ENCOUNTER — Ambulatory Visit (INDEPENDENT_AMBULATORY_CARE_PROVIDER_SITE_OTHER): Payer: Medicare HMO | Admitting: Physician Assistant

## 2022-02-26 ENCOUNTER — Ambulatory Visit (INDEPENDENT_AMBULATORY_CARE_PROVIDER_SITE_OTHER): Payer: Medicare HMO

## 2022-02-26 VITALS — BP 149/96 | HR 69 | Wt 143.0 lb

## 2022-02-26 DIAGNOSIS — R300 Dysuria: Secondary | ICD-10-CM

## 2022-02-26 DIAGNOSIS — R8290 Unspecified abnormal findings in urine: Secondary | ICD-10-CM | POA: Insufficient documentation

## 2022-02-26 DIAGNOSIS — M79632 Pain in left forearm: Secondary | ICD-10-CM | POA: Insufficient documentation

## 2022-02-26 DIAGNOSIS — R69 Illness, unspecified: Secondary | ICD-10-CM | POA: Diagnosis not present

## 2022-02-26 DIAGNOSIS — W19XXXA Unspecified fall, initial encounter: Secondary | ICD-10-CM | POA: Diagnosis not present

## 2022-02-26 DIAGNOSIS — R296 Repeated falls: Secondary | ICD-10-CM | POA: Diagnosis not present

## 2022-02-26 DIAGNOSIS — R413 Other amnesia: Secondary | ICD-10-CM | POA: Diagnosis not present

## 2022-02-26 DIAGNOSIS — F1011 Alcohol abuse, in remission: Secondary | ICD-10-CM | POA: Insufficient documentation

## 2022-02-26 DIAGNOSIS — M25522 Pain in left elbow: Secondary | ICD-10-CM | POA: Diagnosis not present

## 2022-02-26 DIAGNOSIS — F10932 Alcohol use, unspecified with withdrawal with perceptual disturbance: Secondary | ICD-10-CM

## 2022-02-26 MED ORDER — CEFTRIAXONE SODIUM 1 G IJ SOLR
1.0000 g | Freq: Once | INTRAMUSCULAR | Status: AC
  Administered 2022-02-26: 1 g via INTRAMUSCULAR

## 2022-02-26 NOTE — Progress Notes (Signed)
There is some swelling and fluid accumulation in left elbow. No definite fracture but since it has been so long suspect there was one. Do you have an elbow sling? I don't think a brace would be very comfortable but a sling could help protect forearm. Ice area. I do think I could give some pain medication depending on how much pain you are in?

## 2022-02-26 NOTE — Progress Notes (Unsigned)
Established Patient Office Visit  Subjective   Patient ID: [name], female    DOB: 10/27/1965  Age: 56 y.o. MRN: [id number]  Chief Complaint  Patient presents with   [name] multiple times and memory lost    HPI Pt is a 56 yo female who presents to the clinic after experiencing a week of hallucinations, shaking, sweating, mood changes attributed to alcohol withdrawals. 1 week ago her husband fell down the steps and punctured his lungs. She was not able to drive him to the hospital because she was drunk. She realized they were both drinking too much so she cold [place] stopped. She was drinking a fifth of liquor daily. Her husband stayed in hospital. She was at home alone and would see her husband and her kids but they were not there. She was paranoid and thought she was seeing ghost. Her husband has been home for 2 days and not had any hallucinations since. Her shaking and sweating is better as well.   In one of her falls about 2 weeks ago she landed on her left lateral elbow and forearm. It has remained tender since to touch. She can move the elbow fine. She just wants to make sure she did not fracture it.   Pt has IC and hx of UTI. Her urine does have some odor. No fever, chills, nausea, vomiting. Concerned for UTI.    .. Active Ambulatory Problems    Diagnosis Date Noted   TOBACCO ABUSE 05/28/2009   MITRAL VALVE PROLAPSE 05/28/2009   UNSPECIFIED CARDIAC DYSRHYTHMIA 05/16/2009   TMJ PAIN 07/26/2008   GASTRIC ULCER 05/26/2009   NEPHROLITHIASIS 03/06/2010   HEMATURIA UNSPECIFIED 12/19/2008   OVARIAN CYST 03/06/2010   PALPITATIONS 05/16/2009   HEART MURMUR, HX OF 05/26/2009   Depressive disorder, not elsewhere classified 11/06/2010   Lymphocytic colitis 12/01/2011   EBV infection 04/21/2012   Chronic pain syndrome 06/21/2012   Fibromyalgia 06/21/2012   Carpal tunnel syndrome, bilateral 06/21/2012   Degenerative disc disease, cervical 06/21/2012   Facet  arthropathy, cervical 06/21/2012   DDD (degenerative disc disease), lumbosacral 06/21/2012   Lower abdominal pain 07/26/2012   Interstitial cystitis (chronic) with hematuria 04/13/2013   Primary osteoarthritis of right knee 12/20/2013   Incontinence in female 06/26/2014   Acute bilateral lower abdominal pain 06/26/2014   Pain in joint, ankle and foot 04/21/2015   Status post implantation of urinary electronic stimulator device 05/05/2015   Post-menopausal 06/20/2015   Family history of cardiovascular disease 06/20/2015   Drug induced constipation 06/20/2015   No energy 06/20/2015   Dysuria 06/20/2015   Hiatal hernia 07/16/2015   Depression 09/03/2015   Primary osteoarthritis of left hand 02/27/2016   Breast pain, right 09/14/2016   Abnormal weight gain 03/16/2017   BMI 27.0-27.9,adult 03/16/2017   History of gastric ulcer 04/09/2019   RLS (restless legs syndrome) 04/09/2019   Mild episode of recurrent major depressive disorder ([place]) 03/17/2021   Stuttering 03/17/2021   Elevated LDL cholesterol level 03/17/2021   Word finding difficulty 03/17/2021   Sleep talking 03/17/2021   Abnormal MRI of head 05/19/2021   Chronic, continuous use of opioids 09/28/2013   Smoker 05/28/2009   Hyponatremia 06/10/2021   Anorexia 06/10/2021   Adult failure to thrive 06/10/2021   Acute lower UTI 06/10/2021   Elevated lipase 06/17/2021   Fatty liver 06/17/2021   Common bile duct dilatation 06/17/2021   Hypotension 11/20/2021   Malaise 11/20/2021   History of pyelonephritis 11/20/2021  Alcohol abuse, in remission 02/26/2022   Left forearm pain 02/26/2022   Alcohol withdrawal hallucinosis ([place]) 02/26/2022   Abnormal urine odor 02/26/2022   Frequent falls 02/26/2022   Memory changes 02/26/2022   Resolved Ambulatory Problems    Diagnosis Date Noted   Other change of lacrimal passages 03/01/2010   URI 08/17/2008   STONE, URINARY CALCULUS,UNSPEC. 12/29/2008   FOOT PAIN, LEFT 04/14/2010    Diarrhea 11/04/2009   CONTUSION, LEFT HAND 05/10/2009   CHEMICAL BURN 04/14/2010   Nephrolithiasis 63/14/9702   Bacterial folliculitis 63/78/5885   Acute pharyngitis 01/30/2011   BRONCHITIS, ACUTE 02/04/2011   ABDOMINAL PAIN, ACUTE 02/04/2011   Past Medical History:  Diagnosis Date   Anxiety    Cancer ([place])    Colitis    Dysrhythmia    Gastric ulcer    Headache(784.0)    Heart murmur    IBS (irritable bowel syndrome)    Interstitial cystitis    MVP (mitral valve prolapse)    Urinary calculus or stone     ROS See HPI.    Objective:     BP (!) 149/96   Pulse 69   Wt 143 lb (64.9 kg)   SpO2 100%   BMI 23.08 kg/m  BP Readings from Last 3 Encounters:  02/26/22 (!) 149/96  12/02/21 (!) 138/97  11/20/21 (!) 88/60   Wt Readings from Last 3 Encounters:  02/26/22 143 lb (64.9 kg)  12/02/21 156 lb (70.8 kg)  11/20/21 153 lb (69.4 kg)    .[name]    02/26/2022   11:12 AM  [facility]real Cognitive Assessment   Visuospatial/ Executive (0/5) 3  Naming (0/3) 2  Attention: Read list of digits (0/2) 2  Attention: Read list of letters (0/1) 1  Attention: Serial 7 subtraction starting at 100 (0/3) 3  Language: Repeat phrase (0/2) 0  Language : Fluency (0/1) 0  Abstraction (0/2) 2  Delayed Recall (0/5) 4  Orientation (0/6) 5  Total 22  Adjusted Score (based on education) 23     Physical Exam Constitutional:      Appearance: Normal appearance.  HENT:     Head: Normocephalic.  Cardiovascular:     Rate and Rhythm: Normal rate and regular rhythm.     Pulses: Normal pulses.  Pulmonary:     Effort: Pulmonary effort is normal.  Musculoskeletal:     Right lower leg: No edema.     Left lower leg: No edema.     Comments: Very tender to palpation over left proximal forearm to the elbow.  ROM of left elbow 5/5.  Strength 5/5.  Mild swelling noted.   Neurological:     General: No focal deficit present.     Mental Status: She is alert and oriented to person, place, and time.   Psychiatric:        Mood and Affect: Mood normal.       Assessment & Plan:  [name] KitchenMarland KitchenRhylei was seen today for fall.  Diagnoses and all orders for this visit:  Alcohol withdrawal hallucinosis ([place]) -     B12 and Folate Panel -     Vitamin B1 -     COMPLETE METABOLIC PANEL WITH GFR -     CBC w/Diff/Platelet -     TSH  Memory changes -     Urine Culture -     B12 and Folate Panel -     Vitamin B1 -     COMPLETE METABOLIC PANEL WITH GFR -  CBC w/Diff/Platelet -     TSH  Frequent falls  Dysuria -     Urine Culture -     B12 and Folate Panel -     Vitamin B1 -     COMPLETE METABOLIC PANEL WITH GFR -     CBC w/Diff/Platelet -     TSH -     cefTRIAXone (ROCEPHIN) injection 1 g  Abnormal urine odor -     Urine Culture -     cefTRIAXone (ROCEPHIN) injection 1 g  Left forearm pain -     DG Forearm Left; Future -     DG ELBOW COMPLETE LEFT (3+VIEW); Future  Alcohol abuse, in remission   Suspect hallucinations are due to alcohol withdrawal as well as memory changes MOCA 23 with slight decline will continue to monitor Alcohol like due to the recent increase in falls Will get xray of left forerm, discussed rest, ice and diclofenac gel Discussed NO alcohol.  Could not get urine but concerned for infection 1g rocephin given IM Given cup to collect urine and return Monday.  Get labs to look for any metobolic issues Follow up in 2 weeks    Iran Planas, PA-C

## 2022-02-26 NOTE — Progress Notes (Signed)
Fell 2 weeks ago and still having pinpoint pain over ulnar insertion at elbow that is very tender. Good ROM of left forearm and strength. What do you suggest?

## 2022-02-26 NOTE — Patient Instructions (Signed)
Get labs.   Alcohol Withdrawal Syndrome Alcohol withdrawal syndrome is a group of symptoms that can develop when a person who drinks heavily and regularly stops drinking or drinks less. Alcohol withdrawal syndrome can be mild or severe, and it may even be life-threatening. Alcohol withdrawal syndrome usually affects people who have alcohol use disorder, which may also be called alcoholism. Alcohol use disorder is when a person is unable to control their alcohol use. This usually disrupts daily life. What are the causes? Drinking heavily on a regular basis cause changes in brain chemistry. Over time, the body becomes dependent on alcohol. When alcohol use stops, the chemical system in the brain becomes unbalanced and causes the symptoms of alcohol withdrawal. What increases the risk? Alcohol withdrawal syndrome is more likely to occur in people who drink more than the recommended limit of alcohol. The recommended limit is 2 drinks a day for men and 1 drink a day for women who are not pregnant. It is also more likely to affect heavy drinkers who have been using alcohol for long periods of time. The more a person drinks and the longer they drink, the greater the risk of alcohol withdrawal syndrome. The following factors may make a person more likely to develop alcohol withdrawal syndrome: Having had severe alcohol withdrawal in the past. Having had a seizure during a previous episode of alcohol withdrawal. Being elderly. Using other drugs. Having a long-term (chronic) medical problem, such as heart, lung, or liver disease. Having depression. Not getting enough nutrients from their diet (malnutrition). What are the signs or symptoms? Symptoms of this condition can be mild, moderate, or severe. Symptoms may start within 6 hours after the last drink. 48 hours after the last drink, a person may have the following symptoms: Uncontrollable shaking (tremor). Sweating. Inability to relax  (agitation). Trouble sleeping (insomnia). Irregular heartbeats (palpitations). Alcohol cravings. Seizure. The following symptoms may get worse 24-48 hours after a person decreases or stops alcohol use, then gradually improve over a period of days or weeks: Nausea and vomiting. Feeling tired (fatigue). Sensitivity to light and sounds. Confusion and inability to think clearly. Loss of appetite. Mood swings, irritability, depression, and anxiety. Insomnia and nightmares. The following symptoms are severe and life-threatening. When these symptoms occur together, they are called delirium tremens (DTs): High blood pressure. Increased heart rate. Trouble breathing. Seizures. Seeing, hearing, feeling, smelling, or tasting things that are not there (hallucinations). Delirium tremens requires immediate hospitalization. How is this diagnosed? This condition may be diagnosed based on: Your symptoms and medical history. Your history of alcohol use. Your health care provider may ask questions about your drinking behavior. It is important to be honest when you answer these questions. A psychological assessment. A physical exam. Blood tests or urine tests to measure blood alcohol level and to rule out other causes of symptoms. MRI or CT scan. This may be done if you seem to have abnormal thinking or behaviors (altered mental status). Diagnosis can be difficult. People going through withdrawal often avoid seeking medical care and are not thinking clearly. Friends and family members play an important role in recognizing symptoms and encouraging loved ones to get treatment. How is this treated? Most people with symptoms of withdrawal can be treated outside of a hospital (outpatient treatment), with close monitoring such as daily check-ins with a health care provider and counseling. You may need treatment at a hospital or treatment center (inpatient treatment) if: You have a history of delirium tremens or  seizures.  You have severe symptoms. You are addicted to other drugs. You cannot swallow medicine. You have a serious medical condition such as heart failure. You experienced withdrawal in the past but then you continued drinking alcohol. You are not likely to commit to an outpatient treatment plan. Treatment may involve: Monitoring your blood pressure, pulse, and breathing. IV fluids to keep you hydrated. Medicines to reduce withdrawal symptoms and discomfort. Medicine to reduce anxiety. Medicine to prevent or control seizures. Multivitamins and B vitamins. Having a health care provider check on you daily. It is important to get treatment for alcohol withdrawal early. Getting treatment early can: Speed up your recovery from withdrawal symptoms. Make you more likely to stop drinking for a long time (sobriety). If you need help to stop drinking, your health care provider may recommend a long-term treatment plan that includes: Medicines to help treat alcohol use disorder. Substance abuse counseling. Support groups. Follow these instructions at home:  Take over-the-counter and prescription medicines only as told by your health care provider. Do not drink alcohol. Do not drive until your health care provider approves. Have someone you trust stay with you or be available if you need help with your symptoms or with not drinking. Drink enough fluid to keep your urine pale yellow. Consider joining an alcohol support group or treatment program. This can provide emotional support, advice, and guidance. Contact a health care provider if: Your symptoms get worse instead of better. You cannot eat or drink without vomiting. You cannot stop drinking alcohol. Get help right away if: You have an irregular heartbeat. You have chest pain. You have trouble breathing. You are told you had a seizure. You hallucinate. You become very confused. These symptoms may be an emergency. Get help right away.  Call 911. Do not wait to see if the symptoms will go away. Do not drive yourself to the hospital. Summary Alcohol withdrawal is a group of symptoms that can develop when a person who drinks heavily and regularly stops drinking or drinks less. Symptoms of this condition can be mild, moderate, or severe. Treatment may include hospitalization, medicine, and counseling. This information is not intended to replace advice given to you by your health care provider. Make sure you discuss any questions you have with your health care provider. Document Revised: 08/12/2021 Document Reviewed: 08/12/2021 Elsevier Patient Education  [place].

## 2022-03-01 ENCOUNTER — Encounter: Payer: Self-pay | Admitting: Physician Assistant

## 2022-03-03 DIAGNOSIS — R69 Illness, unspecified: Secondary | ICD-10-CM | POA: Diagnosis not present

## 2022-03-03 DIAGNOSIS — Z79891 Long term (current) use of opiate analgesic: Secondary | ICD-10-CM | POA: Diagnosis not present

## 2022-03-03 DIAGNOSIS — Z79899 Other long term (current) drug therapy: Secondary | ICD-10-CM | POA: Diagnosis not present

## 2022-03-03 DIAGNOSIS — M5459 Other low back pain: Secondary | ICD-10-CM | POA: Diagnosis not present

## 2022-03-03 DIAGNOSIS — Z6826 Body mass index (BMI) 26.0-26.9, adult: Secondary | ICD-10-CM | POA: Diagnosis not present

## 2022-03-24 ENCOUNTER — Ambulatory Visit (INDEPENDENT_AMBULATORY_CARE_PROVIDER_SITE_OTHER): Payer: Medicare HMO | Admitting: Family Medicine

## 2022-03-24 DIAGNOSIS — Z0000 Encounter for general adult medical examination without abnormal findings: Secondary | ICD-10-CM

## 2022-03-24 NOTE — Progress Notes (Signed)
MEDICARE ANNUAL WELLNESS VISIT  03/24/2022  Telephone Visit Disclaimer This Medicare AWV was conducted by telephone due to national recommendations for restrictions regarding the COVID-19 Pandemic (e.g. social distancing).  I verified, using two identifiers, that I am speaking with [name] or their authorized healthcare agent. I discussed the limitations, risks, security, and privacy concerns of performing an evaluation and management service by telephone and the potential availability of an in-person appointment in the future. The patient expressed understanding and agreed to proceed.  Location of Patient: Home Location of Provider (nurse):  In the office.  Subjective:    [name] is a 56 y.o. female patient of [name], Shelly Car, PA-C who had a [employer] Visit today via telephone. [name] is Disabled and lives alone. she has 2 children. she reports that she is socially active and does interact with friends/family regularly. she is minimally physically active and enjoys collected gemstones and selling them.  Patient Care Team: [name] as PCP - General (Family Medicine)     03/24/2022   10:54 AM 06/10/2021   11:00 AM 06/09/2021    7:55 PM 03/16/2021    1:15 PM 11/22/2016    8:51 AM 12/11/2012    7:25 AM 08/01/2012    1:20 PM  Advanced Directives  Does Patient Have a Medical Advance Directive? Yes Yes Yes Yes Yes Patient does not have advance directive;Patient would not like information Patient does not have advance directive  Type of Advance Directive Living will Living will;Healthcare Power of Attorney Living will;Healthcare Power of Attorney Living will;Healthcare Power of [place]    Does patient want to make changes to medical advance directive? No - Patient declined No - Patient declined  No - Patient declined     Copy of [place] in Chart?  No - copy requested  No - copy requested Yes     Pre-existing out of facility DNR order (yellow form or pink MOST form)      No     Hospital Utilization Over the Past 12 Months: # of hospitalizations or ER visits: 0 # of surgeries: 0  Review of Systems    Patient reports that her overall health is unchanged compared to last year.  History obtained from chart review and the patient  Patient Reported Readings (BP, Pulse, CBG, Weight, etc) none  Pain Assessment Pain : No/denies pain     Current Medications & Allergies (verified) Allergies as of 03/24/2022       Reactions   Gabapentin Anaphylaxis   Sleepiness Sleepiness   Lyrica [pregabalin] Anaphylaxis   Made symptoms worse.  Made symptoms worse.    Ambien [zolpidem] Other (See Comments)   Insomnia Pt did not state her reaction    Amitriptyline Other (See Comments)   Pt did not state her reaction   Bactrim [sulfamethoxazole-trimethoprim] Nausea Only   GI upset        Medication List        Accurate as of March 24, 2022 10:56 AM. If you have any questions, ask your nurse or doctor.          albuterol (2.5 MG/3ML) 0.083% nebulizer solution Commonly known as: PROVENTIL Take 3 mLs (2.5 mg total) by nebulization every 4 (four) hours as needed for wheezing or shortness of breath (please include nebulizer machine, hoses, and mask if needed.).   atenolol 50 MG tablet Commonly known as: TENORMIN TAKE ONE-HALF TABLET BY MOUTH  ONCE DAILY What changed: how much to take   clobetasol ointment 0.05 % Commonly known as: TEMOVATE Apply topically.   diphenhydramine-acetaminophen 25-500 MG Tabs tablet Commonly known as: TYLENOL PM Take 2 tablets by mouth at bedtime as needed (sleep).   DULoxetine 30 MG capsule Commonly known as: Cymbalta Take 3 capsules (90 mg total) by mouth daily.   hydrOXYzine 25 MG tablet Commonly known as: ATARAX Take 25 mg by mouth 3 (three) times daily as needed for anxiety.   lidocaine 2 % injection Commonly known as: XYLOCAINE 10  mLs by Other route See admin instructions. Once to twice daily as needed for pain   linaclotide 290 MCG Caps capsule Commonly known as: Linzess Take 1 capsule (290 mcg total) by mouth daily before breakfast. What changed:  when to take this reasons to take this   morphine 60 MG 12 hr tablet Commonly known as: MS CONTIN Take 60 mg by mouth every 12 (twelve) hours.   nitrofurantoin 50 MG capsule Commonly known as: MACRODANTIN Take 50 mg by mouth daily.   pantoprazole 40 MG tablet Commonly known as: PROTONIX Take 1 tablet (40 mg total) by mouth daily.   pramipexole 0.125 MG tablet Commonly known as: MIRAPEX TAKE ONE TABLET BY MOUTH AT BEDTIME AS NEEDED FOR RESTLESS LEGS   promethazine 25 MG tablet Commonly known as: PHENERGAN TAKE 1 TABLET DAILY AS     NEEDED FOR NAUSEA   Sensorcaine 0.5 % injection Generic drug: bupivacaine 15 mLs by Infiltration route once.        History (reviewed): Past Medical History:  Diagnosis Date   Anxiety    Cancer ([place])    cervical   Colitis    lymphacitic   Depression    Dysrhythmia    pvc    Gastric ulcer    Headache(784.0)    Heart murmur    IBS (irritable bowel syndrome)    Interstitial cystitis    MVP (mitral valve prolapse)    Urinary calculus or stone    Past Surgical History:  Procedure Laterality Date   APPENDECTOMY  06/15/2003   BILATERAL OOPHORECTOMY  06/14/2010   DILATION AND CURETTAGE OF UTERUS     x 4  one after each SAB   EXPLORATORY LAPAROTOMY  06/14/2005   LAPAROSCOPY N/A 08/04/2012   Procedure: LAPAROSCOPY DIAGNOSTIC;  Surgeon: Gayland Curry, MD,FACS;  Location: [facility];  Service: General;  Laterality: N/A;   TONSILLECTOMY  30 years ago   TOTAL ABDOMINAL HYSTERECTOMY  06/15/2003   TUBAL LIGATION  06/14/1998   Family History  Problem Relation Age of Onset   Heart attack Mother    Cirrhosis Mother    Cancer Father        tongue   Cirrhosis Father    Cancer Paternal Uncle    Colon cancer Neg Hx     Esophageal cancer Neg Hx    Rectal cancer Neg Hx    Stomach cancer Neg Hx    Breast cancer Neg Hx    Social History   Socioeconomic History   Marital status: Married    Spouse name: Legrand Como   Number of children: 2   Years of education: 12   Highest education level: 12th grade  Occupational History   Occupation: Legally disabled  Tobacco Use   Smoking status: Every Day    Packs/day: 1.00    Years: 25.00    Total pack years: 25.00    Types: Cigarettes   Smokeless tobacco: Never  Tobacco comments:    03/24/22 Started smoking 0.5 pack per day since October, 2022.  Vaping Use   Vaping Use: Never used  Substance and Sexual Activity   Alcohol use: Yes    Comment: rarely   Drug use: No   Sexual activity: Yes    Partners: Male  Other Topics Concern   Not on file  Social History Narrative   Lives with her husband. She has two children. She enjoys collecting gemstones and selling them.   Social Determinants of Health   Financial Resource Strain: Low Risk  (03/24/2022)   Overall Financial Resource Strain (CARDIA)    Difficulty of Paying Living Expenses: Not hard at all  Food Insecurity: No Food Insecurity (03/24/2022)   Hunger Vital Sign    Worried About Running Out of Food in the Last Year: Never true    Ran Out of Food in the Last Year: Never true  Transportation Needs: No Transportation Needs (03/24/2022)   PRAPARE - Hydrologist (Medical): No    Lack of Transportation (Non-Medical): No  Physical Activity: Inactive (03/24/2022)   Exercise Vital Sign    Days of Exercise per Week: 0 days    Minutes of Exercise per Session: 0 min  Stress: No Stress Concern Present (03/24/2022)   [place]    Feeling of Stress : Not at all  Social Connections: Moderately Isolated (03/24/2022)   Social Connection and Isolation Panel [NHANES]    Frequency of Communication with Friends and  Family: More than three times a week    Frequency of Social Gatherings with Friends and Family: Never    Attends Religious Services: Never    Marine scientist or Organizations: No    Attends Archivist Meetings: Never    Marital Status: Married    Activities of Daily Living    03/24/2022   10:52 AM 06/10/2021    1:27 AM  In your present state of health, do you have any difficulty performing the following activities:  Hearing? 0 0  Vision? 0 0  Difficulty concentrating or making decisions? 1 0  Comment some memory loss   Walking or climbing stairs? 1 1  Comment does not climb stairs. difficulty climbing stairs  Dressing or bathing? 0 0  Doing errands, shopping? 0 0  Preparing Food and eating ? N   Using the Toilet? N   In the past six months, have you accidently leaked urine? N   Do you have problems with loss of bowel control? N   Managing your Medications? N   Managing your Finances? N   Housekeeping or managing your Housekeeping? N     Patient Education/ Literacy How often do you need to have someone help you when you read instructions, pamphlets, or other written materials from your doctor or pharmacy?: 1 - Never What is the last grade level you completed in school?: 12th grade  Exercise Current Exercise Habits: The patient does not participate in regular exercise at present, Exercise limited by: Other - see comments (bladder disease.)  Diet Patient reports consuming 3 meals a day and 1 snack(s) a day Patient reports that her primary diet is: Regular Patient reports that she does have regular access to food.   Depression Screen    03/24/2022   10:47 AM 08/17/2021   10:42 AM 03/16/2021    1:16 PM 08/29/2020   11:14 AM 04/02/2019   10:52 AM 04/22/2018  12:12 AM 12/12/2017    1:17 PM  PHQ 2/9 Scores  PHQ - 2 Score '1 3 2 4 4 1 4  '$ PHQ- 9 Score  '8 7 13 10 3 8     '$ Fall Risk    03/24/2022   10:45 AM 02/26/2022   11:01 AM 08/17/2021   10:42 AM 03/16/2021     1:16 PM 08/29/2020   11:14 AM  Fall Risk   Falls in the past year? 0 1 0 0 0  Number falls in past yr: 0 1 0 0 0  Injury with Fall? 0 1 0 0 0  Risk for fall due to : No Fall Risks History of fall(s) No Fall Risks No Fall Risks   Follow up Falls evaluation completed Falls evaluation completed Falls evaluation completed Falls evaluation completed;Education provided;Falls prevention discussed Falls evaluation completed     Objective:  [name] seemed alert and oriented and she participated appropriately during our telephone visit.  Blood Pressure Weight BMI  BP Readings from Last 3 Encounters:  02/26/22 (!) 149/96  12/02/21 (!) 138/97  11/20/21 (!) 88/60   Wt Readings from Last 3 Encounters:  02/26/22 143 lb (64.9 kg)  12/02/21 156 lb (70.8 kg)  11/20/21 153 lb (69.4 kg)   BMI Readings from Last 1 Encounters:  02/26/22 23.08 kg/m    *Unable to obtain current vital signs, weight, and BMI due to telephone visit type  Hearing/Vision  [name] did not seem to have difficulty with hearing/understanding during the telephone conversation Reports that she has not had a formal eye exam by an eye care professional within the past year Reports that she has not had a formal hearing evaluation within the past year *Unable to fully assess hearing and vision during telephone visit type  Cognitive Function:    03/24/2022   10:54 AM 03/16/2021    1:27 PM  6CIT Screen  What Year? 0 points 0 points  What month? 0 points 0 points  What time? 0 points 0 points  Count back from 20 0 points 0 points  Months in reverse 0 points 0 points  Repeat phrase 0 points 0 points  Total Score 0 points 0 points   (Normal:0-7, Significant for Dysfunction: >8)  Normal Cognitive Function Screening: Yes   Immunization & Health Maintenance Record Immunization History  Administered Date(s) Administered   Influenza,inj,Quad PF,6+ Mos 02/24/2015, 03/22/2016, 03/16/2017, 04/18/2018, 03/17/2021    Influenza,inj,quad, With Preservative 03/22/2016   Influenza-Unspecified 01/27/2022   PPD Test 05/29/2012   Td 05/16/2009   Tdap 09/08/2011    Health Maintenance  Topic Date Due   TETANUS/TDAP  12/03/2022 (Originally 09/07/2021)   MAMMOGRAM  09/11/2023   COLONOSCOPY (Pts 45-67yr Insurance coverage will need to be confirmed)  04/02/2025   INFLUENZA VACCINE  Completed   Hepatitis C Screening  Completed   HIV Screening  Completed   HPV VACCINES  Aged Out   COVID-19 Vaccine  Discontinued   Zoster Vaccines- Shingrix  Discontinued       Assessment  This is a routine wellness examination for [employer]  Health Maintenance: Due or Overdue There are no preventive care reminders to display for this patient.  [name] does not need a referral for CCommercial Metals CompanyAssistance: Care Management:   no Social Work:    no Prescription Assistance:  no Nutrition/Diabetes Education:  no   Plan:  Personalized Goals  Goals Addressed  This Visit's Progress     Patient Stated (pt-stated)        03/24/2022 AWV Goal: Exercise for General Health  Patient will verbalize understanding of the benefits of increased physical activity: Exercising regularly is important. It will improve your overall fitness, flexibility, and endurance. Regular exercise also will improve your overall health. It can help you control your weight, reduce stress, and improve your bone density. Over the next year, patient will increase physical activity as tolerated with a goal of at least 150 minutes of moderate physical activity per week.  You can tell that you are exercising at a moderate intensity if your heart starts beating faster and you start breathing faster but can still hold a conversation. Moderate-intensity exercise ideas include: Walking 1 mile (1.6 km) in about 15 minutes Biking Hiking Golfing Dancing Water aerobics Patient will verbalize understanding of everyday activities that  increase physical activity by providing examples like the following: Yard work, such as: Sales promotion account executive Gardening Washing windows or floors Patient will be able to explain general safety guidelines for exercising:  Before you start a new exercise program, talk with your health care provider. Do not exercise so much that you hurt yourself, feel dizzy, or get very short of breath. Wear comfortable clothes and wear shoes with good support. Drink plenty of water while you exercise to prevent dehydration or heat stroke. Work out until your breathing and your heartbeat get faster.        Personalized Health Maintenance & Screening Recommendations  Td vaccine  Lung Cancer Screening Recommended: yes; declined at this time. (Low Dose CT Chest recommended if Age 77-80 years, 20 pack-year currently smoking OR have quit w/in past 15 years) Hepatitis C Screening recommended: no HIV Screening recommended: no  Advanced Directives: Written information was not prepared per patient's request.  Referrals & Orders No orders of the defined types were placed in this encounter.   Follow-up Plan Follow-up with Shelly Stade, PA-C as planned Please schedule TD vaccine at the pharmacy.  Medicare wellness visit in one year. Patient will access AVS on my chart.   I have personally reviewed and noted the following in the patient's chart:   Medical and social history Use of alcohol, tobacco or illicit drugs  Current medications and supplements Functional ability and status Nutritional status Physical activity Advanced directives List of other physicians Hospitalizations, surgeries, and ER visits in previous 12 months Vitals Screenings to include cognitive, depression, and falls Referrals and appointments  In addition, I have reviewed and discussed with [name] certain preventive protocols, quality  metrics, and best practice recommendations. A written personalized care plan for preventive services as well as general preventive health recommendations is available and can be mailed to the patient at her request.      Tinnie Gens, RN BSN  03/24/2022

## 2022-03-24 NOTE — Patient Instructions (Signed)
[place] Maintenance Summary and Written Plan of Care  Ms. [name] ,  Thank you for allowing me to perform your Medicare Annual Wellness Visit and for your ongoing commitment to your health.   Health Maintenance & Immunization History Health Maintenance  Topic Date Due   TETANUS/TDAP  12/03/2022 (Originally 09/07/2021)   MAMMOGRAM  09/11/2023   COLONOSCOPY (Pts 45-42yr Insurance coverage will need to be confirmed)  04/02/2025   INFLUENZA VACCINE  Completed   Hepatitis C Screening  Completed   HIV Screening  Completed   HPV VACCINES  Aged Out   COVID-19 Vaccine  Discontinued   Zoster Vaccines- Shingrix  Discontinued   Immunization History  Administered Date(s) Administered   Influenza,inj,Quad PF,6+ Mos 02/24/2015, 03/22/2016, 03/16/2017, 04/18/2018, 03/17/2021   Influenza,inj,quad, With Preservative 03/22/2016   Influenza-Unspecified 01/27/2022   PPD Test 05/29/2012   Td 05/16/2009   Tdap 09/08/2011    These are the patient goals that we discussed:  Goals Addressed               This Visit's Progress     Patient Stated (pt-stated)        03/24/2022 AWV Goal: Exercise for General Health  Patient will verbalize understanding of the benefits of increased physical activity: Exercising regularly is important. It will improve your overall fitness, flexibility, and endurance. Regular exercise also will improve your overall health. It can help you control your weight, reduce stress, and improve your bone density. Over the next year, patient will increase physical activity as tolerated with a goal of at least 150 minutes of moderate physical activity per week.  You can tell that you are exercising at a moderate intensity if your heart starts beating faster and you start breathing faster but can still hold a conversation. Moderate-intensity exercise ideas include: Walking 1 mile (1.6 km) in about 15 minutes Biking Hiking Golfing Dancing Water  aerobics Patient will verbalize understanding of everyday activities that increase physical activity by providing examples like the following: Yard work, such as: PSales promotion account executiveGardening Washing windows or floors Patient will be able to explain general safety guidelines for exercising:  Before you start a new exercise program, talk with your health care provider. Do not exercise so much that you hurt yourself, feel dizzy, or get very short of breath. Wear comfortable clothes and wear shoes with good support. Drink plenty of water while you exercise to prevent dehydration or heat stroke. Work out until your breathing and your heartbeat get faster.          This is a list of Health Maintenance Items that are overdue or due now: Td vaccine  Orders/Referrals Placed Today: No orders of the defined types were placed in this encounter.  (Contact our referral department at [phone] you have not spoken with someone about your referral appointment within the next 5 days)    Follow-up Plan Follow-up with BDonella Stade PA-C as planned Please schedule TD vaccine at the pharmacy.  Medicare wellness visit in one year. Patient will access AVS on my chart.      Health Maintenance, Female Adopting a healthy lifestyle and getting preventive care are important in promoting health and wellness. Ask your health care provider about: The right schedule for you to have regular tests and exams. Things you can do on your own to prevent diseases and keep yourself healthy. What should I know  about diet, weight, and exercise? Eat a healthy diet  Eat a diet that includes plenty of vegetables, fruits, low-fat dairy products, and lean protein. Do not eat a lot of foods that are high in solid fats, added sugars, or sodium. Maintain a healthy weight Body mass index (BMI) is used to identify weight problems.  It estimates body fat based on height and weight. Your health care provider can help determine your BMI and help you achieve or maintain a healthy weight. Get regular exercise Get regular exercise. This is one of the most important things you can do for your health. Most adults should: Exercise for at least 150 minutes each week. The exercise should increase your heart rate and make you sweat (moderate-intensity exercise). Do strengthening exercises at least twice a week. This is in addition to the moderate-intensity exercise. Spend less time sitting. Even light physical activity can be beneficial. Watch cholesterol and blood lipids Have your blood tested for lipids and cholesterol at 57 years of age, then have this test every 5 years. Have your cholesterol levels checked more often if: Your lipid or cholesterol levels are high. You are older than 57 years of age. You are at high risk for heart disease. What should I know about cancer screening? Depending on your health history and family history, you may need to have cancer screening at various ages. This may include screening for: Breast cancer. Cervical cancer. Colorectal cancer. Skin cancer. Lung cancer. What should I know about heart disease, diabetes, and high blood pressure? Blood pressure and heart disease High blood pressure causes heart disease and increases the risk of stroke. This is more likely to develop in people who have high blood pressure readings or are overweight. Have your blood pressure checked: Every 3-5 years if you are 5-16 years of age. Every year if you are 55 years old or older. Diabetes Have regular diabetes screenings. This checks your fasting blood sugar level. Have the screening done: Once every three years after age 59 if you are at a normal weight and have a low risk for diabetes. More often and at a younger age if you are overweight or have a high risk for diabetes. What should I know about preventing  infection? Hepatitis B If you have a higher risk for hepatitis B, you should be screened for this virus. Talk with your health care provider to find out if you are at risk for hepatitis B infection. Hepatitis C Testing is recommended for: Everyone born from [birth year] through 1966. Anyone with known risk factors for hepatitis C. Sexually transmitted infections (STIs) Get screened for STIs, including gonorrhea and chlamydia, if: You are sexually active and are younger than 57 years of age. You are older than 57 years of age and your health care provider tells you that you are at risk for this type of infection. Your sexual activity has changed since you were last screened, and you are at increased risk for chlamydia or gonorrhea. Ask your health care provider if you are at risk. Ask your health care provider about whether you are at high risk for HIV. Your health care provider may recommend a prescription medicine to help prevent HIV infection. If you choose to take medicine to prevent HIV, you should first get tested for HIV. You should then be tested every 3 months for as long as you are taking the medicine. Pregnancy If you are about to stop having your period (premenopausal) and you may become pregnant, seek counseling  before you get pregnant. Take 400 to 800 micrograms (mcg) of folic acid every day if you become pregnant. Ask for birth control (contraception) if you want to prevent pregnancy. Osteoporosis and menopause Osteoporosis is a disease in which the bones lose minerals and strength with aging. This can result in bone fractures. If you are 86 years old or older, or if you are at risk for osteoporosis and fractures, ask your health care provider if you should: Be screened for bone loss. Take a calcium or vitamin D supplement to lower your risk of fractures. Be given hormone replacement therapy (HRT) to treat symptoms of menopause. Follow these instructions at home: Alcohol use Do not  drink alcohol if: Your health care provider tells you not to drink. You are pregnant, may be pregnant, or are planning to become pregnant. If you drink alcohol: Limit how much you have to: 0-1 drink a day. Know how much alcohol is in your drink. In the [place] one drink equals one 12 oz bottle of beer (355 mL), one 5 oz glass of wine (148 mL), or one 1 oz glass of hard liquor (44 mL). Lifestyle Do not use any products that contain nicotine or tobacco. These products include cigarettes, chewing tobacco, and vaping devices, such as e-cigarettes. If you need help quitting, ask your health care provider. Do not use street drugs. Do not share needles. Ask your health care provider for help if you need support or information about quitting drugs. General instructions Schedule regular health, dental, and eye exams. Stay current with your vaccines. Tell your health care provider if: You often feel depressed. You have ever been abused or do not feel safe at home. Summary Adopting a healthy lifestyle and getting preventive care are important in promoting health and wellness. Follow your health care provider's instructions about healthy diet, exercising, and getting tested or screened for diseases. Follow your health care provider's instructions on monitoring your cholesterol and blood pressure. This information is not intended to replace advice given to you by your health care provider. Make sure you discuss any questions you have with your health care provider. Document Revised: 10/20/2020 Document Reviewed: 10/20/2020 Elsevier Patient Education  [place].

## 2022-03-30 NOTE — Telephone Encounter (Signed)
Dr. Rebekah Chesterfield information  [facility]   [address]   [place], [place] [address]   [phone]    I will reach out to them.

## 2022-03-31 NOTE — Telephone Encounter (Signed)
Called and LVM with Dr. Rebekah Chesterfield Triage nurse asking them to contact patient to get in with them ASAP, let them know I sent records with this request two weeks ago. Asked for a call back with any questions.

## 2022-04-01 DIAGNOSIS — Z79891 Long term (current) use of opiate analgesic: Secondary | ICD-10-CM | POA: Diagnosis not present

## 2022-04-01 DIAGNOSIS — M5459 Other low back pain: Secondary | ICD-10-CM | POA: Diagnosis not present

## 2022-04-01 DIAGNOSIS — Z79899 Other long term (current) drug therapy: Secondary | ICD-10-CM | POA: Diagnosis not present

## 2022-04-01 DIAGNOSIS — Z6826 Body mass index (BMI) 26.0-26.9, adult: Secondary | ICD-10-CM | POA: Diagnosis not present

## 2022-04-01 DIAGNOSIS — R69 Illness, unspecified: Secondary | ICD-10-CM | POA: Diagnosis not present

## 2022-04-29 DIAGNOSIS — R69 Illness, unspecified: Secondary | ICD-10-CM | POA: Diagnosis not present

## 2022-04-29 DIAGNOSIS — Z6826 Body mass index (BMI) 26.0-26.9, adult: Secondary | ICD-10-CM | POA: Diagnosis not present

## 2022-04-29 DIAGNOSIS — M5459 Other low back pain: Secondary | ICD-10-CM | POA: Diagnosis not present

## 2022-04-29 DIAGNOSIS — Z79899 Other long term (current) drug therapy: Secondary | ICD-10-CM | POA: Diagnosis not present

## 2022-04-29 DIAGNOSIS — Z79891 Long term (current) use of opiate analgesic: Secondary | ICD-10-CM | POA: Diagnosis not present

## 2022-05-01 ENCOUNTER — Other Ambulatory Visit: Payer: Self-pay | Admitting: Physician Assistant

## 2022-05-01 DIAGNOSIS — G2581 Restless legs syndrome: Secondary | ICD-10-CM

## 2022-05-02 DIAGNOSIS — Z79899 Other long term (current) drug therapy: Secondary | ICD-10-CM | POA: Diagnosis not present

## 2022-05-09 ENCOUNTER — Encounter: Payer: Self-pay | Admitting: Physician Assistant

## 2022-05-23 IMAGING — CT CT HEAD W/O CM
4 series · 16 of 47 positions shown, 18 images · non-contrast
Comparison: CT head 02/26/2014.

CLINICAL DATA: Transient ischemic attack (TIA) stammuring/word
finding difficultly

EXAM:
CT HEAD WITHOUT CONTRAST
TECHNIQUE: Contiguous axial images were obtained from the base of the skull
through the vertex without intravenous contrast.

[Series 2: head wo · axial · 0.42mm/px · z∈[+118,+233]mm · 7 of 31 slices shown, 9 images]
[im 4/31  brain]
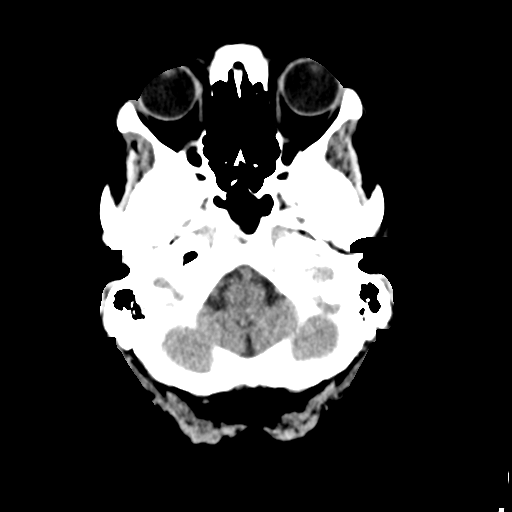
[im 4/31  bone]
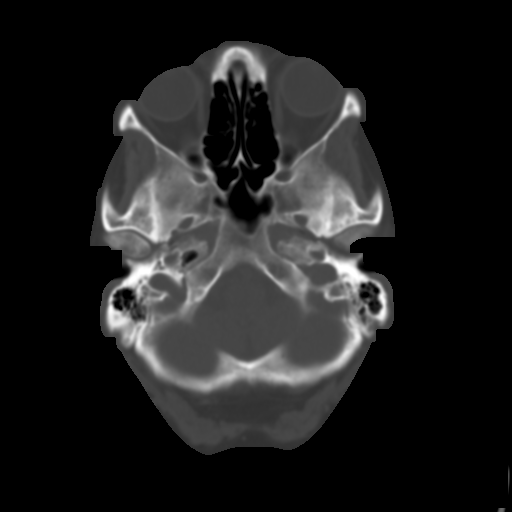
[im 8/31  brain]
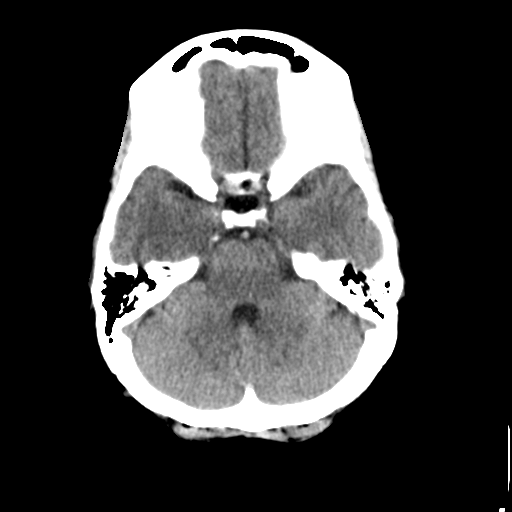
[im 12/31  brain]
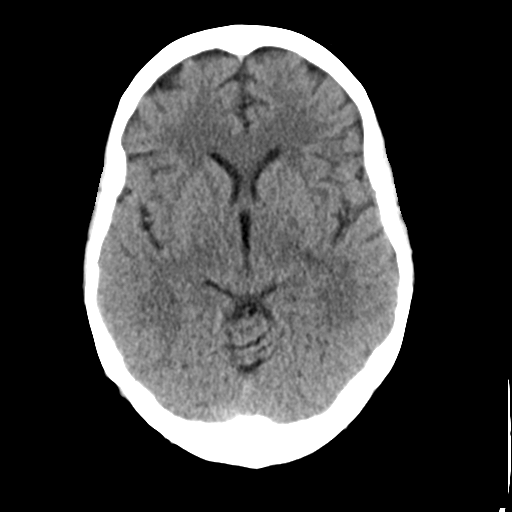
[im 16/31  brain]
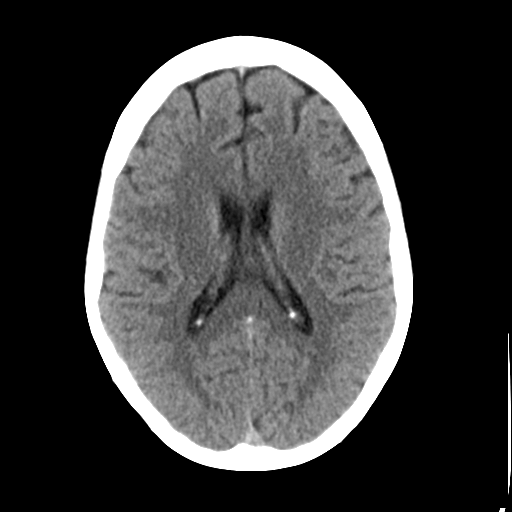
[im 19/31  brain]
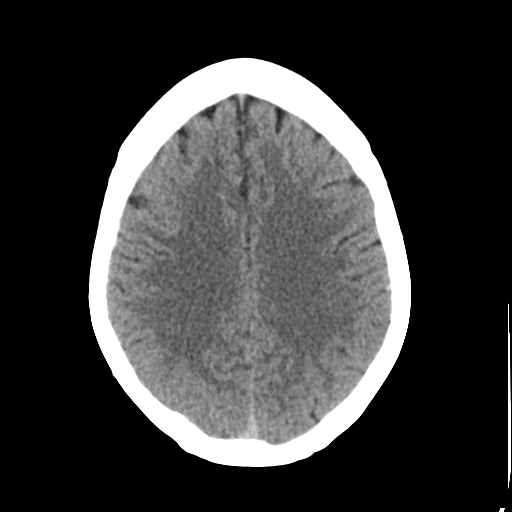
[im 19/31  bone]
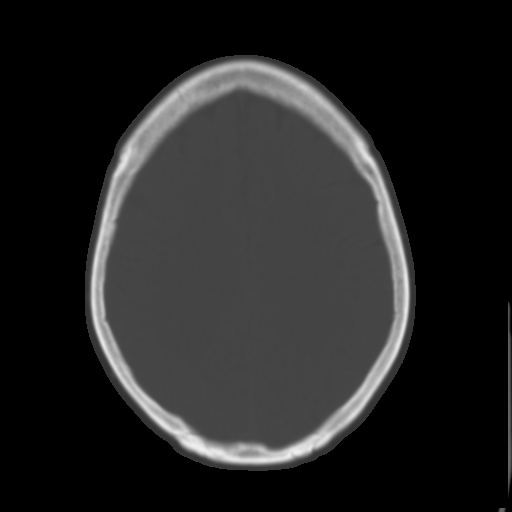
[im 23/31  brain]
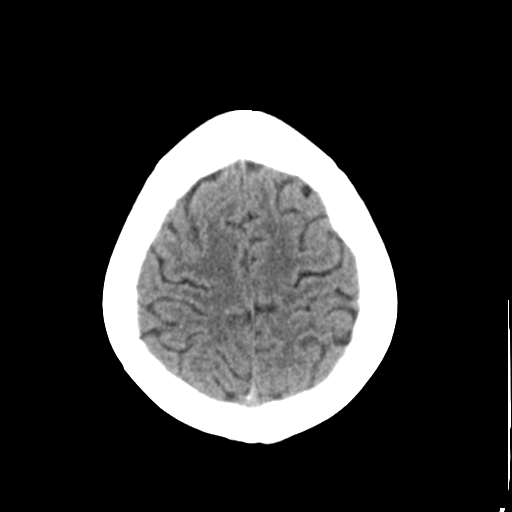
[im 27/31  brain]
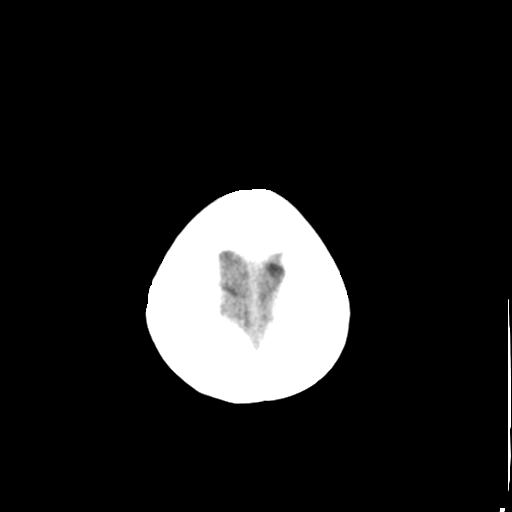

[Series 3: head bone 2x2 · axial · 0.42mm/px · z∈[+117,+147]mm · 3 of 76 slices shown]
[im 8/76  bone]
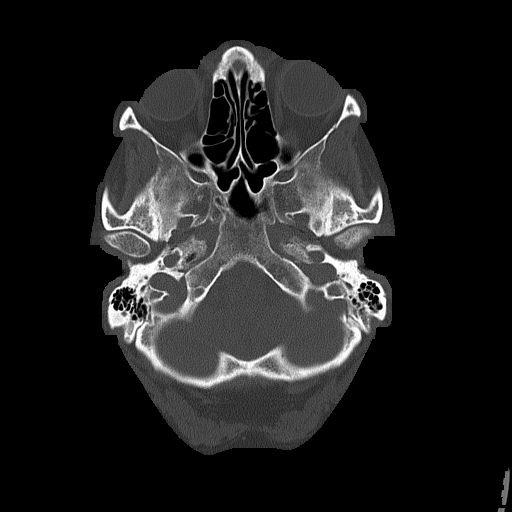
[im 16/76  bone]
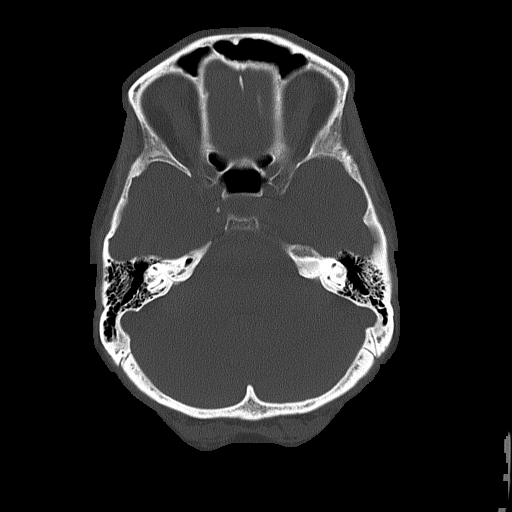
[im 23/76  bone]
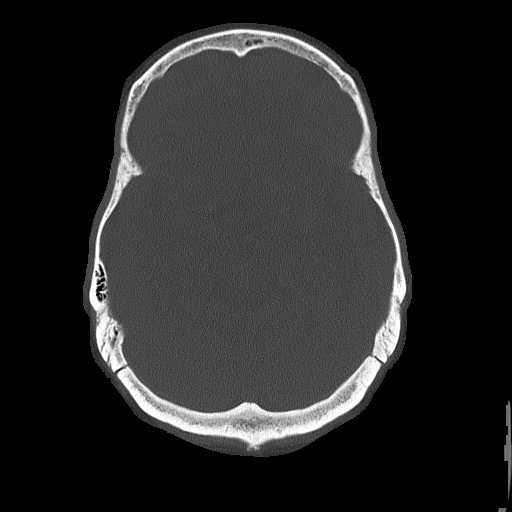

[Series 4: head wo coronal · coronal · 0.32mm/px · 3 of 67 slices shown]
[im 23/67  brain]
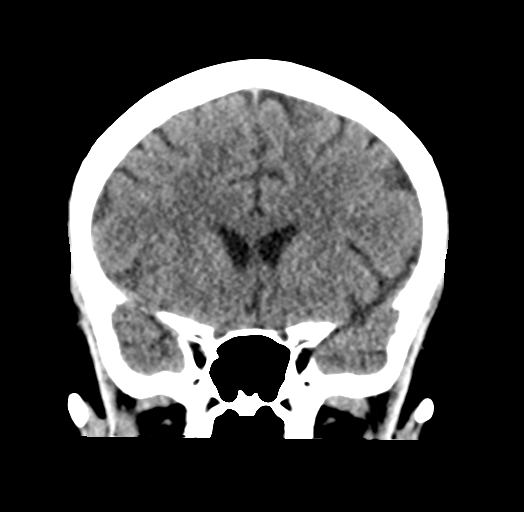
[im 30/67  brain]
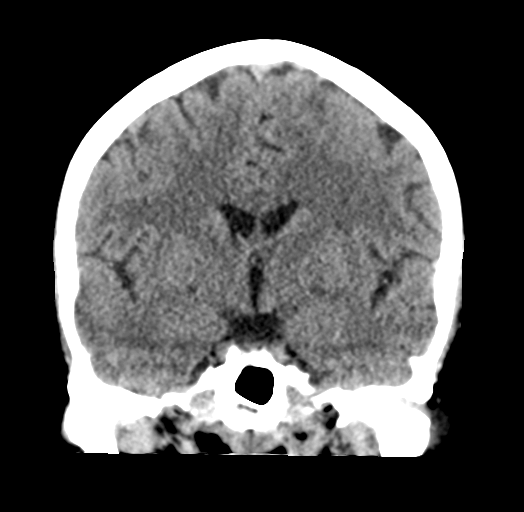
[im 37/67  brain]
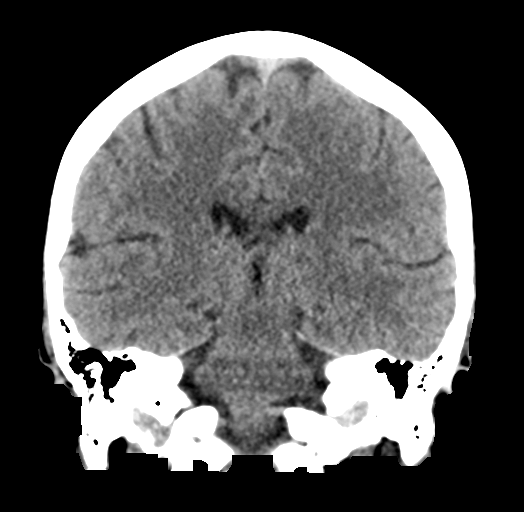

[Series 5: head wo sagittal · sagittal · 0.32mm/px · 3 of 57 slices shown]
[im 19/57  brain]
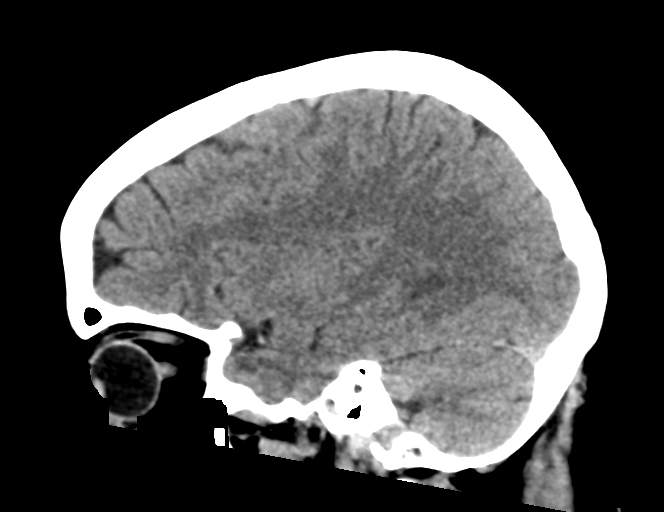
[im 29/57  brain]
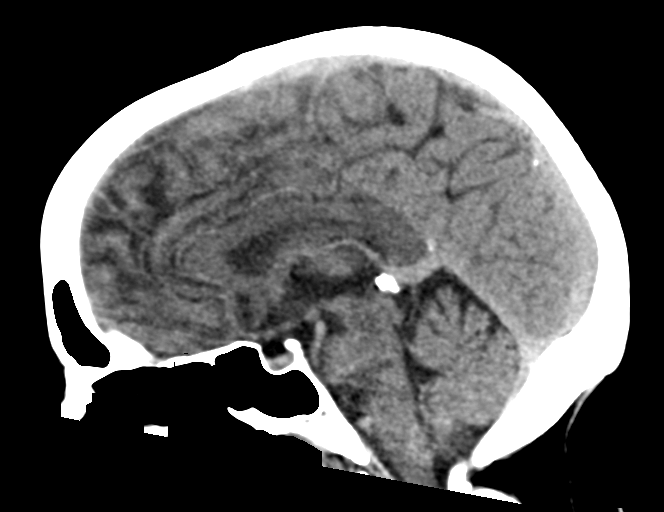
[im 38/57  brain]
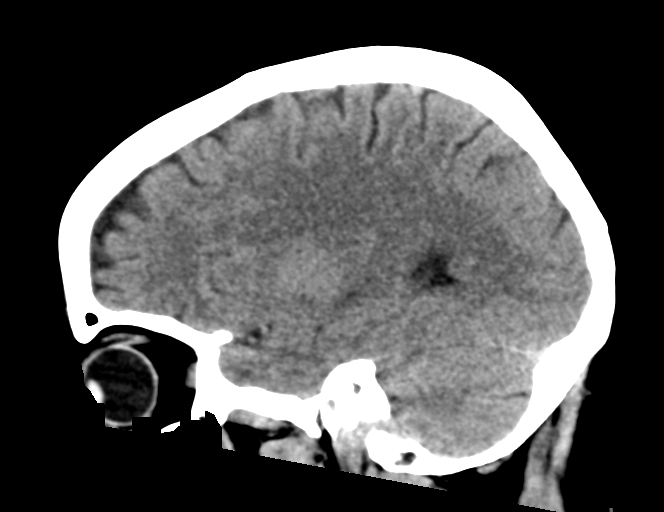

[16 of 47 positions shown; findings below may reference images not displayed]

FINDINGS: Brain: No evidence of acute large vascular territory infarction,
hemorrhage, hydrocephalus, extra-axial collection or mass
lesion/mass effect. Mild patchy white matter hypoattenuation,
nonspecific but compatible with chronic microvascular ischemic
disease. Mild ill-defined hypodensity in the posterolateral left
basal ganglia peers similar to prior.

Vascular: No hyperdense vessel identified.

Skull: No acute fracture.

Sinuses/Orbits: Visualized sinuses are clear. No acute orbital
findings.

Other: No mastoid effusions.
IMPRESSION: 1. No evidence of acute intracranial abnormality. MRI could provide
more sensitive evaluation for acute infarct if clinically indicated.
2. Mild chronic microvascular ischemic disease.

## 2022-05-23 IMAGING — US US CAROTID DUPLEX BILAT
2 series · 13 of 24 positions shown · non-contrast
Comparison: None.

CLINICAL DATA: Hypertension

Stuttering
Difficulty finding words
Hyperlipidemia
EXAM:
BILATERAL CAROTID DUPLEX ULTRASOUND
TECHNIQUE: Gray scale imaging, color Doppler and duplex ultrasound were
performed of bilateral carotid and vertebral arteries in the neck.

[Series 1: us carotid bilateral · 11 of 56 slices shown]
[im 1/56]
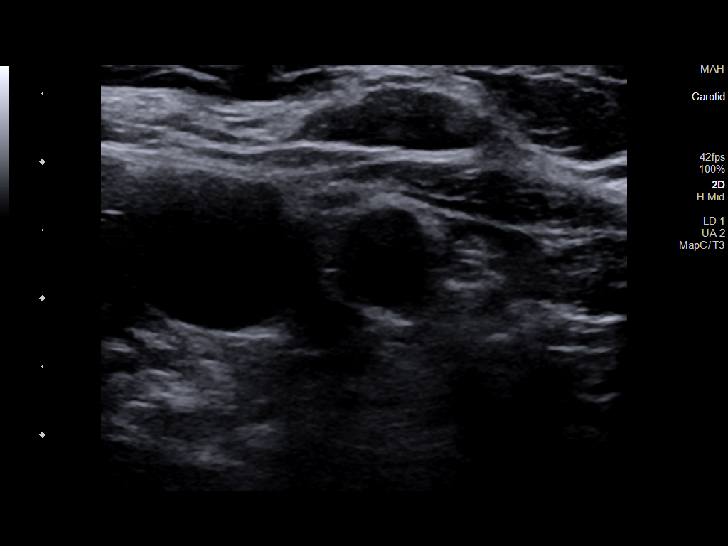
[im 6/56]
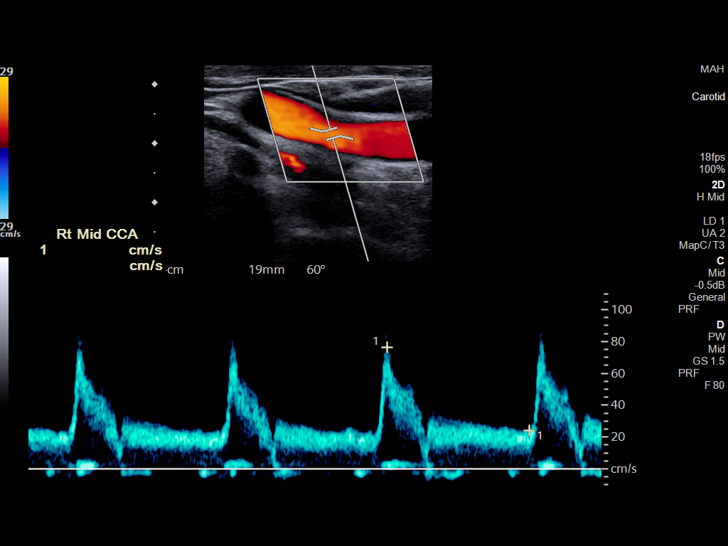
[im 12/56]
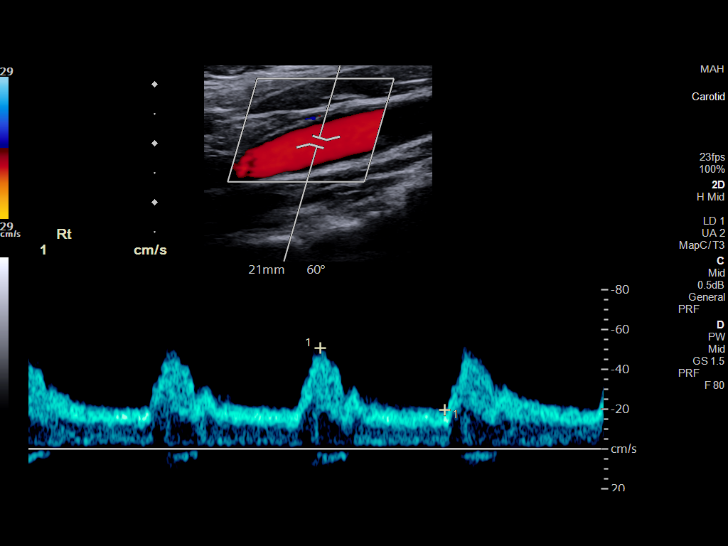
[im 17/56]
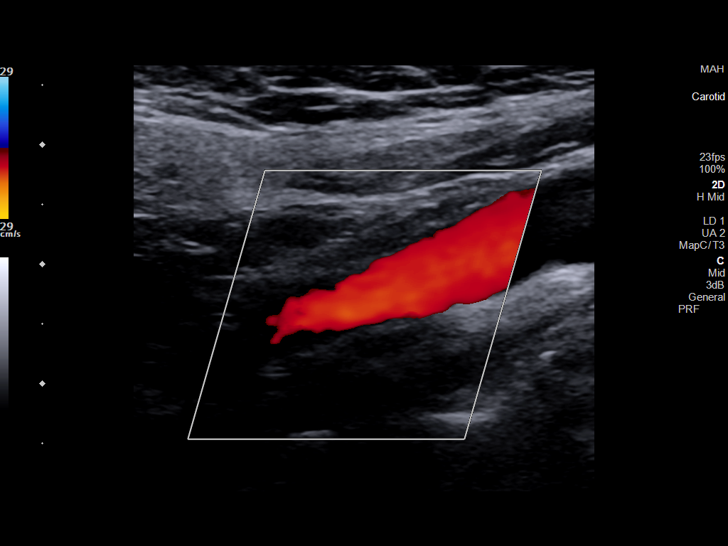
[im 23/56]
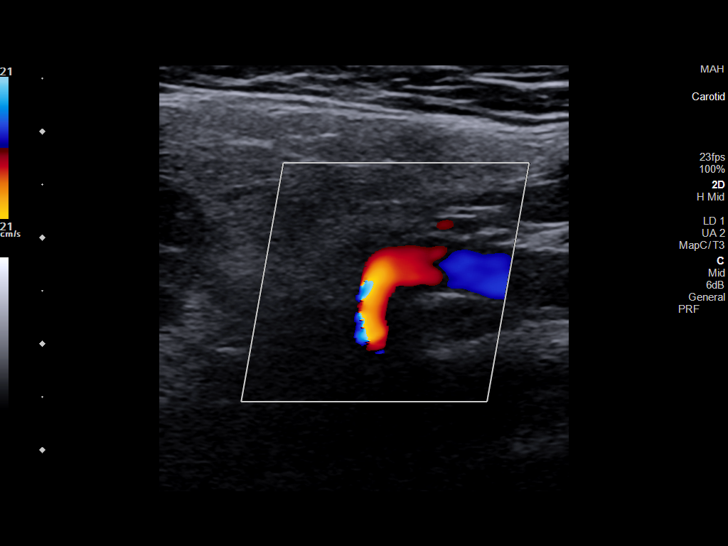
[im 28/56]
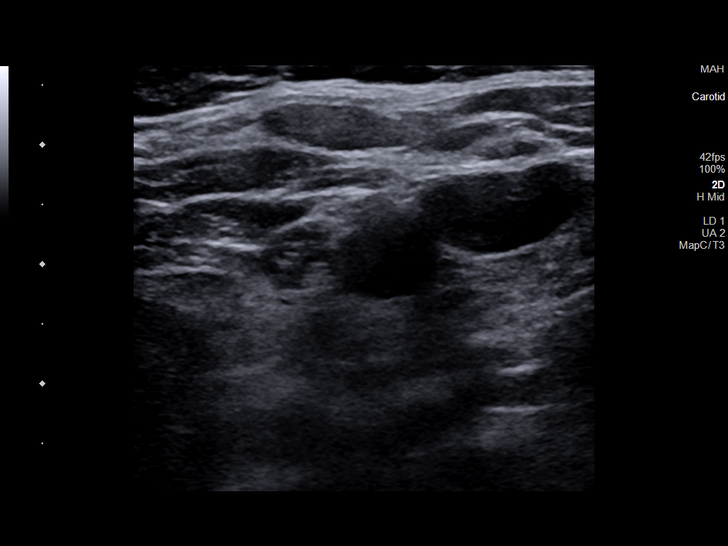
[im 34/56]
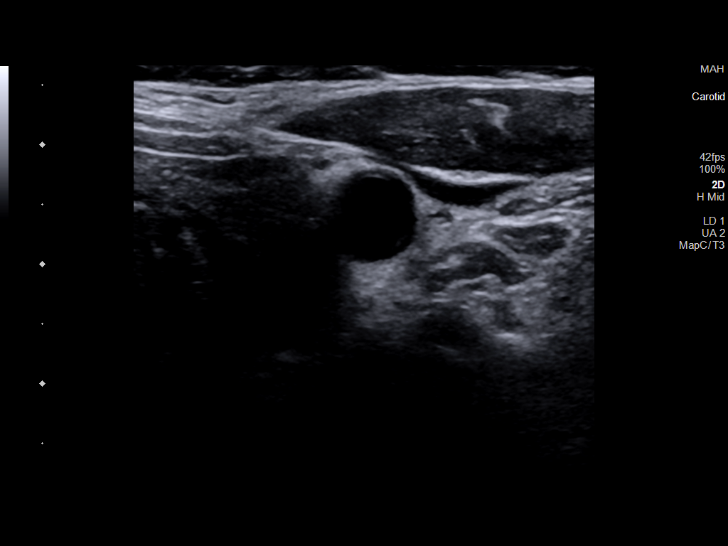
[im 36/56]
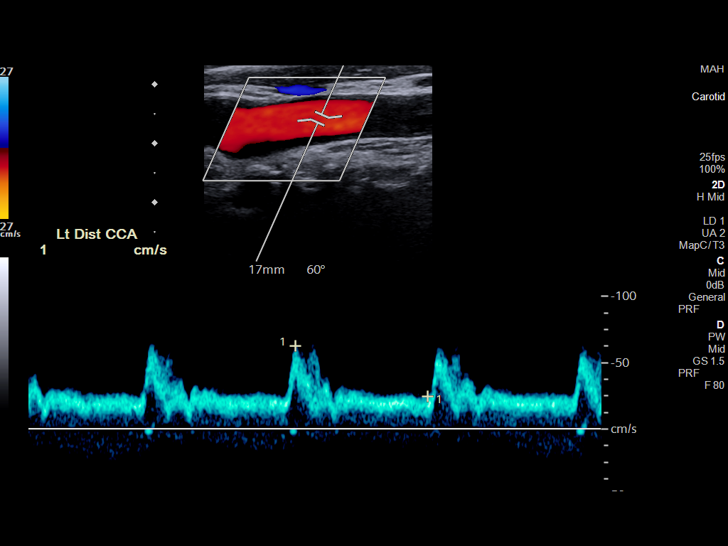
[im 42/56]
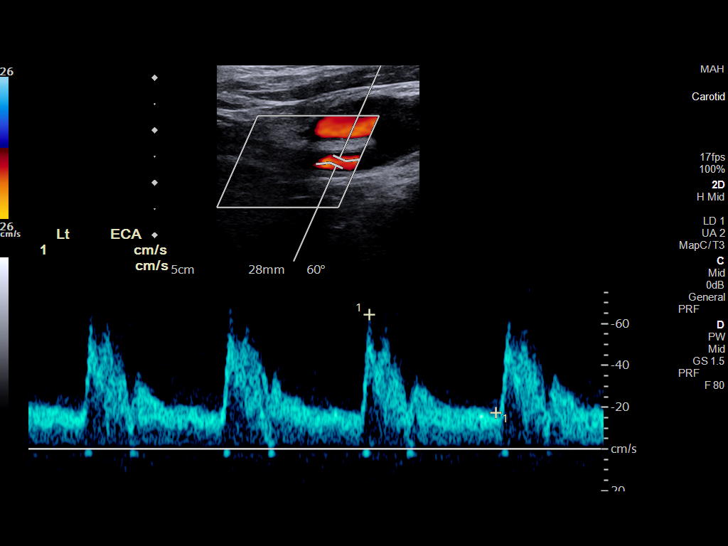
[im 47/56]
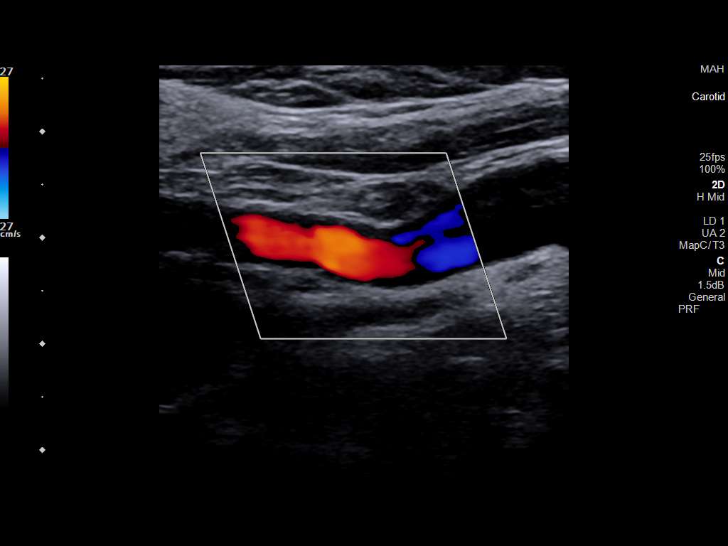
[im 53/56]
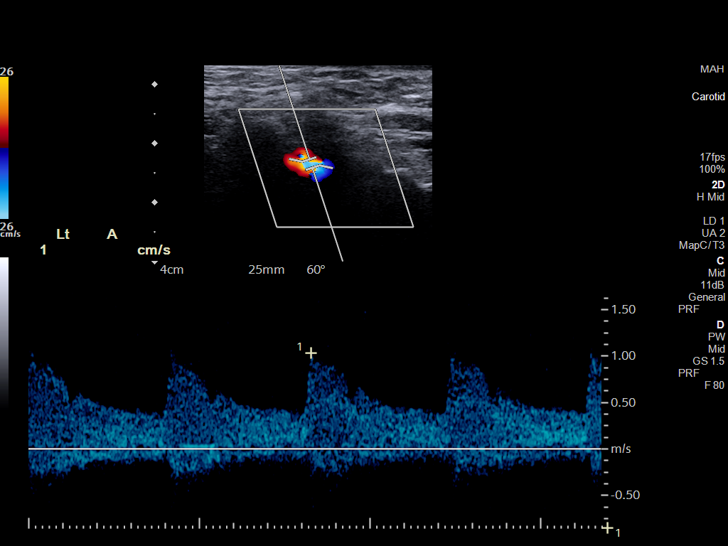

[Series 1001: carotid · 2 of 7 slices shown]
[im 1/7]
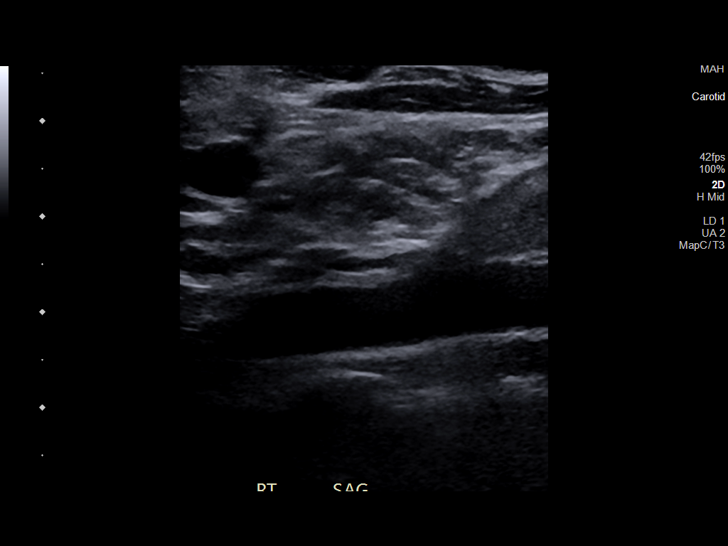
[im 7/7]
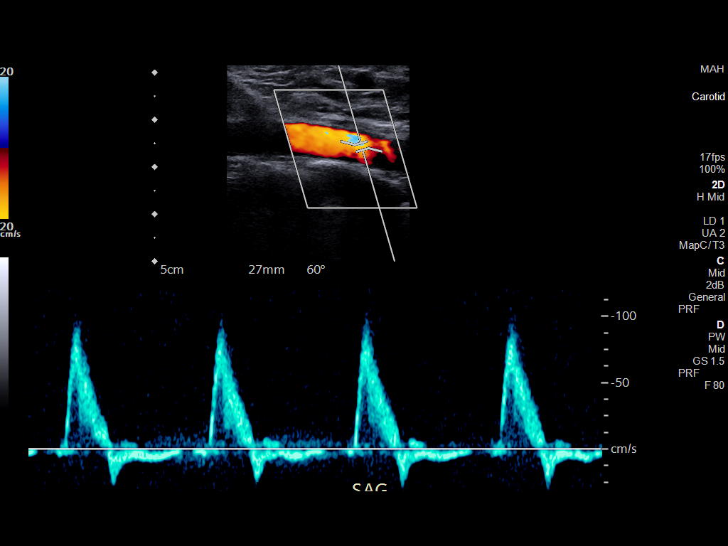

[13 of 24 positions shown; findings below may reference images not displayed]

FINDINGS: Criteria: Quantification of carotid stenosis is based on velocity
parameters that correlate the residual internal carotid diameter
with NASCET-based stenosis levels, using the diameter of the distal
internal carotid lumen as the denominator for stenosis measurement.

The following velocity measurements were obtained:

RIGHT

ICA: 69/33 cm/sec

CCA: 76/24 cm/sec

SYSTOLIC ICA/CCA RATIO:

ECA: 81 cm/sec

LEFT

ICA: 75/26 cm/sec

CCA: 94/30 cm/sec

SYSTOLIC ICA/CCA RATIO:

ECA: 64 cm/sec

RIGHT CAROTID ARTERY: No significant atheromatous plaque.

RIGHT VERTEBRAL ARTERY:  Antegrade flow.

LEFT CAROTID ARTERY:  No significant atheromatous plaque.

LEFT VERTEBRAL ARTERY:  Antegrade flow.
IMPRESSION: No significant stenosis of internal carotid arteries.

## 2022-05-28 DIAGNOSIS — R69 Illness, unspecified: Secondary | ICD-10-CM | POA: Diagnosis not present

## 2022-05-28 DIAGNOSIS — Z79891 Long term (current) use of opiate analgesic: Secondary | ICD-10-CM | POA: Diagnosis not present

## 2022-05-28 DIAGNOSIS — Z6823 Body mass index (BMI) 23.0-23.9, adult: Secondary | ICD-10-CM | POA: Diagnosis not present

## 2022-05-28 DIAGNOSIS — Z79899 Other long term (current) drug therapy: Secondary | ICD-10-CM | POA: Diagnosis not present

## 2022-05-28 DIAGNOSIS — M5459 Other low back pain: Secondary | ICD-10-CM | POA: Diagnosis not present

## 2022-06-24 ENCOUNTER — Telehealth: Payer: Self-pay | Admitting: Physician Assistant

## 2022-06-24 NOTE — Telephone Encounter (Signed)
Patient called today stated her son died and she would like to speak with you  Her phone number is [phone]

## 2022-06-24 NOTE — Telephone Encounter (Signed)
Pt spouseRonalee [name]) came into the office requesting Rx for anxiety for patient. Pt husband states his stepson past away earlier today. Please contact spouse at [phone].

## 2022-06-25 ENCOUNTER — Telehealth (INDEPENDENT_AMBULATORY_CARE_PROVIDER_SITE_OTHER): Payer: Medicare HMO | Admitting: Physician Assistant

## 2022-06-25 ENCOUNTER — Encounter: Payer: Self-pay | Admitting: Physician Assistant

## 2022-06-25 DIAGNOSIS — F4323 Adjustment disorder with mixed anxiety and depressed mood: Secondary | ICD-10-CM

## 2022-06-25 DIAGNOSIS — R69 Illness, unspecified: Secondary | ICD-10-CM | POA: Diagnosis not present

## 2022-06-25 DIAGNOSIS — F4321 Adjustment disorder with depressed mood: Secondary | ICD-10-CM

## 2022-06-25 DIAGNOSIS — Z634 Disappearance and death of family member: Secondary | ICD-10-CM

## 2022-06-25 MED ORDER — DIAZEPAM 5 MG PO TABS: ORAL_TABLET | ORAL | 0 refills | Status: DC

## 2022-06-25 NOTE — Progress Notes (Signed)
..Virtual Visit via Telephone Note  I connected with [name] on 06/25/22 at  1:00 PM EST by telephone and verified that I am speaking with the correct person using two identifiers.  Location: Patient: home Provider: clinic  .[name] KitchenParticipating in visit:  Patient: [name] Provider:Dariusz Brase PA-C   I discussed the limitations, risks, security and privacy concerns of performing an evaluation and management service by telephone and the availability of in person appointments. I also discussed with the patient that there may be a patient responsible charge related to this service. The patient expressed understanding and agreed to proceed.   History of Present Illness: Pt 57 yo female with Depression and anxiety who calls into the clinic requesting valium after finding her 94 yo son dead yesterday. She is very upset and cannot sleep. She does have her husband for support. She used valium when her mother died and was very helpful. No SI/HC.    [name]. Active Ambulatory Problems    Diagnosis Date Noted   TOBACCO ABUSE 05/28/2009   MITRAL VALVE PROLAPSE 05/28/2009   UNSPECIFIED CARDIAC DYSRHYTHMIA 05/16/2009   TMJ PAIN 07/26/2008   GASTRIC ULCER 05/26/2009   NEPHROLITHIASIS 03/06/2010   HEMATURIA UNSPECIFIED 12/19/2008   OVARIAN CYST 03/06/2010   PALPITATIONS 05/16/2009   HEART MURMUR, HX OF 05/26/2009   Depressive disorder, not elsewhere classified 11/06/2010   Lymphocytic colitis 12/01/2011   EBV infection 04/21/2012   Chronic pain syndrome 06/21/2012   Fibromyalgia 06/21/2012   Carpal tunnel syndrome, bilateral 06/21/2012   Degenerative disc disease, cervical 06/21/2012   Facet arthropathy, cervical 06/21/2012   DDD (degenerative disc disease), lumbosacral 06/21/2012   Lower abdominal pain 07/26/2012   Interstitial cystitis (chronic) with hematuria 04/13/2013   Primary osteoarthritis of right knee 12/20/2013   Incontinence in female 06/26/2014   Acute bilateral lower abdominal  pain 06/26/2014   Pain in joint, ankle and foot 04/21/2015   Status post implantation of urinary electronic stimulator device 05/05/2015   Post-menopausal 06/20/2015   Family history of cardiovascular disease 06/20/2015   Drug induced constipation 06/20/2015   No energy 06/20/2015   Dysuria 06/20/2015   Hiatal hernia 07/16/2015   Depression 09/03/2015   Primary osteoarthritis of left hand 02/27/2016   Breast pain, right 09/14/2016   Abnormal weight gain 03/16/2017   BMI 27.0-27.9,adult 03/16/2017   History of gastric ulcer 04/09/2019   RLS (restless legs syndrome) 04/09/2019   Mild episode of recurrent major depressive disorder ([place]) 03/17/2021   Stuttering 03/17/2021   Elevated LDL cholesterol level 03/17/2021   Word finding difficulty 03/17/2021   Sleep talking 03/17/2021   Abnormal MRI of head 05/19/2021   Chronic, continuous use of opioids 09/28/2013   Smoker 05/28/2009   Hyponatremia 06/10/2021   Anorexia 06/10/2021   Adult failure to thrive 06/10/2021   Acute lower UTI 06/10/2021   Elevated lipase 06/17/2021   Fatty liver 06/17/2021   Common bile duct dilatation 06/17/2021   Hypotension 11/20/2021   Malaise 11/20/2021   History of pyelonephritis 11/20/2021   Alcohol abuse, in remission 02/26/2022   Left forearm pain 02/26/2022   Alcohol withdrawal hallucinosis ([name]) 02/26/2022   Abnormal urine odor 02/26/2022   Frequent falls 02/26/2022   Memory changes 02/26/2022   Grief at loss of child 06/25/2022   Adjustment disorder with mixed anxiety and depressed mood 06/25/2022   Resolved Ambulatory Problems    Diagnosis Date Noted   Other change of lacrimal passages 03/01/2010   URI 08/17/2008   STONE, URINARY CALCULUS,UNSPEC. 12/29/2008  FOOT PAIN, LEFT 04/14/2010   Diarrhea 11/04/2009   CONTUSION, LEFT HAND 05/10/2009   CHEMICAL BURN 04/14/2010   Nephrolithiasis 50/56/9794   Bacterial folliculitis 80/16/5537   Acute pharyngitis 01/30/2011   BRONCHITIS, ACUTE  02/04/2011   ABDOMINAL PAIN, ACUTE 02/04/2011   Past Medical History:  Diagnosis Date   Anxiety    Cancer ([place])    Colitis    Dysrhythmia    Gastric ulcer    Headache(784.0)    Heart murmur    IBS (irritable bowel syndrome)    Interstitial cystitis    MVP (mitral valve prolapse)    Urinary calculus or stone     Observations/Objective: No acute distress Tearful and emotional  Assessment and Plan: [name] KitchenMarland KitchenAngelynn was seen today for anxiety.  Diagnoses and all orders for this visit:  Adjustment disorder with mixed anxiety and depressed mood -     diazepam (VALIUM) 5 MG tablet; Take one tablet as needed every 12 hours for acute anxiety.  Grief at loss of child -     diazepam (VALIUM) 5 MG tablet; Take one tablet as needed every 12 hours for acute anxiety.   Valium sent to pharmacy to use sparingly and not with morphine. Pt is aware of this and increased risk of sudden death.  Not ready for counseling at this time.    Follow Up Instructions:    I discussed the assessment and treatment plan with the patient. The patient was provided an opportunity to ask questions and all were answered. The patient agreed with the plan and demonstrated an understanding of the instructions.   The patient was advised to call back or seek an in-person evaluation if the symptoms worsen or if the condition fails to improve as anticipated.  I provided 10 minutes of non-face-to-face time during this encounter.   Iran Planas, PA-C

## 2022-06-25 NOTE — Telephone Encounter (Signed)
She has appt for this afternoon.

## 2022-06-25 NOTE — Telephone Encounter (Signed)
Spoke with patient's husband

## 2022-07-05 DIAGNOSIS — Z6826 Body mass index (BMI) 26.0-26.9, adult: Secondary | ICD-10-CM | POA: Diagnosis not present

## 2022-07-05 DIAGNOSIS — R69 Illness, unspecified: Secondary | ICD-10-CM | POA: Diagnosis not present

## 2022-07-05 DIAGNOSIS — M5459 Other low back pain: Secondary | ICD-10-CM | POA: Diagnosis not present

## 2022-07-05 DIAGNOSIS — Z79899 Other long term (current) drug therapy: Secondary | ICD-10-CM | POA: Diagnosis not present

## 2022-07-05 DIAGNOSIS — Z79891 Long term (current) use of opiate analgesic: Secondary | ICD-10-CM | POA: Diagnosis not present

## 2022-07-07 DIAGNOSIS — Z79899 Other long term (current) drug therapy: Secondary | ICD-10-CM | POA: Diagnosis not present

## 2022-07-23 ENCOUNTER — Other Ambulatory Visit: Payer: Self-pay | Admitting: Physician Assistant

## 2022-07-23 DIAGNOSIS — R110 Nausea: Secondary | ICD-10-CM

## 2022-08-02 DIAGNOSIS — R339 Retention of urine, unspecified: Secondary | ICD-10-CM | POA: Diagnosis not present

## 2022-08-04 DIAGNOSIS — R69 Illness, unspecified: Secondary | ICD-10-CM | POA: Diagnosis not present

## 2022-08-04 DIAGNOSIS — Z79899 Other long term (current) drug therapy: Secondary | ICD-10-CM | POA: Diagnosis not present

## 2022-08-04 DIAGNOSIS — M5459 Other low back pain: Secondary | ICD-10-CM | POA: Diagnosis not present

## 2022-08-04 DIAGNOSIS — Z6826 Body mass index (BMI) 26.0-26.9, adult: Secondary | ICD-10-CM | POA: Diagnosis not present

## 2022-08-04 DIAGNOSIS — F1120 Opioid dependence, uncomplicated: Secondary | ICD-10-CM | POA: Diagnosis not present

## 2022-08-04 DIAGNOSIS — F17210 Nicotine dependence, cigarettes, uncomplicated: Secondary | ICD-10-CM | POA: Diagnosis not present

## 2022-08-04 DIAGNOSIS — Z79891 Long term (current) use of opiate analgesic: Secondary | ICD-10-CM | POA: Diagnosis not present

## 2022-08-06 DIAGNOSIS — Z79899 Other long term (current) drug therapy: Secondary | ICD-10-CM | POA: Diagnosis not present

## 2022-08-22 ENCOUNTER — Other Ambulatory Visit: Payer: Self-pay | Admitting: Physician Assistant

## 2022-08-22 DIAGNOSIS — K449 Diaphragmatic hernia without obstruction or gangrene: Secondary | ICD-10-CM

## 2022-08-22 DIAGNOSIS — Z8711 Personal history of peptic ulcer disease: Secondary | ICD-10-CM

## 2022-09-02 DIAGNOSIS — N329 Bladder disorder, unspecified: Secondary | ICD-10-CM | POA: Diagnosis not present

## 2022-09-02 DIAGNOSIS — G8929 Other chronic pain: Secondary | ICD-10-CM | POA: Diagnosis not present

## 2022-09-02 DIAGNOSIS — R102 Pelvic and perineal pain: Secondary | ICD-10-CM | POA: Diagnosis not present

## 2022-09-02 DIAGNOSIS — Z79899 Other long term (current) drug therapy: Secondary | ICD-10-CM | POA: Diagnosis not present

## 2022-09-02 DIAGNOSIS — N3010 Interstitial cystitis (chronic) without hematuria: Secondary | ICD-10-CM | POA: Diagnosis not present

## 2022-09-02 DIAGNOSIS — R69 Illness, unspecified: Secondary | ICD-10-CM | POA: Diagnosis not present

## 2022-09-02 DIAGNOSIS — Z6826 Body mass index (BMI) 26.0-26.9, adult: Secondary | ICD-10-CM | POA: Diagnosis not present

## 2022-09-08 DIAGNOSIS — Z79899 Other long term (current) drug therapy: Secondary | ICD-10-CM | POA: Diagnosis not present

## 2022-09-24 DIAGNOSIS — R339 Retention of urine, unspecified: Secondary | ICD-10-CM | POA: Diagnosis not present

## 2022-09-27 ENCOUNTER — Encounter: Payer: Self-pay | Admitting: Physician Assistant

## 2022-09-27 ENCOUNTER — Ambulatory Visit (INDEPENDENT_AMBULATORY_CARE_PROVIDER_SITE_OTHER): Payer: Medicare HMO | Admitting: Physician Assistant

## 2022-09-27 VITALS — BP 136/79 | HR 91 | Ht 66.0 in

## 2022-09-27 DIAGNOSIS — F4321 Adjustment disorder with depressed mood: Secondary | ICD-10-CM | POA: Diagnosis not present

## 2022-09-27 DIAGNOSIS — Z1231 Encounter for screening mammogram for malignant neoplasm of breast: Secondary | ICD-10-CM

## 2022-09-27 DIAGNOSIS — F4310 Post-traumatic stress disorder, unspecified: Secondary | ICD-10-CM | POA: Diagnosis not present

## 2022-09-27 DIAGNOSIS — R69 Illness, unspecified: Secondary | ICD-10-CM | POA: Diagnosis not present

## 2022-09-27 DIAGNOSIS — Z634 Disappearance and death of family member: Secondary | ICD-10-CM | POA: Diagnosis not present

## 2022-09-27 MED ORDER — FLUOXETINE HCL 10 MG PO CAPS: 10.0000 mg | ORAL_CAPSULE | Freq: Every day | ORAL | 0 refills | Status: DC

## 2022-09-27 NOTE — Patient Instructions (Signed)
Added prozac.

## 2022-09-27 NOTE — Progress Notes (Signed)
Established Patient Office Visit  Subjective   Patient ID: [name], female    DOB: 1966-04-19  Age: 57 y.o. MRN: [id number]  Chief Complaint  Patient presents with   Follow-up    HPI Pt is a 57 yo female who presents to the clinic to follow up on anxiety, depression and grief for the last 3 months after the sudden death of her son. She found him dead in his apartment and remembers beating on his chest. She used valium a few times. She cries a lot. She talks to her husband, sister, and daughter. She does not want to go to counseling.    ROS   See HPI.  Objective:     BP 136/79   Pulse 91   Ht 5\' 6"  (1.676 m)   SpO2 99%   BMI 23.08 kg/m  BP Readings from Last 3 Encounters:  09/27/22 136/79  02/26/22 (!) 149/96  12/02/21 (!) 138/97   Wt Readings from Last 3 Encounters:  02/26/22 143 lb (64.9 kg)  12/02/21 156 lb (70.8 kg)  11/20/21 153 lb (69.4 kg)    ..    09/27/2022   11:34 AM 03/24/2022   10:47 AM 08/17/2021   10:42 AM 03/16/2021    1:16 PM 08/29/2020   11:14 AM  Depression screen PHQ 2/9  Decreased Interest 3 0 1 1 2   Down, Depressed, Hopeless 3 1 2 1 2   PHQ - 2 Score 6 1 3 2 4   Altered sleeping 3  3 3 1   Tired, decreased energy 3  1 1 1   Change in appetite 2  0 1 2  Feeling bad or failure about yourself  1  1 0 2  Trouble concentrating 3  0 0 1  Moving slowly or fidgety/restless 0  0 0 1  Suicidal thoughts 0  0 0 1  PHQ-9 Score 18  8 7 13   Difficult doing work/chores Very difficult  Somewhat difficult Not difficult at all Somewhat difficult   .[name]    09/27/2022   11:35 AM 08/17/2021   10:45 AM 08/29/2020   11:18 AM 04/02/2019   10:53 AM  GAD 7 : Generalized Anxiety Score  Nervous, Anxious, on Edge 3 1 2 1   Control/stop worrying 3 1 2 2   Worry too much - different things 2 1 2 2   Trouble relaxing 2 1 1 1   Restless 1 0 1 1  Easily annoyed or irritable 1 3 2 2   Afraid - awful might happen 3 1 2 1   Total GAD 7 Score 15 8 12 10   Anxiety  Difficulty Extremely difficult Somewhat difficult Somewhat difficult Somewhat difficult      Physical Exam Vitals reviewed.  Constitutional:      Appearance: Normal appearance.  HENT:     Head: Normocephalic.  Cardiovascular:     Rate and Rhythm: Normal rate and regular rhythm.  Pulmonary:     Effort: Pulmonary effort is normal.     Breath sounds: Normal breath sounds.  Musculoskeletal:     Right lower leg: No edema.     Left lower leg: No edema.  Neurological:     General: No focal deficit present.     Mental Status: She is alert and oriented to person, place, and time.  Psychiatric:     Comments: tearful        The 10-year ASCVD risk score (Arnett DK, et al., 2019) is: 6.7%    Assessment & Plan:  [name] KitchenMarland KitchenZylynn was  seen today for follow-up.  Diagnoses and all orders for this visit:  Grief at loss of child -     FLUoxetine (PROZAC) 10 MG capsule; Take 1 capsule (10 mg total) by mouth daily.  Encounter for screening mammogram for breast cancer -     MM Digital Screening; Future   PHQ/GAD not to goal Added prozac to cymbalta Pt declined any counseling Discussed mindfullness and self care Follow up as needed or in 4-6 weeks   Return in about 4 weeks (around 10/25/2022).    Tandy Gaw, PA-C

## 2022-10-01 DIAGNOSIS — F1120 Opioid dependence, uncomplicated: Secondary | ICD-10-CM | POA: Diagnosis not present

## 2022-10-01 DIAGNOSIS — F17210 Nicotine dependence, cigarettes, uncomplicated: Secondary | ICD-10-CM | POA: Diagnosis not present

## 2022-10-01 DIAGNOSIS — Z79899 Other long term (current) drug therapy: Secondary | ICD-10-CM | POA: Diagnosis not present

## 2022-10-01 DIAGNOSIS — R102 Pelvic and perineal pain: Secondary | ICD-10-CM | POA: Diagnosis not present

## 2022-10-01 DIAGNOSIS — Z6827 Body mass index (BMI) 27.0-27.9, adult: Secondary | ICD-10-CM | POA: Diagnosis not present

## 2022-10-01 DIAGNOSIS — G8929 Other chronic pain: Secondary | ICD-10-CM | POA: Diagnosis not present

## 2022-10-01 DIAGNOSIS — R69 Illness, unspecified: Secondary | ICD-10-CM | POA: Diagnosis not present

## 2022-10-01 DIAGNOSIS — N3010 Interstitial cystitis (chronic) without hematuria: Secondary | ICD-10-CM | POA: Diagnosis not present

## 2022-10-01 DIAGNOSIS — Z79891 Long term (current) use of opiate analgesic: Secondary | ICD-10-CM | POA: Diagnosis not present

## 2022-10-06 ENCOUNTER — Ambulatory Visit (INDEPENDENT_AMBULATORY_CARE_PROVIDER_SITE_OTHER): Payer: Medicare HMO

## 2022-10-06 DIAGNOSIS — Z1231 Encounter for screening mammogram for malignant neoplasm of breast: Secondary | ICD-10-CM

## 2022-10-06 DIAGNOSIS — Z79899 Other long term (current) drug therapy: Secondary | ICD-10-CM | POA: Diagnosis not present

## 2022-10-08 NOTE — Progress Notes (Signed)
Normal mammogram. Follow up in 1 year.

## 2022-10-13 ENCOUNTER — Other Ambulatory Visit: Payer: Self-pay | Admitting: Physician Assistant

## 2022-10-13 DIAGNOSIS — F4321 Adjustment disorder with depressed mood: Secondary | ICD-10-CM

## 2022-10-13 DIAGNOSIS — G2581 Restless legs syndrome: Secondary | ICD-10-CM

## 2022-10-25 ENCOUNTER — Encounter: Payer: Self-pay | Admitting: Physician Assistant

## 2022-10-25 ENCOUNTER — Ambulatory Visit (INDEPENDENT_AMBULATORY_CARE_PROVIDER_SITE_OTHER): Payer: Medicare HMO | Admitting: Physician Assistant

## 2022-10-25 VITALS — BP 147/88 | HR 94 | Ht 66.0 in | Wt 170.0 lb

## 2022-10-25 DIAGNOSIS — F4321 Adjustment disorder with depressed mood: Secondary | ICD-10-CM

## 2022-10-25 DIAGNOSIS — F4310 Post-traumatic stress disorder, unspecified: Secondary | ICD-10-CM

## 2022-10-25 DIAGNOSIS — F4323 Adjustment disorder with mixed anxiety and depressed mood: Secondary | ICD-10-CM

## 2022-10-25 DIAGNOSIS — Z634 Disappearance and death of family member: Secondary | ICD-10-CM

## 2022-10-25 NOTE — Progress Notes (Signed)
Established Patient Office Visit  Subjective   Patient ID: [name], female    DOB: 1966-05-28  Age: 57 y.o. MRN: [id number]  Chief Complaint  Patient presents with   Follow-up    HPI Pt is a 57 yo female who presents to the clinic for 4 week follow up on prozac for mood.   She is doing much better. She is sleeping better. She is not having the constant flash blacks on finding her son dead. She is able to enjoy things she previously had.  She struggled yesterday with mood on mothers day but overall doing well. She continues to have a strained relationship with her daughter but feels like she is handling this well. She denies any side effects from medication. No SI/HC.   Active Ambulatory Problems    Diagnosis Date Noted   TOBACCO ABUSE 05/28/2009   MITRAL VALVE PROLAPSE 05/28/2009   UNSPECIFIED CARDIAC DYSRHYTHMIA 05/16/2009   TMJ PAIN 07/26/2008   GASTRIC ULCER 05/26/2009   NEPHROLITHIASIS 03/06/2010   HEMATURIA UNSPECIFIED 12/19/2008   OVARIAN CYST 03/06/2010   PALPITATIONS 05/16/2009   HEART MURMUR, HX OF 05/26/2009   Depressive disorder, not elsewhere classified 11/06/2010   Lymphocytic colitis 12/01/2011   EBV infection 04/21/2012   Chronic pain syndrome 06/21/2012   Fibromyalgia 06/21/2012   Carpal tunnel syndrome, bilateral 06/21/2012   Degenerative disc disease, cervical 06/21/2012   Facet arthropathy, cervical 06/21/2012   DDD (degenerative disc disease), lumbosacral 06/21/2012   Lower abdominal pain 07/26/2012   Interstitial cystitis (chronic) with hematuria 04/13/2013   Primary osteoarthritis of right knee 12/20/2013   Incontinence in female 06/26/2014   Acute bilateral lower abdominal pain 06/26/2014   Pain in joint, ankle and foot 04/21/2015   Status post implantation of urinary electronic stimulator device 05/05/2015   Post-menopausal 06/20/2015   Family history of cardiovascular disease 06/20/2015   Drug induced constipation 06/20/2015   No  energy 06/20/2015   Dysuria 06/20/2015   Hiatal hernia 07/16/2015   Depression 09/03/2015   Primary osteoarthritis of left hand 02/27/2016   Breast pain, right 09/14/2016   Abnormal weight gain 03/16/2017   BMI 27.0-27.9,adult 03/16/2017   History of gastric ulcer 04/09/2019   RLS (restless legs syndrome) 04/09/2019   Mild episode of recurrent major depressive disorder (HCC) 03/17/2021   Stuttering 03/17/2021   Elevated LDL cholesterol level 03/17/2021   Word finding difficulty 03/17/2021   Sleep talking 03/17/2021   Abnormal MRI of head 05/19/2021   Chronic, continuous use of opioids 09/28/2013   Smoker 05/28/2009   Hyponatremia 06/10/2021   Anorexia 06/10/2021   Adult failure to thrive 06/10/2021   Acute lower UTI 06/10/2021   Elevated lipase 06/17/2021   Fatty liver 06/17/2021   Common bile duct dilatation 06/17/2021   Hypotension 11/20/2021   Malaise 11/20/2021   History of pyelonephritis 11/20/2021   Alcohol abuse, in remission 02/26/2022   Left forearm pain 02/26/2022   Alcohol withdrawal hallucinosis (HCC) 02/26/2022   Abnormal urine odor 02/26/2022   Frequent falls 02/26/2022   Memory changes 02/26/2022   Grief at loss of child 06/25/2022   Adjustment disorder with mixed anxiety and depressed mood 06/25/2022   PTSD (post-traumatic stress disorder) 09/27/2022   Resolved Ambulatory Problems    Diagnosis Date Noted   Other change of lacrimal passages 03/01/2010   URI 08/17/2008   STONE, URINARY CALCULUS,UNSPEC. 12/29/2008   FOOT PAIN, LEFT 04/14/2010   Diarrhea 11/04/2009   CONTUSION, LEFT HAND 05/10/2009   CHEMICAL BURN 04/14/2010  Nephrolithiasis 11/06/2010   Bacterial folliculitis 01/09/2011   Acute pharyngitis 01/30/2011   BRONCHITIS, ACUTE 02/04/2011   ABDOMINAL PAIN, ACUTE 02/04/2011   Past Medical History:  Diagnosis Date   Anxiety    Cancer (HCC)    Colitis    Dysrhythmia    Gastric ulcer    Headache(784.0)    Heart murmur    IBS (irritable  bowel syndrome)    Interstitial cystitis    MVP (mitral valve prolapse)    Urinary calculus or stone      ROS See HPI.    Objective:     BP (!) 147/88   Pulse 94   Ht 5\' 6"  (1.676 m)   Wt 170 lb (77.1 kg)   SpO2 99%   BMI 27.44 kg/m  BP Readings from Last 3 Encounters:  10/25/22 (!) 147/88  09/27/22 136/79  02/26/22 (!) 149/96   Wt Readings from Last 3 Encounters:  10/25/22 170 lb (77.1 kg)  02/26/22 143 lb (64.9 kg)  12/02/21 156 lb (70.8 kg)    ..    10/25/2022    1:03 PM 10/25/2022   10:52 AM 09/27/2022   11:34 AM 03/24/2022   10:47 AM 08/17/2021   10:42 AM  Depression screen PHQ 2/9  Decreased Interest 1 1 3  0 1  Down, Depressed, Hopeless 2 1 3 1 2   PHQ - 2 Score 3 2 6 1 3   Altered sleeping 0  3  3  Tired, decreased energy 1  3  1   Change in appetite 0  2  0  Feeling bad or failure about yourself  1  1  1   Trouble concentrating 0  3  0  Moving slowly or fidgety/restless 0  0  0  Suicidal thoughts 0  0  0  PHQ-9 Score 5  18  8   Difficult doing work/chores Somewhat difficult  Very difficult  Somewhat difficult   .[name]    10/25/2022    1:03 PM 09/27/2022   11:35 AM 08/17/2021   10:45 AM 08/29/2020   11:18 AM  GAD 7 : Generalized Anxiety Score  Nervous, Anxious, on Edge 0 3 1 2   Control/stop worrying 0 3 1 2   Worry too much - different things 0 2 1 2   Trouble relaxing 0 2 1 1   Restless 0 1 0 1  Easily annoyed or irritable 1 1 3 2   Afraid - awful might happen 1 3 1 2   Total GAD 7 Score 2 15 8 12   Anxiety Difficulty Somewhat difficult Extremely difficult Somewhat difficult Somewhat difficult      Physical Exam Constitutional:      Appearance: Normal appearance.  HENT:     Head: Normocephalic.  Cardiovascular:     Rate and Rhythm: Normal rate.  Pulmonary:     Effort: Pulmonary effort is normal.  Neurological:     General: No focal deficit present.     Mental Status: She is alert and oriented to person, place, and time.  Psychiatric:        Mood  and Affect: Mood normal.       The 10-year ASCVD risk score (Arnett DK, et al., 2019) is: 7.8%    Assessment & Plan:  [name] KitchenMarland KitchenNakayah was seen today for follow-up.  Diagnoses and all orders for this visit:  Grief at loss of child  Adjustment disorder with mixed anxiety and depressed mood  PTSD (post-traumatic stress disorder)   PHQ/GAD much better, huge improvement.  Continue on same dose  of cymbalta and prozac. Discussed need for stablization for next 6 months to 1 year.  Follow up in 6 months   Return in about 6 months (around 04/27/2023).    Tandy Gaw, PA-C

## 2022-10-26 DIAGNOSIS — R339 Retention of urine, unspecified: Secondary | ICD-10-CM | POA: Diagnosis not present

## 2022-11-01 DIAGNOSIS — I10 Essential (primary) hypertension: Secondary | ICD-10-CM | POA: Diagnosis not present

## 2022-11-01 DIAGNOSIS — Z79891 Long term (current) use of opiate analgesic: Secondary | ICD-10-CM | POA: Diagnosis not present

## 2022-11-01 DIAGNOSIS — R102 Pelvic and perineal pain: Secondary | ICD-10-CM | POA: Diagnosis not present

## 2022-11-01 DIAGNOSIS — G8929 Other chronic pain: Secondary | ICD-10-CM | POA: Diagnosis not present

## 2022-11-01 DIAGNOSIS — F1120 Opioid dependence, uncomplicated: Secondary | ICD-10-CM | POA: Diagnosis not present

## 2022-11-01 DIAGNOSIS — Z6827 Body mass index (BMI) 27.0-27.9, adult: Secondary | ICD-10-CM | POA: Diagnosis not present

## 2022-11-01 DIAGNOSIS — N3010 Interstitial cystitis (chronic) without hematuria: Secondary | ICD-10-CM | POA: Diagnosis not present

## 2022-11-01 DIAGNOSIS — Z79899 Other long term (current) drug therapy: Secondary | ICD-10-CM | POA: Diagnosis not present

## 2022-11-04 DIAGNOSIS — Z79899 Other long term (current) drug therapy: Secondary | ICD-10-CM | POA: Diagnosis not present

## 2022-11-09 ENCOUNTER — Encounter: Payer: Self-pay | Admitting: Physician Assistant

## 2022-11-10 NOTE — Telephone Encounter (Signed)
Yes needs appt. Ok to put on nurse schdule

## 2022-11-11 NOTE — Telephone Encounter (Signed)
Attempted call to patient. Left voice mail message requesting a return call.  

## 2022-11-15 DIAGNOSIS — N3010 Interstitial cystitis (chronic) without hematuria: Secondary | ICD-10-CM | POA: Diagnosis not present

## 2022-11-15 DIAGNOSIS — N281 Cyst of kidney, acquired: Secondary | ICD-10-CM | POA: Diagnosis not present

## 2022-11-15 DIAGNOSIS — N200 Calculus of kidney: Secondary | ICD-10-CM | POA: Diagnosis not present

## 2022-11-15 DIAGNOSIS — M797 Fibromyalgia: Secondary | ICD-10-CM | POA: Diagnosis not present

## 2022-11-15 DIAGNOSIS — R102 Pelvic and perineal pain: Secondary | ICD-10-CM | POA: Diagnosis not present

## 2022-11-16 DIAGNOSIS — H524 Presbyopia: Secondary | ICD-10-CM | POA: Diagnosis not present

## 2022-11-16 DIAGNOSIS — H52209 Unspecified astigmatism, unspecified eye: Secondary | ICD-10-CM | POA: Diagnosis not present

## 2022-11-16 DIAGNOSIS — H5213 Myopia, bilateral: Secondary | ICD-10-CM | POA: Diagnosis not present

## 2022-11-16 DIAGNOSIS — H11159 Pinguecula, unspecified eye: Secondary | ICD-10-CM | POA: Diagnosis not present

## 2022-11-19 NOTE — Telephone Encounter (Signed)
Attempted call again to patient to inquire if symptoms have resolved or appt still needed. Left a voice mail message requesting a return call.

## 2022-11-20 ENCOUNTER — Other Ambulatory Visit: Payer: Self-pay | Admitting: Physician Assistant

## 2022-11-20 DIAGNOSIS — R110 Nausea: Secondary | ICD-10-CM

## 2022-11-24 NOTE — Telephone Encounter (Signed)
Seen at urology for cystitis

## 2022-11-25 NOTE — Telephone Encounter (Signed)
Prescription refill was sent on 11/23/22 .  She will call [facility] to check on this.

## 2022-11-26 DIAGNOSIS — R339 Retention of urine, unspecified: Secondary | ICD-10-CM | POA: Diagnosis not present

## 2022-12-02 DIAGNOSIS — Z79899 Other long term (current) drug therapy: Secondary | ICD-10-CM | POA: Diagnosis not present

## 2022-12-02 DIAGNOSIS — Z79891 Long term (current) use of opiate analgesic: Secondary | ICD-10-CM | POA: Diagnosis not present

## 2022-12-02 DIAGNOSIS — G8929 Other chronic pain: Secondary | ICD-10-CM | POA: Diagnosis not present

## 2022-12-02 DIAGNOSIS — Z6827 Body mass index (BMI) 27.0-27.9, adult: Secondary | ICD-10-CM | POA: Diagnosis not present

## 2022-12-02 DIAGNOSIS — R102 Pelvic and perineal pain: Secondary | ICD-10-CM | POA: Diagnosis not present

## 2022-12-02 DIAGNOSIS — F1120 Opioid dependence, uncomplicated: Secondary | ICD-10-CM | POA: Diagnosis not present

## 2022-12-13 ENCOUNTER — Other Ambulatory Visit: Payer: Self-pay | Admitting: Physician Assistant

## 2022-12-13 DIAGNOSIS — R110 Nausea: Secondary | ICD-10-CM

## 2022-12-23 DIAGNOSIS — Z79899 Other long term (current) drug therapy: Secondary | ICD-10-CM | POA: Diagnosis not present

## 2022-12-23 DIAGNOSIS — R102 Pelvic and perineal pain: Secondary | ICD-10-CM | POA: Diagnosis not present

## 2022-12-23 DIAGNOSIS — Z6828 Body mass index (BMI) 28.0-28.9, adult: Secondary | ICD-10-CM | POA: Diagnosis not present

## 2022-12-23 DIAGNOSIS — G8929 Other chronic pain: Secondary | ICD-10-CM | POA: Diagnosis not present

## 2022-12-23 DIAGNOSIS — Z79891 Long term (current) use of opiate analgesic: Secondary | ICD-10-CM | POA: Diagnosis not present

## 2022-12-23 DIAGNOSIS — F1120 Opioid dependence, uncomplicated: Secondary | ICD-10-CM | POA: Diagnosis not present

## 2022-12-28 DIAGNOSIS — R339 Retention of urine, unspecified: Secondary | ICD-10-CM | POA: Diagnosis not present

## 2023-01-04 ENCOUNTER — Other Ambulatory Visit: Payer: Self-pay | Admitting: Physician Assistant

## 2023-01-04 DIAGNOSIS — R110 Nausea: Secondary | ICD-10-CM

## 2023-01-04 DIAGNOSIS — F4321 Adjustment disorder with depressed mood: Secondary | ICD-10-CM

## 2023-01-04 MED ORDER — PROMETHAZINE HCL 25 MG PO TABS
ORAL_TABLET | ORAL | 0 refills | Status: DC
Start: 2023-01-04 — End: 2023-02-18

## 2023-01-04 MED ORDER — FLUOXETINE HCL 10 MG PO CAPS
10.0000 mg | ORAL_CAPSULE | Freq: Every day | ORAL | 0 refills | Status: DC
Start: 2023-01-04 — End: 2023-02-18

## 2023-01-24 DIAGNOSIS — R102 Pelvic and perineal pain: Secondary | ICD-10-CM | POA: Diagnosis not present

## 2023-01-24 DIAGNOSIS — G8929 Other chronic pain: Secondary | ICD-10-CM | POA: Diagnosis not present

## 2023-01-24 DIAGNOSIS — Z79899 Other long term (current) drug therapy: Secondary | ICD-10-CM | POA: Diagnosis not present

## 2023-01-24 DIAGNOSIS — F1120 Opioid dependence, uncomplicated: Secondary | ICD-10-CM | POA: Diagnosis not present

## 2023-01-24 DIAGNOSIS — N3010 Interstitial cystitis (chronic) without hematuria: Secondary | ICD-10-CM | POA: Diagnosis not present

## 2023-01-24 DIAGNOSIS — Z6828 Body mass index (BMI) 28.0-28.9, adult: Secondary | ICD-10-CM | POA: Diagnosis not present

## 2023-01-27 DIAGNOSIS — Z79899 Other long term (current) drug therapy: Secondary | ICD-10-CM | POA: Diagnosis not present

## 2023-01-27 DIAGNOSIS — R339 Retention of urine, unspecified: Secondary | ICD-10-CM | POA: Diagnosis not present

## 2023-02-18 ENCOUNTER — Encounter: Payer: Self-pay | Admitting: Physician Assistant

## 2023-02-18 ENCOUNTER — Ambulatory Visit (INDEPENDENT_AMBULATORY_CARE_PROVIDER_SITE_OTHER): Payer: Medicare HMO | Admitting: Physician Assistant

## 2023-02-18 VITALS — BP 126/84 | HR 92 | Temp 98.4°F | Ht 66.0 in | Wt 175.0 lb

## 2023-02-18 DIAGNOSIS — Z13220 Encounter for screening for lipoid disorders: Secondary | ICD-10-CM

## 2023-02-18 DIAGNOSIS — N3011 Interstitial cystitis (chronic) with hematuria: Secondary | ICD-10-CM

## 2023-02-18 DIAGNOSIS — Z634 Disappearance and death of family member: Secondary | ICD-10-CM | POA: Diagnosis not present

## 2023-02-18 DIAGNOSIS — G894 Chronic pain syndrome: Secondary | ICD-10-CM | POA: Diagnosis not present

## 2023-02-18 DIAGNOSIS — R112 Nausea with vomiting, unspecified: Secondary | ICD-10-CM | POA: Diagnosis not present

## 2023-02-18 DIAGNOSIS — R197 Diarrhea, unspecified: Secondary | ICD-10-CM

## 2023-02-18 DIAGNOSIS — R110 Nausea: Secondary | ICD-10-CM

## 2023-02-18 DIAGNOSIS — R319 Hematuria, unspecified: Secondary | ICD-10-CM | POA: Diagnosis not present

## 2023-02-18 DIAGNOSIS — F4321 Adjustment disorder with depressed mood: Secondary | ICD-10-CM

## 2023-02-18 LAB — POCT URINALYSIS DIP (CLINITEK)
Bilirubin, UA: NEGATIVE
Glucose, UA: NEGATIVE mg/dL
Ketones, POC UA: NEGATIVE mg/dL
Nitrite, UA: NEGATIVE
POC PROTEIN,UA: NEGATIVE
Spec Grav, UA: 1.01 (ref 1.010–1.025)
Urobilinogen, UA: 0.2 U/dL
pH, UA: 5.5 (ref 5.0–8.0)

## 2023-02-18 MED ORDER — FLUOXETINE HCL 10 MG PO CAPS
10.0000 mg | ORAL_CAPSULE | Freq: Every day | ORAL | 1 refills | Status: DC
Start: 2023-02-18 — End: 2023-10-28

## 2023-02-18 MED ORDER — PROMETHAZINE HCL 25 MG PO TABS
ORAL_TABLET | ORAL | 1 refills | Status: DC
Start: 2023-02-18 — End: 2023-05-20

## 2023-02-18 MED ORDER — DULOXETINE HCL 30 MG PO CPEP
ORAL_CAPSULE | ORAL | 1 refills | Status: DC
Start: 2023-02-18 — End: 2023-07-04

## 2023-02-18 NOTE — Patient Instructions (Addendum)
Consider decreasing liver supplement frequency and take benefiber when dosing it

## 2023-02-18 NOTE — Progress Notes (Signed)
Acute Office Visit  Subjective:     Patient ID: [name], female    DOB: May 07, 1966, 57 y.o.   MRN: [id number]  Chief Complaint  Patient presents with   Diarrhea    X8 days. Last episode was at 530 AM she reports that the stool was watery. She also stated that she has had vomiting that was like "acid" denies any f/s/c    HPI Patient is in today for diarrhea that has been persistent for 8 days now. Last episode of diarrhea was this morning at 5:30 a.m. Patient describes the diarrhea as watery and yellow in color. Patient denies seeing any blood or mucous in stool. Patient has also been experiencing some associated vomiting that began 2 days ago. She describes the vomit as yellow in color and acidic, as it "burns her throat". Patient denies fever, sweats, chills, or abdominal pain. Patient denies any recent antibiotic use or diet changes. Patient does state that she has recently started taking a liquid supplement for liver health, containing turmeric, dandelion, ginger, and milk thistle. Patient believes the supplement has caused her diarrhea and vomiting. She decreased her dose from BID, every day to once a day, every three days. She does feel that the diarrhea is also associated with eating and illicit's that she has been eating one meal a day with diarrhea typically following it. Patient states that she tried Immodium for 3-4 days after onset of symptoms but found no relief with use.      ROS  See HPI.     Objective:    BP 126/84   Pulse 92   Temp 98.4 F (36.9 C)   Ht 5\' 6"  (1.676 m)   Wt 175 lb (79.4 kg)   SpO2 97%   BMI 28.25 kg/m  BP Readings from Last 3 Encounters:  02/18/23 126/84  10/25/22 (!) 147/88  09/27/22 136/79   Wt Readings from Last 3 Encounters:  02/18/23 175 lb (79.4 kg)  10/25/22 170 lb (77.1 kg)  02/26/22 143 lb (64.9 kg)   ..    02/18/2023   12:43 PM 10/25/2022    1:03 PM 10/25/2022   10:52 AM 09/27/2022   11:34 AM 03/24/2022   10:47 AM   Depression screen PHQ 2/9  Decreased Interest 2 1 1 3  0  Down, Depressed, Hopeless 2 2 1 3 1   PHQ - 2 Score 4 3 2 6 1   Altered sleeping 1 0  3   Tired, decreased energy 1 1  3    Change in appetite 1 0  2   Feeling bad or failure about yourself  1 1  1    Trouble concentrating 0 0  3   Moving slowly or fidgety/restless 0 0  0   Suicidal thoughts 1 0  0   PHQ-9 Score 9 5  18    Difficult doing work/chores Somewhat difficult Somewhat difficult  Very difficult    ..    02/18/2023   12:43 PM 10/25/2022    1:03 PM 09/27/2022   11:35 AM 08/17/2021   10:45 AM  GAD 7 : Generalized Anxiety Score  Nervous, Anxious, on Edge 0 0 3 1  Control/stop worrying 2 0 3 1  Worry too much - different things 1 0 2 1  Trouble relaxing 0 0 2 1  Restless 0 0 1 0  Easily annoyed or irritable 1 1 1 3   Afraid - awful might happen 0 1 3 1   Total GAD 7 Score 4  2 15 8   Anxiety Difficulty Somewhat difficult Somewhat difficult Extremely difficult Somewhat difficult        Physical Exam Constitutional:      Appearance: Normal appearance.  Cardiovascular:     Rate and Rhythm: Normal rate.  Pulmonary:     Effort: Pulmonary effort is normal.  Abdominal:     Comments: Pt declined abdominal exam due to persistent IC pain.   Musculoskeletal:     Right lower leg: No edema.     Left lower leg: No edema.  Neurological:     General: No focal deficit present.     Mental Status: She is alert and oriented to person, place, and time.  Psychiatric:        Mood and Affect: Mood normal.      Results for orders placed or performed in visit on 02/18/23  POCT URINALYSIS DIP (CLINITEK)  Result Value Ref Range   Color, UA yellow yellow   Clarity, UA clear clear   Glucose, UA negative negative mg/dL   Bilirubin, UA negative negative   Ketones, POC UA negative negative mg/dL   Spec Grav, UA 1.610 9.604 - 1.025   Blood, UA small (A) negative   pH, UA 5.5 5.0 - 8.0   POC PROTEIN,UA negative negative, trace    Urobilinogen, UA 0.2 0.2 or 1.0 E.U./dL   Nitrite, UA Negative Negative   Leukocytes, UA Small (1+) (A) Negative        Assessment & Plan:  [name] KitchenMarland KitchenKazuko was seen today for diarrhea.  Diagnoses and all orders for this visit:  Diarrhea, unspecified type -     POCT URINALYSIS DIP (CLINITEK) -     CMP14+EGFR -     CBC w/Diff/Platelet -     TSH  Hematuria, unspecified type -     Urine Culture  Nausea -     promethazine (PHENERGAN) 25 MG tablet; TAKE 1 TABLET DAILY AS     NEEDED FOR NAUSEA  Grief at loss of child -     FLUoxetine (PROZAC) 10 MG capsule; Take 1 capsule (10 mg total) by mouth daily.  Screening for lipid disorders -     Lipid panel  Nausea and vomiting, unspecified vomiting type -     CMP14+EGFR -     CBC w/Diff/Platelet  Interstitial cystitis (chronic) with hematuria -     DULoxetine (CYMBALTA) 30 MG capsule; TAKE THREE CAPSULES BY MOUTH EVERYDAY  Chronic pain syndrome -     DULoxetine (CYMBALTA) 30 MG capsule; TAKE THREE CAPSULES BY MOUTH EVERYDAY   Pt needs fasting labs today for overall follow up I do think supplement is causing diarrhea Would like for her to cut way back or stop and only take with food and benefiber.  CBC and CMP ordered to evaluate for infection, check liver function and electrolytes.  Phenergan refilled UA ordered but no overt signs of infection Will culture and treat accordingly  PHQ/GAD not to goal Pt would like refill on prozac and cymbalta Pt continues to grieve over her sons sudden dealth   Return in about 4 weeks (around 03/18/2023), or if symptoms worsen or fail to improve.  Tandy Gaw, PA-C

## 2023-02-19 LAB — CBC WITH DIFFERENTIAL/PLATELET
Basophils Absolute: 0 10*3/uL (ref 0.0–0.2)
Basos: 0 %
EOS (ABSOLUTE): 0.1 10*3/uL (ref 0.0–0.4)
Eos: 2 %
Hematocrit: 48.9 % — ABNORMAL HIGH (ref 34.0–46.6)
Hemoglobin: 16 g/dL — ABNORMAL HIGH (ref 11.1–15.9)
Immature Grans (Abs): 0 10*3/uL (ref 0.0–0.1)
Immature Granulocytes: 0 %
Lymphocytes Absolute: 1.2 10*3/uL (ref 0.7–3.1)
Lymphs: 16 %
MCH: 30.8 pg (ref 26.6–33.0)
MCHC: 32.7 g/dL (ref 31.5–35.7)
MCV: 94 fL (ref 79–97)
Monocytes Absolute: 0.8 10*3/uL (ref 0.1–0.9)
Monocytes: 10 %
Neutrophils Absolute: 5.5 10*3/uL (ref 1.4–7.0)
Neutrophils: 72 %
Platelets: 359 10*3/uL (ref 150–450)
RBC: 5.2 x10E6/uL (ref 3.77–5.28)
RDW: 12 % (ref 11.7–15.4)
WBC: 7.6 10*3/uL (ref 3.4–10.8)

## 2023-02-19 LAB — CMP14+EGFR
ALT: 48 IU/L — ABNORMAL HIGH (ref 0–32)
AST: 50 IU/L — ABNORMAL HIGH (ref 0–40)
Albumin: 4.5 g/dL (ref 3.8–4.9)
Alkaline Phosphatase: 111 IU/L (ref 44–121)
BUN/Creatinine Ratio: 13 (ref 9–23)
BUN: 16 mg/dL (ref 6–24)
Bilirubin Total: 0.3 mg/dL (ref 0.0–1.2)
CO2: 22 mmol/L (ref 20–29)
Calcium: 10.1 mg/dL (ref 8.7–10.2)
Chloride: 96 mmol/L (ref 96–106)
Creatinine, Ser: 1.2 mg/dL — ABNORMAL HIGH (ref 0.57–1.00)
Globulin, Total: 3 g/dL (ref 1.5–4.5)
Glucose: 86 mg/dL (ref 70–99)
Potassium: 4.9 mmol/L (ref 3.5–5.2)
Sodium: 135 mmol/L (ref 134–144)
Total Protein: 7.5 g/dL (ref 6.0–8.5)
eGFR: 52 mL/min/{1.73_m2} — ABNORMAL LOW (ref >59)

## 2023-02-19 LAB — LIPID PANEL
Chol/HDL Ratio: 2.7 ratio (ref 0.0–4.4)
Cholesterol, Total: 191 mg/dL (ref 100–199)
HDL: 72 mg/dL (ref >39)
LDL Chol Calc (NIH): 102 mg/dL — ABNORMAL HIGH (ref 0–99)
Triglycerides: 95 mg/dL (ref 0–149)
VLDL Cholesterol Cal: 17 mg/dL (ref 5–40)

## 2023-02-19 LAB — TSH: TSH: 1.63 u[IU]/mL (ref 0.450–4.500)

## 2023-02-22 ENCOUNTER — Encounter: Payer: Self-pay | Admitting: Physician Assistant

## 2023-02-22 ENCOUNTER — Other Ambulatory Visit: Payer: Self-pay | Admitting: Physician Assistant

## 2023-02-22 MED ORDER — CIPROFLOXACIN HCL 500 MG PO TABS: 500.0000 mg | ORAL_TABLET | Freq: Two times a day (BID) | ORAL | 0 refills | Status: DC

## 2023-02-22 NOTE — Progress Notes (Signed)
Salmonella in urine culture likely from diarrhea. Will treat with cipro.  How are you feeling?   GFR dropped but like due to diarrhea. Hydrate and recheck in 2-3 weeks.   Cholesterol and thyroid look great.   Liver enzymes did improve.

## 2023-02-24 DIAGNOSIS — R102 Pelvic and perineal pain: Secondary | ICD-10-CM | POA: Diagnosis not present

## 2023-02-24 DIAGNOSIS — N3010 Interstitial cystitis (chronic) without hematuria: Secondary | ICD-10-CM | POA: Diagnosis not present

## 2023-02-24 DIAGNOSIS — F1120 Opioid dependence, uncomplicated: Secondary | ICD-10-CM | POA: Diagnosis not present

## 2023-02-24 DIAGNOSIS — Z79899 Other long term (current) drug therapy: Secondary | ICD-10-CM | POA: Diagnosis not present

## 2023-02-24 DIAGNOSIS — Z6828 Body mass index (BMI) 28.0-28.9, adult: Secondary | ICD-10-CM | POA: Diagnosis not present

## 2023-02-24 DIAGNOSIS — G8929 Other chronic pain: Secondary | ICD-10-CM | POA: Diagnosis not present

## 2023-02-28 DIAGNOSIS — R339 Retention of urine, unspecified: Secondary | ICD-10-CM | POA: Diagnosis not present

## 2023-03-02 LAB — URINE CULTURE

## 2023-03-02 LAB — SPECIMEN STATUS REPORT

## 2023-03-02 NOTE — Progress Notes (Signed)
Cipro is sensitive and will treat.

## 2023-03-03 DIAGNOSIS — Z79899 Other long term (current) drug therapy: Secondary | ICD-10-CM | POA: Diagnosis not present

## 2023-03-16 ENCOUNTER — Ambulatory Visit: Payer: Medicare HMO | Admitting: Physician Assistant

## 2023-03-18 ENCOUNTER — Other Ambulatory Visit: Payer: Self-pay | Admitting: Physician Assistant

## 2023-03-18 DIAGNOSIS — G2581 Restless legs syndrome: Secondary | ICD-10-CM

## 2023-03-30 DIAGNOSIS — Z79891 Long term (current) use of opiate analgesic: Secondary | ICD-10-CM | POA: Diagnosis not present

## 2023-03-30 DIAGNOSIS — N3010 Interstitial cystitis (chronic) without hematuria: Secondary | ICD-10-CM | POA: Diagnosis not present

## 2023-03-30 DIAGNOSIS — F1120 Opioid dependence, uncomplicated: Secondary | ICD-10-CM | POA: Diagnosis not present

## 2023-03-30 DIAGNOSIS — Z79899 Other long term (current) drug therapy: Secondary | ICD-10-CM | POA: Diagnosis not present

## 2023-03-30 DIAGNOSIS — Z6828 Body mass index (BMI) 28.0-28.9, adult: Secondary | ICD-10-CM | POA: Diagnosis not present

## 2023-03-30 DIAGNOSIS — R102 Pelvic and perineal pain: Secondary | ICD-10-CM | POA: Diagnosis not present

## 2023-03-30 DIAGNOSIS — G8929 Other chronic pain: Secondary | ICD-10-CM | POA: Diagnosis not present

## 2023-04-01 DIAGNOSIS — Z79899 Other long term (current) drug therapy: Secondary | ICD-10-CM | POA: Diagnosis not present

## 2023-04-15 LAB — STATE LABORATORY REPORT

## 2023-04-27 ENCOUNTER — Ambulatory Visit: Payer: Medicare HMO | Admitting: Physician Assistant

## 2023-04-29 ENCOUNTER — Telehealth: Payer: Self-pay | Admitting: Physician Assistant

## 2023-04-29 ENCOUNTER — Ambulatory Visit
Admission: EM | Admit: 2023-04-29 | Discharge: 2023-04-29 | Disposition: A | Discharge status: Home / Self Care | Payer: Medicare HMO | Attending: Family Medicine | Admitting: Family Medicine

## 2023-04-29 ENCOUNTER — Encounter: Payer: Self-pay | Admitting: Physician Assistant

## 2023-04-29 ENCOUNTER — Ambulatory Visit (INDEPENDENT_AMBULATORY_CARE_PROVIDER_SITE_OTHER): Payer: Medicare HMO | Admitting: Physician Assistant

## 2023-04-29 VITALS — BP 126/83 | HR 85 | Temp 98.7°F | Ht 66.0 in | Wt 176.8 lb

## 2023-04-29 DIAGNOSIS — R1084 Generalized abdominal pain: Secondary | ICD-10-CM

## 2023-04-29 DIAGNOSIS — N3011 Interstitial cystitis (chronic) with hematuria: Secondary | ICD-10-CM

## 2023-04-29 DIAGNOSIS — Z87442 Personal history of urinary calculi: Secondary | ICD-10-CM | POA: Insufficient documentation

## 2023-04-29 DIAGNOSIS — R319 Hematuria, unspecified: Secondary | ICD-10-CM | POA: Diagnosis not present

## 2023-04-29 DIAGNOSIS — R109 Unspecified abdominal pain: Secondary | ICD-10-CM

## 2023-04-29 LAB — POCT URINALYSIS DIP (MANUAL ENTRY)
Bilirubin, UA: NEGATIVE
Glucose, UA: NEGATIVE mg/dL
Leukocytes, UA: NEGATIVE
Nitrite, UA: NEGATIVE
Protein Ur, POC: NEGATIVE mg/dL
Spec Grav, UA: 1.015 (ref 1.010–1.025)
Urobilinogen, UA: 0.2 U/dL
pH, UA: 5.5 (ref 5.0–8.0)

## 2023-04-29 MED ORDER — KETOROLAC TROMETHAMINE 60 MG/2ML IM SOLN
60.0000 mg | Freq: Once | INTRAMUSCULAR | Status: AC
  Administered 2023-04-29: 60 mg via INTRAMUSCULAR

## 2023-04-29 NOTE — Telephone Encounter (Signed)
Patient's husband came in stating the patient was suggested to go to the ER by urgent care. Patient does not want to go and instead would like to have Jade put in CT or MRI of the abdominal region. Please advise.

## 2023-04-29 NOTE — ED Triage Notes (Addendum)
Pt here today with husband c/o abd pain x 2 weeks. Hx of bladder infections.

## 2023-04-29 NOTE — Discharge Instructions (Addendum)
Advised patient/husband please go to [facility] ED NOW for further evaluation of abdominal pain.

## 2023-04-29 NOTE — Progress Notes (Signed)
Acute Office Visit  Subjective:     Patient ID: [name], female    DOB: Jul 31, 1965, 57 y.o.   MRN: [id number]  Chief Complaint  Patient presents with   Abdominal Pain    X 10 days    HPI Patient is in today for abdominal pain for 10 days. She has hx of chronic abdominal pain from Interstitial cystitis but this is more severe. She has no appetite. She is not having regular bowel movement and last BM was 3 days ago. She denies any melena or hematochezia. No increase in urinary frequency. She is still cathed once a day by her husband but been going more and more on her own. She denies and fever or body aches. She is having nausea and some vomiting. She is staying hydrated. She has chronic pain medications already.   Rates pain 10/10. She has hx of kidney stones but no flank pain. She did go to UC earlier and UA was positive for blood.   .. Active Ambulatory Problems    Diagnosis Date Noted   TOBACCO ABUSE 05/28/2009   Mitral valve disease 05/28/2009   Cardiac dysrhythmia 05/16/2009   TMJ PAIN 07/26/2008   Gastric ulcer 05/26/2009   NEPHROLITHIASIS 03/06/2010   HEMATURIA UNSPECIFIED 12/19/2008   OVARIAN CYST 03/06/2010   PALPITATIONS 05/16/2009   Diarrhea 11/04/2009   History of cardiovascular disorder 05/26/2009   Depressive disorder, not elsewhere classified 11/06/2010   Lymphocytic colitis 12/01/2011   EBV infection 04/21/2012   Chronic pain syndrome 06/21/2012   Fibromyalgia 06/21/2012   Carpal tunnel syndrome, bilateral 06/21/2012   Degenerative disc disease, cervical 06/21/2012   Facet arthropathy, cervical 06/21/2012   DDD (degenerative disc disease), lumbosacral 06/21/2012   Lower abdominal pain 07/26/2012   Interstitial cystitis (chronic) with hematuria 04/13/2013   Primary osteoarthritis of right knee 12/20/2013   Incontinence in female 06/26/2014   Acute bilateral lower abdominal pain 06/26/2014   Pain in joint, ankle and foot 04/21/2015   Status post  implantation of urinary electronic stimulator device 05/05/2015   Post-menopausal 06/20/2015   Family history of cardiovascular disease 06/20/2015   Drug induced constipation 06/20/2015   No energy 06/20/2015   Dysuria 06/20/2015   Hiatal hernia 07/16/2015   Depression 09/03/2015   Primary osteoarthritis of left hand 02/27/2016   Breast pain, right 09/14/2016   Abnormal weight gain 03/16/2017   BMI 27.0-27.9,adult 03/16/2017   History of gastric ulcer 04/09/2019   RLS (restless legs syndrome) 04/09/2019   Mild episode of recurrent major depressive disorder (HCC) 03/17/2021   Stuttering 03/17/2021   Elevated LDL cholesterol level 03/17/2021   Word finding difficulty 03/17/2021   Sleep talking 03/17/2021   Abnormal MRI of head 05/19/2021   Chronic, continuous use of opioids 09/28/2013   Smoker 05/28/2009   Hyponatremia 06/10/2021   Anorexia 06/10/2021   Adult failure to thrive 06/10/2021   Acute lower UTI 06/10/2021   Elevated lipase 06/17/2021   Fatty liver 06/17/2021   Common bile duct dilatation 06/17/2021   Hypotension 11/20/2021   Malaise 11/20/2021   History of pyelonephritis 11/20/2021   Alcohol abuse, in remission 02/26/2022   Left forearm pain 02/26/2022   Alcohol withdrawal hallucinosis (HCC) 02/26/2022   Abnormal urine odor 02/26/2022   Frequent falls 02/26/2022   Memory changes 02/26/2022   Grief at loss of child 06/25/2022   Adjustment disorder with mixed anxiety and depressed mood 06/25/2022   PTSD (post-traumatic stress disorder) 09/27/2022   Nausea 02/18/2023   History  of kidney stones 04/29/2023   Resolved Ambulatory Problems    Diagnosis Date Noted   Other changes of lacrimal passages 03/01/2010   Acute upper respiratory infection 08/17/2008   STONE, URINARY CALCULUS,UNSPEC. 12/29/2008   FOOT PAIN, LEFT 04/14/2010   Contusion of hand 05/10/2009   Burn 04/14/2010   Nephrolithiasis 11/06/2010   Bacterial folliculitis 01/09/2011   Acute pharyngitis  01/30/2011   BRONCHITIS, ACUTE 02/04/2011   Abdominal pain 02/04/2011   Past Medical History:  Diagnosis Date   Anxiety    Cancer (HCC)    Colitis    Dysrhythmia    Gastric ulcer    Headache(784.0)    Heart murmur    IBS (irritable bowel syndrome)    Interstitial cystitis    MVP (mitral valve prolapse)    Urinary calculus or stone      ROS See HPI.      Objective:    BP 126/83 (BP Location: Left Arm, Patient Position: Sitting, Cuff Size: Normal)   Pulse 85   Ht 5\' 6"  (1.676 m)   Wt 176 lb 12 oz (80.2 kg)   SpO2 95%   BMI 28.53 kg/m  BP Readings from Last 3 Encounters:  04/29/23 126/83  04/29/23 125/85  02/18/23 126/84   Wt Readings from Last 3 Encounters:  04/29/23 176 lb 12 oz (80.2 kg)  02/18/23 175 lb (79.4 kg)  10/25/22 170 lb (77.1 kg)      Physical Exam Constitutional:      Appearance: She is well-developed.  Abdominal:     General: There is distension.     Tenderness: There is generalized abdominal tenderness. There is no right CVA tenderness or left CVA tenderness.     Comments: Pt would not allow me to touch her abdomen due to pain.   Neurological:     General: No focal deficit present.     Mental Status: She is alert.  Psychiatric:     Comments: In pain     Results for orders placed or performed during the hospital encounter of 04/29/23  POCT urinalysis dipstick (new)  Result Value Ref Range   Color, UA yellow yellow   Clarity, UA clear clear   Glucose, UA negative negative mg/dL   Bilirubin, UA negative negative   Ketones, POC UA trace (5) (A) negative mg/dL   Spec Grav, UA 3.151 7.616 - 1.025   Blood, UA small (A) negative   pH, UA 5.5 5.0 - 8.0   Protein Ur, POC negative negative mg/dL   Urobilinogen, UA 0.2 0.2 or 1.0 E.U./dL   Nitrite, UA Negative Negative   Leukocytes, UA Negative Negative        Assessment & Plan:  [name] KitchenMarland KitchenPenney was seen today for abdominal pain.  Diagnoses and all orders for this visit:  Acute abdominal  pain -     CT ABDOMEN PELVIS W CONTRAST; Future -     ketorolac (TORADOL) injection 60 mg  History of kidney stones -     CT ABDOMEN PELVIS W CONTRAST; Future  Interstitial cystitis (chronic) with hematuria -     CT ABDOMEN PELVIS W CONTRAST; Future  Hematuria, unspecified type -     CT ABDOMEN PELVIS W CONTRAST; Future   CT STAT ordered to rule out diverticulitis vs nephrolithiasis vs partial bowel obstruction vs colitis.  Vitals reassuring Continue to hydrate and take pain medications Will call with CT results.   Tandy Gaw, PA-C

## 2023-04-29 NOTE — Telephone Encounter (Signed)
Left Vm for patient to call back to schedule appointment for this afternoon.

## 2023-04-29 NOTE — ED Notes (Signed)
Patient is being discharged from the Urgent Care and sent to the Emergency Department via POV by spouse. Per Trevor Iha FNP, patient is in need of higher level of care due to severe abd pain and need for advanced imaging. Patient is aware and verbalizes understanding of plan of care.  Vitals:   04/29/23 1030  BP: 125/85  Pulse: 85  Resp: 17  Temp: 97.7 F (36.5 C)  SpO2: 93%

## 2023-04-29 NOTE — ED Provider Notes (Signed)
[name]    CSN: [id number] Arrival date & time: 04/29/23  1023      History   Chief Complaint Chief Complaint  Patient presents with   Abdominal Pain    HPI [name] is a 57 y.o. female.   HPI 57 year old female presents with abdominal pain x 2 weeks.  Patient reports history of bladder infections.  PMH significant for cervical cancer, interstitial cystitis, colitis, gastric ulcer, and IBS.  Patient has considerable/significant medical history active problem list.  Please see below.  Patient evaluated recently by PCP for pelvic and perineal pain on 02/24/2023.  Patient is accompanied by her husband.  Husband asked me not to press on patient's/his wife's abdomen due to pain today.  Past Medical History:  Diagnosis Date   Anxiety    Cancer (HCC)    cervical   Colitis    lymphacitic   Depression    Dysrhythmia    pvc    Gastric ulcer    Headache(784.0)    Heart murmur    IBS (irritable bowel syndrome)    Interstitial cystitis    MVP (mitral valve prolapse)    Urinary calculus or stone     Patient Active Problem List   Diagnosis Date Noted   Nausea 02/18/2023   PTSD (post-traumatic stress disorder) 09/27/2022   Grief at loss of child 06/25/2022   Adjustment disorder with mixed anxiety and depressed mood 06/25/2022   Alcohol abuse, in remission 02/26/2022   Left forearm pain 02/26/2022   Alcohol withdrawal hallucinosis (HCC) 02/26/2022   Abnormal urine odor 02/26/2022   Frequent falls 02/26/2022   Memory changes 02/26/2022   Hypotension 11/20/2021   Malaise 11/20/2021   History of pyelonephritis 11/20/2021   Elevated lipase 06/17/2021   Fatty liver 06/17/2021   Common bile duct dilatation 06/17/2021   Hyponatremia 06/10/2021   Anorexia 06/10/2021   Adult failure to thrive 06/10/2021   Acute lower UTI 06/10/2021   Abnormal MRI of head 05/19/2021   Mild episode of recurrent major depressive disorder (HCC) 03/17/2021   Stuttering  03/17/2021   Elevated LDL cholesterol level 03/17/2021   Word finding difficulty 03/17/2021   Sleep talking 03/17/2021   History of gastric ulcer 04/09/2019   RLS (restless legs syndrome) 04/09/2019   Abnormal weight gain 03/16/2017   BMI 27.0-27.9,adult 03/16/2017   Breast pain, right 09/14/2016   Primary osteoarthritis of left hand 02/27/2016   Depression 09/03/2015   Hiatal hernia 07/16/2015   Post-menopausal 06/20/2015   Family history of cardiovascular disease 06/20/2015   Drug induced constipation 06/20/2015   No energy 06/20/2015   Dysuria 06/20/2015   Status post implantation of urinary electronic stimulator device 05/05/2015   Pain in joint, ankle and foot 04/21/2015   Incontinence in female 06/26/2014   Acute bilateral lower abdominal pain 06/26/2014   Primary osteoarthritis of right knee 12/20/2013   Chronic, continuous use of opioids 09/28/2013   Interstitial cystitis (chronic) with hematuria 04/13/2013   Lower abdominal pain 07/26/2012   Chronic pain syndrome 06/21/2012   Fibromyalgia 06/21/2012   Carpal tunnel syndrome, bilateral 06/21/2012   Degenerative disc disease, cervical 06/21/2012   Facet arthropathy, cervical 06/21/2012   DDD (degenerative disc disease), lumbosacral 06/21/2012   EBV infection 04/21/2012   Lymphocytic colitis 12/01/2011   Depressive disorder, not elsewhere classified 11/06/2010   NEPHROLITHIASIS 03/06/2010   OVARIAN CYST 03/06/2010   Diarrhea 11/04/2009   TOBACCO ABUSE 05/28/2009   Mitral valve disease 05/28/2009   Smoker 05/28/2009  Gastric ulcer 05/26/2009   History of cardiovascular disorder 05/26/2009   Cardiac dysrhythmia 05/16/2009   PALPITATIONS 05/16/2009   HEMATURIA UNSPECIFIED 12/19/2008   TMJ PAIN 07/26/2008    Past Surgical History:  Procedure Laterality Date   APPENDECTOMY  06/15/2003   BILATERAL OOPHORECTOMY  06/14/2010   DILATION AND CURETTAGE OF UTERUS     x 4  one after each SAB   EXPLORATORY LAPAROTOMY   06/14/2005   LAPAROSCOPY N/A 08/04/2012   Procedure: LAPAROSCOPY DIAGNOSTIC;  Surgeon: Atilano Ina, MD,FACS;  Location: [facility];  Service: General;  Laterality: N/A;   TONSILLECTOMY  30 years ago   TOTAL ABDOMINAL HYSTERECTOMY  06/15/2003   TUBAL LIGATION  06/14/1998    OB History     Gravida  5   Para  2   Term  2   Preterm      AB      Living  2      SAB      IAB      Ectopic      Multiple      Live Births               Home Medications    Prior to Admission medications   Medication Sig Start Date End Date Taking? Authorizing Provider  atenolol (TENORMIN) 50 MG tablet TAKE ONE-HALF TABLET BY MOUTH ONCE DAILY Patient taking differently: Take 50 mg by mouth daily. 05/24/16   Lewayne Bunting, MD  ciprofloxacin (CIPRO) 500 MG tablet Take 1 tablet (500 mg total) by mouth 2 (two) times daily. 02/22/23   Breeback, Lonna Cobb, PA-C  DULoxetine (CYMBALTA) 30 MG capsule TAKE THREE CAPSULES BY MOUTH EVERYDAY 02/18/23   Breeback, Jade L, PA-C  FLUoxetine (PROZAC) 10 MG capsule Take 1 capsule (10 mg total) by mouth daily. 02/18/23   Breeback, Jade L, PA-C  hydrOXYzine (ATARAX/VISTARIL) 25 MG tablet Take 25 mg by mouth 3 (three) times daily as needed for anxiety.    [provider]  lidocaine (XYLOCAINE) 2 % injection 10 mLs by Other route See admin instructions. Once to twice daily as needed for pain 11/06/20   [provider]  linaclotide (LINZESS) 290 MCG CAPS capsule Take 1 capsule (290 mcg total) by mouth daily before breakfast. Patient taking differently: Take 290 mcg by mouth daily as needed (constipation). 03/17/21   Breeback, Jade L, PA-C  morphine (MS CONTIN) 60 MG 12 hr tablet Take 60 mg by mouth every 12 (twelve) hours.    [provider]  nitrofurantoin (MACRODANTIN) 50 MG capsule Take 50 mg by mouth daily. 02/03/22   [provider]  pantoprazole (PROTONIX) 40 MG tablet TAKE 1 TABLET DAILY 08/23/22   Breeback, Jade L, PA-C   pramipexole (MIRAPEX) 0.125 MG tablet TAKE ONE TABLET BY MOUTH AT BEDTIME AS NEEDED FOR RESTLESS LEGS 03/22/23   Breeback, Jade L, PA-C  promethazine (PHENERGAN) 25 MG tablet TAKE 1 TABLET DAILY AS     NEEDED FOR NAUSEA 02/18/23   Jomarie Longs, PA-C    Family History Family History  Problem Relation Age of Onset   Heart attack Mother    Cirrhosis Mother    Cancer Father        tongue   Cirrhosis Father    Cancer Paternal Uncle    Colon cancer Neg Hx    Esophageal cancer Neg Hx    Rectal cancer Neg Hx    Stomach cancer Neg Hx    Breast cancer Neg  Hx     Social History Social History   Tobacco Use   Smoking status: Every Day    Current packs/day: 1.00    Average packs/day: 1 pack/day for 25.0 years (25.0 ttl pk-yrs)    Types: Cigarettes   Smokeless tobacco: Never   Tobacco comments:    03/24/22 Started smoking 0.5 pack per day since October, 2022.  Vaping Use   Vaping status: Never Used  Substance Use Topics   Alcohol use: Yes    Comment: rarely   Drug use: No     Allergies   Gabapentin, Lyrica [pregabalin], Ambien [zolpidem], Amitriptyline, and Bactrim [sulfamethoxazole-trimethoprim]   Review of Systems Review of Systems  Gastrointestinal:  Positive for abdominal pain.  All other systems reviewed and are negative.    Physical Exam Triage Vital Signs ED Triage Vitals  Encounter Vitals Group     BP 04/29/23 1030 125/85     Systolic BP Percentile --      Diastolic BP Percentile --      Pulse Rate 04/29/23 1030 85     Resp 04/29/23 1030 17     Temp 04/29/23 1030 97.7 F (36.5 C)     Temp Source 04/29/23 1030 Oral     SpO2 04/29/23 1030 93 %     Weight --      Height --      Head Circumference --      Peak Flow --      Pain Score 04/29/23 1031 0     Pain Loc --      Pain Education --      Exclude from Growth Chart --    No data found.  Updated Vital Signs BP 125/85 (BP Location: Left Arm)   Pulse 85   Temp 97.7 F (36.5 C) (Oral)   Resp 17    SpO2 93%    Physical Exam Vitals and nursing note reviewed.  Constitutional:      Appearance: She is well-developed. She is obese. She is ill-appearing.  HENT:     Head: Normocephalic and atraumatic.     Mouth/Throat:     Mouth: Mucous membranes are moist.     Pharynx: Oropharynx is clear.  Eyes:     Extraocular Movements: Extraocular movements intact.     Pupils: Pupils are equal, round, and reactive to light.  Cardiovascular:     Rate and Rhythm: Normal rate and regular rhythm.     Heart sounds: Normal heart sounds. No murmur heard. Pulmonary:     Effort: Pulmonary effort is normal.     Breath sounds: Normal breath sounds.  Abdominal:     General: There is distension.     Tenderness: There is generalized abdominal tenderness.  Skin:    General: Skin is warm and dry.  Neurological:     General: No focal deficit present.     Mental Status: She is alert and oriented to person, place, and time.  Psychiatric:        Mood and Affect: Mood normal.        Behavior: Behavior normal.      UC Treatments / Results  Labs (all labs ordered are listed, but only abnormal results are displayed) Labs Reviewed  POCT URINALYSIS DIP (MANUAL ENTRY) - Abnormal; Notable for the following components:      Result Value   Ketones, POC UA trace (5) (*)    Blood, UA small (*)    All other components within normal limits  EKG   Radiology No results found.  Procedures Procedures (including critical care time)  Medications Ordered in UC Medications - No data to display  Initial Impression / Assessment and Plan / UC Course  I have reviewed the triage vital signs and the nursing notes.  Pertinent labs & imaging results that were available during my care of the patient were reviewed by me and considered in my medical decision making (see chart for details).     MDM: 1.  Generalized abdominal pain-physical exam limited today due to husband/patient request.  Advised patient/husband  to go to [facility] now for further evaluation of current abdominal pain.  Patient reports that she would rather go home, husband reports will take patient to nearest ED for evaluation. Advised patient/husband please go to [facility] ED NOW for further evaluation of abdominal pain.  2.  Hematuria, unspecified type-UA revealed above.  Patient discharged to local ED, hemodynamically stable. Final Clinical Impressions(s) / UC Diagnoses   Final diagnoses:  Generalized abdominal pain  Hematuria, unspecified type     Discharge Instructions      Advised patient/husband please go to [facility] ED NOW for further evaluation of abdominal pain.     ED Prescriptions   None    PDMP not reviewed this encounter.   Trevor Iha, FNP 04/29/23 1151

## 2023-04-30 ENCOUNTER — Encounter (HOSPITAL_BASED_OUTPATIENT_CLINIC_OR_DEPARTMENT_OTHER): Payer: Self-pay

## 2023-04-30 ENCOUNTER — Ambulatory Visit (HOSPITAL_BASED_OUTPATIENT_CLINIC_OR_DEPARTMENT_OTHER)
Admission: RE | Admit: 2023-04-30 | Discharge: 2023-04-30 | Disposition: A | Discharge status: Home / Self Care | Payer: Medicare HMO | Source: Ambulatory Visit | Attending: Physician Assistant | Admitting: Physician Assistant

## 2023-04-30 DIAGNOSIS — R935 Abnormal findings on diagnostic imaging of other abdominal regions, including retroperitoneum: Secondary | ICD-10-CM | POA: Diagnosis not present

## 2023-04-30 DIAGNOSIS — Z87442 Personal history of urinary calculi: Secondary | ICD-10-CM | POA: Diagnosis not present

## 2023-04-30 DIAGNOSIS — R319 Hematuria, unspecified: Secondary | ICD-10-CM | POA: Diagnosis not present

## 2023-04-30 DIAGNOSIS — K56609 Unspecified intestinal obstruction, unspecified as to partial versus complete obstruction: Secondary | ICD-10-CM | POA: Diagnosis not present

## 2023-04-30 DIAGNOSIS — N3011 Interstitial cystitis (chronic) with hematuria: Secondary | ICD-10-CM

## 2023-04-30 DIAGNOSIS — R109 Unspecified abdominal pain: Secondary | ICD-10-CM

## 2023-04-30 MED ORDER — IOHEXOL 300 MG/ML  SOLN
75.0000 mL | Freq: Once | INTRAMUSCULAR | Status: AC | PRN
  Administered 2023-04-30: 75 mL via INTRAVENOUS

## 2023-04-30 MED ORDER — BARIUM SULFATE 2 % PO SUSP
450.0000 mL | Freq: Once | ORAL | Status: AC
  Administered 2023-04-30: 450 mL via ORAL

## 2023-05-01 DIAGNOSIS — R638 Other symptoms and signs concerning food and fluid intake: Secondary | ICD-10-CM | POA: Diagnosis not present

## 2023-05-01 DIAGNOSIS — Z931 Gastrostomy status: Secondary | ICD-10-CM | POA: Diagnosis not present

## 2023-05-01 DIAGNOSIS — F1010 Alcohol abuse, uncomplicated: Secondary | ICD-10-CM | POA: Diagnosis not present

## 2023-05-01 DIAGNOSIS — D72829 Elevated white blood cell count, unspecified: Secondary | ICD-10-CM | POA: Diagnosis not present

## 2023-05-01 DIAGNOSIS — N178 Other acute kidney failure: Secondary | ICD-10-CM | POA: Diagnosis not present

## 2023-05-01 DIAGNOSIS — F1020 Alcohol dependence, uncomplicated: Secondary | ICD-10-CM | POA: Diagnosis not present

## 2023-05-01 DIAGNOSIS — E872 Acidosis, unspecified: Secondary | ICD-10-CM | POA: Diagnosis not present

## 2023-05-01 DIAGNOSIS — D649 Anemia, unspecified: Secondary | ICD-10-CM | POA: Diagnosis not present

## 2023-05-01 DIAGNOSIS — I10 Essential (primary) hypertension: Secondary | ICD-10-CM | POA: Diagnosis not present

## 2023-05-01 DIAGNOSIS — K760 Fatty (change of) liver, not elsewhere classified: Secondary | ICD-10-CM | POA: Diagnosis not present

## 2023-05-01 DIAGNOSIS — K580 Irritable bowel syndrome with diarrhea: Secondary | ICD-10-CM | POA: Diagnosis not present

## 2023-05-01 DIAGNOSIS — F32A Depression, unspecified: Secondary | ICD-10-CM | POA: Diagnosis not present

## 2023-05-01 DIAGNOSIS — D72828 Other elevated white blood cell count: Secondary | ICD-10-CM | POA: Diagnosis not present

## 2023-05-01 DIAGNOSIS — G894 Chronic pain syndrome: Secondary | ICD-10-CM | POA: Diagnosis not present

## 2023-05-01 DIAGNOSIS — Z79899 Other long term (current) drug therapy: Secondary | ICD-10-CM | POA: Diagnosis not present

## 2023-05-01 DIAGNOSIS — Z792 Long term (current) use of antibiotics: Secondary | ICD-10-CM | POA: Diagnosis not present

## 2023-05-01 DIAGNOSIS — E875 Hyperkalemia: Secondary | ICD-10-CM | POA: Diagnosis not present

## 2023-05-01 DIAGNOSIS — K852 Alcohol induced acute pancreatitis without necrosis or infection: Secondary | ICD-10-CM | POA: Diagnosis not present

## 2023-05-01 DIAGNOSIS — Z720 Tobacco use: Secondary | ICD-10-CM | POA: Diagnosis not present

## 2023-05-01 DIAGNOSIS — Z9682 Presence of neurostimulator: Secondary | ICD-10-CM | POA: Diagnosis not present

## 2023-05-01 DIAGNOSIS — N3010 Interstitial cystitis (chronic) without hematuria: Secondary | ICD-10-CM | POA: Diagnosis not present

## 2023-05-01 DIAGNOSIS — Z789 Other specified health status: Secondary | ICD-10-CM | POA: Diagnosis not present

## 2023-05-01 DIAGNOSIS — Z91198 Patient's noncompliance with other medical treatment and regimen for other reason: Secondary | ICD-10-CM | POA: Diagnosis not present

## 2023-05-01 DIAGNOSIS — Z634 Disappearance and death of family member: Secondary | ICD-10-CM | POA: Diagnosis not present

## 2023-05-01 DIAGNOSIS — R918 Other nonspecific abnormal finding of lung field: Secondary | ICD-10-CM | POA: Diagnosis not present

## 2023-05-01 DIAGNOSIS — D759 Disease of blood and blood-forming organs, unspecified: Secondary | ICD-10-CM | POA: Diagnosis not present

## 2023-05-01 DIAGNOSIS — R112 Nausea with vomiting, unspecified: Secondary | ICD-10-CM | POA: Diagnosis not present

## 2023-05-01 DIAGNOSIS — F17210 Nicotine dependence, cigarettes, uncomplicated: Secondary | ICD-10-CM | POA: Diagnosis not present

## 2023-05-01 DIAGNOSIS — K6389 Other specified diseases of intestine: Secondary | ICD-10-CM | POA: Diagnosis not present

## 2023-05-01 DIAGNOSIS — K7010 Alcoholic hepatitis without ascites: Secondary | ICD-10-CM | POA: Diagnosis not present

## 2023-05-01 DIAGNOSIS — F1090 Alcohol use, unspecified, uncomplicated: Secondary | ICD-10-CM | POA: Diagnosis not present

## 2023-05-01 DIAGNOSIS — K700 Alcoholic fatty liver: Secondary | ICD-10-CM | POA: Diagnosis not present

## 2023-05-01 DIAGNOSIS — M549 Dorsalgia, unspecified: Secondary | ICD-10-CM | POA: Diagnosis not present

## 2023-05-01 DIAGNOSIS — D751 Secondary polycythemia: Secondary | ICD-10-CM | POA: Diagnosis not present

## 2023-05-01 DIAGNOSIS — K859 Acute pancreatitis without necrosis or infection, unspecified: Secondary | ICD-10-CM | POA: Diagnosis not present

## 2023-05-01 DIAGNOSIS — Z4659 Encounter for fitting and adjustment of other gastrointestinal appliance and device: Secondary | ICD-10-CM | POA: Diagnosis not present

## 2023-05-01 DIAGNOSIS — R188 Other ascites: Secondary | ICD-10-CM | POA: Diagnosis not present

## 2023-05-01 DIAGNOSIS — Z978 Presence of other specified devices: Secondary | ICD-10-CM | POA: Diagnosis not present

## 2023-05-01 DIAGNOSIS — R110 Nausea: Secondary | ICD-10-CM | POA: Diagnosis not present

## 2023-05-01 DIAGNOSIS — E860 Dehydration: Secondary | ICD-10-CM | POA: Diagnosis not present

## 2023-05-01 DIAGNOSIS — R109 Unspecified abdominal pain: Secondary | ICD-10-CM | POA: Diagnosis not present

## 2023-05-01 DIAGNOSIS — G8929 Other chronic pain: Secondary | ICD-10-CM | POA: Diagnosis not present

## 2023-05-02 DIAGNOSIS — G894 Chronic pain syndrome: Secondary | ICD-10-CM | POA: Diagnosis not present

## 2023-05-02 DIAGNOSIS — Z720 Tobacco use: Secondary | ICD-10-CM | POA: Diagnosis not present

## 2023-05-02 DIAGNOSIS — K852 Alcohol induced acute pancreatitis without necrosis or infection: Secondary | ICD-10-CM | POA: Diagnosis not present

## 2023-05-02 DIAGNOSIS — F1090 Alcohol use, unspecified, uncomplicated: Secondary | ICD-10-CM | POA: Diagnosis not present

## 2023-05-02 DIAGNOSIS — N3010 Interstitial cystitis (chronic) without hematuria: Secondary | ICD-10-CM | POA: Diagnosis not present

## 2023-05-02 DIAGNOSIS — F32A Depression, unspecified: Secondary | ICD-10-CM | POA: Diagnosis not present

## 2023-05-02 NOTE — Progress Notes (Signed)
No acute findings on CT to cause pain. How are you feeling today?   I think you need to get back in to your IC specialist. When is your next appt?

## 2023-05-03 ENCOUNTER — Telehealth: Payer: Self-pay | Admitting: Physician Assistant

## 2023-05-03 DIAGNOSIS — K852 Alcohol induced acute pancreatitis without necrosis or infection: Secondary | ICD-10-CM | POA: Diagnosis not present

## 2023-05-03 NOTE — Telephone Encounter (Signed)
Patients husband called in to inform that the patient was currently in the hospital at [facility] for pancreatitis.

## 2023-05-04 DIAGNOSIS — K852 Alcohol induced acute pancreatitis without necrosis or infection: Secondary | ICD-10-CM | POA: Diagnosis not present

## 2023-05-04 DIAGNOSIS — F32A Depression, unspecified: Secondary | ICD-10-CM | POA: Diagnosis not present

## 2023-05-04 DIAGNOSIS — G894 Chronic pain syndrome: Secondary | ICD-10-CM | POA: Diagnosis not present

## 2023-05-04 DIAGNOSIS — F1090 Alcohol use, unspecified, uncomplicated: Secondary | ICD-10-CM | POA: Diagnosis not present

## 2023-05-04 DIAGNOSIS — Z720 Tobacco use: Secondary | ICD-10-CM | POA: Diagnosis not present

## 2023-05-04 DIAGNOSIS — N3010 Interstitial cystitis (chronic) without hematuria: Secondary | ICD-10-CM | POA: Diagnosis not present

## 2023-05-05 DIAGNOSIS — K852 Alcohol induced acute pancreatitis without necrosis or infection: Secondary | ICD-10-CM | POA: Diagnosis not present

## 2023-05-05 DIAGNOSIS — F1090 Alcohol use, unspecified, uncomplicated: Secondary | ICD-10-CM | POA: Diagnosis not present

## 2023-05-05 DIAGNOSIS — Z720 Tobacco use: Secondary | ICD-10-CM | POA: Diagnosis not present

## 2023-05-05 DIAGNOSIS — F32A Depression, unspecified: Secondary | ICD-10-CM | POA: Diagnosis not present

## 2023-05-05 DIAGNOSIS — G894 Chronic pain syndrome: Secondary | ICD-10-CM | POA: Diagnosis not present

## 2023-05-05 DIAGNOSIS — N3010 Interstitial cystitis (chronic) without hematuria: Secondary | ICD-10-CM | POA: Diagnosis not present

## 2023-05-06 DIAGNOSIS — F32A Depression, unspecified: Secondary | ICD-10-CM | POA: Diagnosis not present

## 2023-05-06 DIAGNOSIS — N3010 Interstitial cystitis (chronic) without hematuria: Secondary | ICD-10-CM | POA: Diagnosis not present

## 2023-05-06 DIAGNOSIS — Z789 Other specified health status: Secondary | ICD-10-CM | POA: Diagnosis not present

## 2023-05-06 DIAGNOSIS — K852 Alcohol induced acute pancreatitis without necrosis or infection: Secondary | ICD-10-CM | POA: Diagnosis not present

## 2023-05-07 DIAGNOSIS — N3010 Interstitial cystitis (chronic) without hematuria: Secondary | ICD-10-CM | POA: Diagnosis not present

## 2023-05-07 DIAGNOSIS — G894 Chronic pain syndrome: Secondary | ICD-10-CM | POA: Diagnosis not present

## 2023-05-07 DIAGNOSIS — K852 Alcohol induced acute pancreatitis without necrosis or infection: Secondary | ICD-10-CM | POA: Diagnosis not present

## 2023-05-07 DIAGNOSIS — Z789 Other specified health status: Secondary | ICD-10-CM | POA: Diagnosis not present

## 2023-05-07 DIAGNOSIS — K7010 Alcoholic hepatitis without ascites: Secondary | ICD-10-CM | POA: Diagnosis not present

## 2023-05-07 DIAGNOSIS — Z720 Tobacco use: Secondary | ICD-10-CM | POA: Diagnosis not present

## 2023-05-08 DIAGNOSIS — F32A Depression, unspecified: Secondary | ICD-10-CM | POA: Diagnosis not present

## 2023-05-08 DIAGNOSIS — N3010 Interstitial cystitis (chronic) without hematuria: Secondary | ICD-10-CM | POA: Diagnosis not present

## 2023-05-08 DIAGNOSIS — Z720 Tobacco use: Secondary | ICD-10-CM | POA: Diagnosis not present

## 2023-05-08 DIAGNOSIS — G894 Chronic pain syndrome: Secondary | ICD-10-CM | POA: Diagnosis not present

## 2023-05-08 DIAGNOSIS — F1090 Alcohol use, unspecified, uncomplicated: Secondary | ICD-10-CM | POA: Diagnosis not present

## 2023-05-08 DIAGNOSIS — K852 Alcohol induced acute pancreatitis without necrosis or infection: Secondary | ICD-10-CM | POA: Diagnosis not present

## 2023-05-09 DIAGNOSIS — Z720 Tobacco use: Secondary | ICD-10-CM | POA: Diagnosis not present

## 2023-05-09 DIAGNOSIS — F1090 Alcohol use, unspecified, uncomplicated: Secondary | ICD-10-CM | POA: Diagnosis not present

## 2023-05-09 DIAGNOSIS — N3010 Interstitial cystitis (chronic) without hematuria: Secondary | ICD-10-CM | POA: Diagnosis not present

## 2023-05-09 DIAGNOSIS — K852 Alcohol induced acute pancreatitis without necrosis or infection: Secondary | ICD-10-CM | POA: Diagnosis not present

## 2023-05-09 DIAGNOSIS — F32A Depression, unspecified: Secondary | ICD-10-CM | POA: Diagnosis not present

## 2023-05-09 DIAGNOSIS — G894 Chronic pain syndrome: Secondary | ICD-10-CM | POA: Diagnosis not present

## 2023-05-15 DIAGNOSIS — Z79899 Other long term (current) drug therapy: Secondary | ICD-10-CM | POA: Diagnosis not present

## 2023-05-15 DIAGNOSIS — Z6827 Body mass index (BMI) 27.0-27.9, adult: Secondary | ICD-10-CM | POA: Diagnosis not present

## 2023-05-15 DIAGNOSIS — R102 Pelvic and perineal pain: Secondary | ICD-10-CM | POA: Diagnosis not present

## 2023-05-15 DIAGNOSIS — Z79891 Long term (current) use of opiate analgesic: Secondary | ICD-10-CM | POA: Diagnosis not present

## 2023-05-15 DIAGNOSIS — K852 Alcohol induced acute pancreatitis without necrosis or infection: Secondary | ICD-10-CM | POA: Diagnosis not present

## 2023-05-15 DIAGNOSIS — G8929 Other chronic pain: Secondary | ICD-10-CM | POA: Diagnosis not present

## 2023-05-18 ENCOUNTER — Encounter: Payer: Self-pay | Admitting: Physician Assistant

## 2023-05-18 ENCOUNTER — Telehealth: Payer: Self-pay | Admitting: Physician Assistant

## 2023-05-18 DIAGNOSIS — Z79899 Other long term (current) drug therapy: Secondary | ICD-10-CM | POA: Diagnosis not present

## 2023-05-18 NOTE — Telephone Encounter (Signed)
Called patient to scheduled Hospital follow up, will call back to schedule: [facility]- Acute pancreatitis.

## 2023-05-20 ENCOUNTER — Ambulatory Visit (INDEPENDENT_AMBULATORY_CARE_PROVIDER_SITE_OTHER): Payer: Medicare HMO | Admitting: Physician Assistant

## 2023-05-20 ENCOUNTER — Encounter: Payer: Self-pay | Admitting: Physician Assistant

## 2023-05-20 VITALS — BP 138/78 | HR 88 | Ht 66.0 in | Wt 166.0 lb

## 2023-05-20 DIAGNOSIS — R110 Nausea: Secondary | ICD-10-CM

## 2023-05-20 DIAGNOSIS — E875 Hyperkalemia: Secondary | ICD-10-CM | POA: Diagnosis not present

## 2023-05-20 DIAGNOSIS — F1011 Alcohol abuse, in remission: Secondary | ICD-10-CM

## 2023-05-20 DIAGNOSIS — K852 Alcohol induced acute pancreatitis without necrosis or infection: Secondary | ICD-10-CM | POA: Insufficient documentation

## 2023-05-20 DIAGNOSIS — L853 Xerosis cutis: Secondary | ICD-10-CM | POA: Insufficient documentation

## 2023-05-20 DIAGNOSIS — Z23 Encounter for immunization: Secondary | ICD-10-CM | POA: Diagnosis not present

## 2023-05-20 MED ORDER — PROMETHAZINE HCL 25 MG PO TABS: ORAL_TABLET | ORAL | 1 refills | Status: DC

## 2023-05-20 NOTE — Progress Notes (Signed)
Established Patient Office Visit  Subjective   Patient ID: [name], female    DOB: 1966/04/19  Age: 57 y.o. MRN: [id number]  Chief Complaint  Patient presents with   Hospitalization Follow-up    HPI 57 yo female presents today for hospital f/u. She had alcohol-induced acute pancreatitis and was admitted on 11/17 and discharged on 11/26. She has been sober from alcohol since 11/16. She had a feeding tube placed. She is taking hydrocodone for pain. Her husband has administered 3 lidocaine injections for pain from IC since discharge, which has decreased.   Today, her abdomen is still in pain but much improved and feels like her baseline IC pain.  She denies acute urinary symptoms, CP, SOB, leg swelling, and vomiting. Her husband says she could drink more fluids.  Hospital notes: Abnormal labs as follows: Elevated pro-BNP, elevated lipase, hyperkalemia, elevated liver enzymes, elevated ferritin,   CT Abdomen Pelvis W IV Contrast as follows: Final Result  Impression:  1. Acute pancreatitis.  2. Wall thickening in the proximal descending colon is likely related to adjacent pancreatitis.  3. Hepatic steatosis.  4. Small bilateral pleural effusions.  5. Small ascites in the abdomen and pelvis.   She also reports a rash on her elbows and backs of arms. Denies itchiness.   Review of Systems  Respiratory:  Negative for shortness of breath.   Cardiovascular:  Negative for chest pain and leg swelling.  Gastrointestinal:  Negative for vomiting.  Skin:  Positive for rash.      Objective:     BP 138/78   Pulse 88   Ht 5\' 6"  (1.676 m)   Wt 166 lb (75.3 kg)   SpO2 99%   BMI 26.79 kg/m    Physical Exam Constitutional:      Appearance: Normal appearance.  HENT:     Head: Normocephalic.  Cardiovascular:     Rate and Rhythm: Normal rate and regular rhythm.     Pulses: Normal pulses.     Heart sounds: Normal heart sounds.  Pulmonary:     Effort: Pulmonary effort is  normal.     Breath sounds: Normal breath sounds.  Abdominal:     Comments: Pt declined abdominal exam.   Musculoskeletal:     Right lower leg: No edema.     Left lower leg: No edema.  Skin:    General: Skin is dry.  Neurological:     Mental Status: She is alert and oriented to person, place, and time.  Psychiatric:        Mood and Affect: Mood normal.       The 10-year ASCVD risk score (Arnett DK, et al., 2019) is: 7.2%    Assessment & Plan:  [name] KitchenMarland KitchenShaqueta was seen today for hospitalization follow-up.  Diagnoses and all orders for this visit:  Alcohol-induced acute pancreatitis, unspecified complication status -     CMP14+EGFR -     Fe+TIBC+Fer -     CBC w/Diff/Platelet -     Lipase -     Brain natriuretic peptide -     Alpha-1-antitrypsin  Hyperkalemia -     CMP14+EGFR  Alcohol abuse, in remission  Nausea -     promethazine (PHENERGAN) 25 MG tablet; TAKE 1 TABLET DAILY AS     NEEDED FOR NAUSEA  Immunization due -     Flu vaccine trivalent PF, 6mos and older(Flulaval,Afluria,Fluarix,Fluzone)  Dry skin   Pt appears to be improving a lot. Advised patient to increase fluids to  at least 100 oz/day. Her skin is really dry.  Repeat BNP, CMP, CBC, lipase, alpha-antitrypsin 1, and iron panel labs ordered. Refilled phenergan to use as needed.   Congrats on stopping all alcohol!   Flu vaccine administered today. Return if symptoms worsen or fail to improve.    Tandy Gaw, PA-C

## 2023-05-21 LAB — CMP14+EGFR
ALT: 17 [IU]/L (ref 0–32)
AST: 18 [IU]/L (ref 0–40)
Albumin: 3.6 g/dL — ABNORMAL LOW (ref 3.8–4.9)
Alkaline Phosphatase: 118 [IU]/L (ref 44–121)
BUN/Creatinine Ratio: 14 (ref 9–23)
BUN: 13 mg/dL (ref 6–24)
Bilirubin Total: 0.6 mg/dL (ref 0.0–1.2)
CO2: 26 mmol/L (ref 20–29)
Calcium: 9.3 mg/dL (ref 8.7–10.2)
Chloride: 97 mmol/L (ref 96–106)
Creatinine, Ser: 0.96 mg/dL (ref 0.57–1.00)
Globulin, Total: 3.4 g/dL (ref 1.5–4.5)
Glucose: 90 mg/dL (ref 70–99)
Potassium: 2.9 mmol/L — ABNORMAL LOW (ref 3.5–5.2)
Sodium: 142 mmol/L (ref 134–144)
Total Protein: 7 g/dL (ref 6.0–8.5)
eGFR: 69 mL/min/{1.73_m2} (ref >59)

## 2023-05-21 LAB — CBC WITH DIFFERENTIAL/PLATELET
Basophils Absolute: 0.1 10*3/uL (ref 0.0–0.2)
Basos: 0 %
EOS (ABSOLUTE): 0.1 10*3/uL (ref 0.0–0.4)
Eos: 1 %
Hematocrit: 40 % (ref 34.0–46.6)
Hemoglobin: 13.1 g/dL (ref 11.1–15.9)
Immature Grans (Abs): 0 10*3/uL (ref 0.0–0.1)
Immature Granulocytes: 0 %
Lymphocytes Absolute: 1.2 10*3/uL (ref 0.7–3.1)
Lymphs: 9 %
MCH: 30 pg (ref 26.6–33.0)
MCHC: 32.8 g/dL (ref 31.5–35.7)
MCV: 92 fL (ref 79–97)
Monocytes Absolute: 1 10*3/uL — ABNORMAL HIGH (ref 0.1–0.9)
Monocytes: 7 %
Neutrophils Absolute: 11.3 10*3/uL — ABNORMAL HIGH (ref 1.4–7.0)
Neutrophils: 83 %
Platelets: 491 10*3/uL — ABNORMAL HIGH (ref 150–450)
RBC: 4.36 x10E6/uL (ref 3.77–5.28)
RDW: 14 % (ref 11.7–15.4)
WBC: 13.8 10*3/uL — ABNORMAL HIGH (ref 3.4–10.8)

## 2023-05-21 LAB — IRON,TIBC AND FERRITIN PANEL
Ferritin: 576 ng/mL — ABNORMAL HIGH (ref 15–150)
Iron Saturation: 11 % — ABNORMAL LOW (ref 15–55)
Iron: 21 ug/dL — ABNORMAL LOW (ref 27–159)
Total Iron Binding Capacity: 188 ug/dL — ABNORMAL LOW (ref 250–450)
UIBC: 167 ug/dL (ref 131–425)

## 2023-05-21 LAB — LIPASE: Lipase: 40 U/L (ref 14–72)

## 2023-05-21 LAB — BRAIN NATRIURETIC PEPTIDE: BNP: 341.3 pg/mL — ABNORMAL HIGH (ref 0.0–100.0)

## 2023-05-21 LAB — ALPHA-1-ANTITRYPSIN: A-1 Antitrypsin: 309 mg/dL — ABNORMAL HIGH (ref 101–187)

## 2023-05-23 ENCOUNTER — Other Ambulatory Visit: Payer: Self-pay | Admitting: Physician Assistant

## 2023-05-23 DIAGNOSIS — E876 Hypokalemia: Secondary | ICD-10-CM

## 2023-05-23 MED ORDER — POTASSIUM CHLORIDE CRYS ER 10 MEQ PO TBCR
10.0000 meq | EXTENDED_RELEASE_TABLET | Freq: Every day | ORAL | 0 refills | Status: DC
Start: 2023-05-23 — End: 2023-06-14

## 2023-05-23 NOTE — Progress Notes (Signed)
Lipase has come WAY down from hospital.  Everything appears to trending down as expected but not normal yet  Potassium is the only think we should supplement now. I will send over daily dose and recheck on Thursday potassium again.

## 2023-05-26 DIAGNOSIS — E876 Hypokalemia: Secondary | ICD-10-CM | POA: Diagnosis not present

## 2023-05-27 ENCOUNTER — Encounter: Payer: Self-pay | Admitting: Physician Assistant

## 2023-05-27 ENCOUNTER — Other Ambulatory Visit: Payer: Self-pay

## 2023-05-27 DIAGNOSIS — E876 Hypokalemia: Secondary | ICD-10-CM

## 2023-05-27 LAB — POTASSIUM: Potassium: 3.4 mmol/L — ABNORMAL LOW (ref 3.5–5.2)

## 2023-05-27 NOTE — Progress Notes (Signed)
Potassium is much better and almost in normal range. Stay on current potassium and recheck in 2 weeks. We may be able to stop this at some point so need to monitor it closely.

## 2023-06-13 DIAGNOSIS — G8929 Other chronic pain: Secondary | ICD-10-CM | POA: Diagnosis not present

## 2023-06-13 DIAGNOSIS — Z79899 Other long term (current) drug therapy: Secondary | ICD-10-CM | POA: Diagnosis not present

## 2023-06-13 DIAGNOSIS — R102 Pelvic and perineal pain: Secondary | ICD-10-CM | POA: Diagnosis not present

## 2023-06-13 DIAGNOSIS — Z79891 Long term (current) use of opiate analgesic: Secondary | ICD-10-CM | POA: Diagnosis not present

## 2023-06-13 DIAGNOSIS — Z6826 Body mass index (BMI) 26.0-26.9, adult: Secondary | ICD-10-CM | POA: Diagnosis not present

## 2023-06-14 ENCOUNTER — Other Ambulatory Visit: Payer: Self-pay | Admitting: Physician Assistant

## 2023-06-16 DIAGNOSIS — R634 Abnormal weight loss: Secondary | ICD-10-CM | POA: Diagnosis not present

## 2023-06-16 DIAGNOSIS — R748 Abnormal levels of other serum enzymes: Secondary | ICD-10-CM | POA: Diagnosis not present

## 2023-06-16 DIAGNOSIS — Z1211 Encounter for screening for malignant neoplasm of colon: Secondary | ICD-10-CM | POA: Diagnosis not present

## 2023-06-16 DIAGNOSIS — K862 Cyst of pancreas: Secondary | ICD-10-CM | POA: Diagnosis not present

## 2023-06-16 DIAGNOSIS — R109 Unspecified abdominal pain: Secondary | ICD-10-CM | POA: Diagnosis not present

## 2023-06-16 DIAGNOSIS — K8500 Idiopathic acute pancreatitis without necrosis or infection: Secondary | ICD-10-CM | POA: Diagnosis not present

## 2023-06-16 DIAGNOSIS — K838 Other specified diseases of biliary tract: Secondary | ICD-10-CM | POA: Diagnosis not present

## 2023-06-16 DIAGNOSIS — R935 Abnormal findings on diagnostic imaging of other abdominal regions, including retroperitoneum: Secondary | ICD-10-CM | POA: Diagnosis not present

## 2023-06-16 DIAGNOSIS — Z79899 Other long term (current) drug therapy: Secondary | ICD-10-CM | POA: Diagnosis not present

## 2023-06-20 ENCOUNTER — Encounter: Payer: Medicare HMO | Admitting: Family Medicine

## 2023-06-30 ENCOUNTER — Other Ambulatory Visit: Payer: Self-pay | Admitting: Physician Assistant

## 2023-06-30 DIAGNOSIS — N3011 Interstitial cystitis (chronic) with hematuria: Secondary | ICD-10-CM

## 2023-06-30 DIAGNOSIS — R110 Nausea: Secondary | ICD-10-CM

## 2023-06-30 DIAGNOSIS — G894 Chronic pain syndrome: Secondary | ICD-10-CM

## 2023-07-08 DIAGNOSIS — G8929 Other chronic pain: Secondary | ICD-10-CM | POA: Diagnosis not present

## 2023-07-08 DIAGNOSIS — Z79899 Other long term (current) drug therapy: Secondary | ICD-10-CM | POA: Diagnosis not present

## 2023-07-08 DIAGNOSIS — R102 Pelvic and perineal pain: Secondary | ICD-10-CM | POA: Diagnosis not present

## 2023-07-08 DIAGNOSIS — F1120 Opioid dependence, uncomplicated: Secondary | ICD-10-CM | POA: Diagnosis not present

## 2023-07-08 DIAGNOSIS — Z6825 Body mass index (BMI) 25.0-25.9, adult: Secondary | ICD-10-CM | POA: Diagnosis not present

## 2023-07-15 DIAGNOSIS — Z79899 Other long term (current) drug therapy: Secondary | ICD-10-CM | POA: Diagnosis not present

## 2023-08-06 ENCOUNTER — Other Ambulatory Visit: Payer: Self-pay | Admitting: Physician Assistant

## 2023-08-06 DIAGNOSIS — G2581 Restless legs syndrome: Secondary | ICD-10-CM

## 2023-08-10 DIAGNOSIS — F1120 Opioid dependence, uncomplicated: Secondary | ICD-10-CM | POA: Diagnosis not present

## 2023-08-10 DIAGNOSIS — Z6825 Body mass index (BMI) 25.0-25.9, adult: Secondary | ICD-10-CM | POA: Diagnosis not present

## 2023-08-10 DIAGNOSIS — G8929 Other chronic pain: Secondary | ICD-10-CM | POA: Diagnosis not present

## 2023-08-10 DIAGNOSIS — Z79899 Other long term (current) drug therapy: Secondary | ICD-10-CM | POA: Diagnosis not present

## 2023-08-10 DIAGNOSIS — N390 Urinary tract infection, site not specified: Secondary | ICD-10-CM | POA: Diagnosis not present

## 2023-08-10 DIAGNOSIS — R102 Pelvic and perineal pain: Secondary | ICD-10-CM | POA: Diagnosis not present

## 2023-08-12 ENCOUNTER — Other Ambulatory Visit: Payer: Self-pay | Admitting: Physician Assistant

## 2023-08-12 DIAGNOSIS — Z8711 Personal history of peptic ulcer disease: Secondary | ICD-10-CM

## 2023-08-12 DIAGNOSIS — K449 Diaphragmatic hernia without obstruction or gangrene: Secondary | ICD-10-CM

## 2023-08-12 DIAGNOSIS — Z79899 Other long term (current) drug therapy: Secondary | ICD-10-CM | POA: Diagnosis not present

## 2023-08-22 ENCOUNTER — Ambulatory Visit: Payer: Self-pay | Admitting: Physician Assistant

## 2023-08-22 NOTE — Telephone Encounter (Signed)
  Chief Complaint: Cough Symptoms: Cough, neck pain, lymph nodes swollen, earache Frequency: going on two weeks Pertinent Negatives: Patient denies CP, SOB Disposition: [] ED /[] Urgent Care (no appt availability in office) / [x] Appointment(In office/virtual)/ []  [facility]/ [] Home Care/ [] Refused Recommended Disposition /[] [place] [facility]/ []  Follow-up with PCP Additional Notes: patient called with concerns for cough, neck pain, swollen lymph nodes and earache. Patient states she has been feeling poorly for almost two weeks. Patient states she was running fevers at the beginning of illness. Endorse cough that goes from being productive at times to being nonproductive. Per protocol, the recommendation is for patient to be seen in 24 hours. Appointment scheduled for 08/23/2023 at 11:30 am with PCP. Patient verbalized understanding of plan and all questions answered.    Copied from CRM [id number]. Topic: Clinical - Red Word Triage >> Aug 22, 2023  9:33 AM Nila Nephew wrote: Red Word that prompted transfer to Nurse Triage: states sick ongoing for two weeks, lymph nodes are swollen and feels like she has swimmers ear Reason for Disposition  [1] Continuous (nonstop) coughing interferes with work or school AND [2] no improvement using cough treatment per Care Advice  Answer Assessment - Initial Assessment Questions 1. ONSET: "When did the cough begin?"      2 weeks ago 2. SEVERITY: "How bad is the cough today?"      3. SPUTUM: "Describe the color of your sputum" (none, dry cough; clear, white, yellow, green)     white 4. HEMOPTYSIS: "Are you coughing up any blood?" If so ask: "How much?" (flecks, streaks, tablespoons, etc.)     No 5. DIFFICULTY BREATHING: "Are you having difficulty breathing?" If Yes, ask: "How bad is it?" (e.g., mild, moderate, severe)    - MILD: No SOB at rest, mild SOB with walking, speaks normally in sentences, can lie down, no retractions, pulse < 100.    -  MODERATE: SOB at rest, SOB with minimal exertion and prefers to sit, cannot lie down flat, speaks in phrases, mild retractions, audible wheezing, pulse 100-120.    - SEVERE: Very SOB at rest, speaks in single words, struggling to breathe, sitting hunched forward, retractions, pulse > 120      No 6. FEVER: "Do you have a fever?" If Yes, ask: "What is your temperature, how was it measured, and when did it start?"     Ran a temperature at the beginning of symptoms-no fever currently 7. CARDIAC HISTORY: "Do you have any history of heart disease?" (e.g., heart attack, congestive heart failure)      No 8. LUNG HISTORY: "Do you have any history of lung disease?"  (e.g., pulmonary embolus, asthma, emphysema)     No 9. PE RISK FACTORS: "Do you have a history of blood clots?" (or: recent major surgery, recent prolonged travel, bedridden)     No 10. OTHER SYMPTOMS: "Do you have any other symptoms?" (e.g., runny nose, wheezing, chest pain)       Neck Pain, body aches, earache 12. TRAVEL: "Have you traveled out of the country in the last month?" (e.g., travel history, exposures)       No  Protocols used: Cough - Acute Productive-A-AH

## 2023-08-23 ENCOUNTER — Ambulatory Visit (INDEPENDENT_AMBULATORY_CARE_PROVIDER_SITE_OTHER): Admitting: Physician Assistant

## 2023-08-23 ENCOUNTER — Encounter: Payer: Self-pay | Admitting: Physician Assistant

## 2023-08-23 VITALS — BP 95/65 | HR 84 | Ht 66.0 in | Wt 152.5 lb

## 2023-08-23 DIAGNOSIS — R7989 Other specified abnormal findings of blood chemistry: Secondary | ICD-10-CM | POA: Diagnosis not present

## 2023-08-23 DIAGNOSIS — E876 Hypokalemia: Secondary | ICD-10-CM

## 2023-08-23 DIAGNOSIS — H66001 Acute suppurative otitis media without spontaneous rupture of ear drum, right ear: Secondary | ICD-10-CM | POA: Diagnosis not present

## 2023-08-23 DIAGNOSIS — D72829 Elevated white blood cell count, unspecified: Secondary | ICD-10-CM | POA: Diagnosis not present

## 2023-08-23 MED ORDER — AMOXICILLIN-POT CLAVULANATE 875-125 MG PO TABS: 1.0000 | ORAL_TABLET | Freq: Two times a day (BID) | ORAL | 0 refills | Status: DC

## 2023-08-23 MED ORDER — FLUTICASONE PROPIONATE 50 MCG/ACT NA SUSP: 2.0000 | Freq: Every day | NASAL | 0 refills | Status: DC

## 2023-08-23 NOTE — Progress Notes (Signed)
 Acute Office Visit  Subjective:     Patient ID: [name], female    DOB: July 08, 1965, 58 y.o.   MRN: [id number]  CC: cough   HPI Patient is a 58 yo female who presents with a chief complaint of a cough and earache x2 weeks She states she has high fevers at the beginning of the illness. She states she cannot hear out of her right ear and ear fullness. She endorses coughing up more phlegm. She has been using OTC meds for symptom control.   She had labs during her pancreatitis episode that were abnormal and would like rechecked today.   .. Active Ambulatory Problems    Diagnosis Date Noted   TOBACCO ABUSE 05/28/2009   Mitral valve disease 05/28/2009   Cardiac dysrhythmia 05/16/2009   TMJ PAIN 07/26/2008   Gastric ulcer 05/26/2009   NEPHROLITHIASIS 03/06/2010   HEMATURIA UNSPECIFIED 12/19/2008   OVARIAN CYST 03/06/2010   PALPITATIONS 05/16/2009   Diarrhea 11/04/2009   History of cardiovascular disorder 05/26/2009   Depressive disorder, not elsewhere classified 11/06/2010   Lymphocytic colitis 12/01/2011   EBV infection 04/21/2012   Chronic pain syndrome 06/21/2012   Fibromyalgia 06/21/2012   Carpal tunnel syndrome, bilateral 06/21/2012   Degenerative disc disease, cervical 06/21/2012   Facet arthropathy, cervical 06/21/2012   DDD (degenerative disc disease), lumbosacral 06/21/2012   Lower abdominal pain 07/26/2012   Interstitial cystitis (chronic) with hematuria 04/13/2013   Primary osteoarthritis of right knee 12/20/2013   Incontinence in female 06/26/2014   Acute bilateral lower abdominal pain 06/26/2014   Pain in joint, ankle and foot 04/21/2015   Status post implantation of urinary electronic stimulator device 05/05/2015   Post-menopausal 06/20/2015   Family history of cardiovascular disease 06/20/2015   Drug induced constipation 06/20/2015   No energy 06/20/2015   Dysuria 06/20/2015   Hiatal hernia 07/16/2015   Depression 09/03/2015   Primary  osteoarthritis of left hand 02/27/2016   Breast pain, right 09/14/2016   Abnormal weight gain 03/16/2017   BMI 27.0-27.9,adult 03/16/2017   History of gastric ulcer 04/09/2019   RLS (restless legs syndrome) 04/09/2019   Mild episode of recurrent major depressive disorder (HCC) 03/17/2021   Stuttering 03/17/2021   Elevated LDL cholesterol level 03/17/2021   Word finding difficulty 03/17/2021   Sleep talking 03/17/2021   Abnormal MRI of head 05/19/2021   Chronic, continuous use of opioids 09/28/2013   Smoker 05/28/2009   Hyponatremia 06/10/2021   Anorexia 06/10/2021   Adult failure to thrive 06/10/2021   Acute lower UTI 06/10/2021   Elevated lipase 06/17/2021   Fatty liver 06/17/2021   Common bile duct dilatation 06/17/2021   Hypotension 11/20/2021   Malaise 11/20/2021   History of pyelonephritis 11/20/2021   Alcohol abuse, in remission 02/26/2022   Left forearm pain 02/26/2022   Alcohol withdrawal hallucinosis (HCC) 02/26/2022   Abnormal urine odor 02/26/2022   Frequent falls 02/26/2022   Memory changes 02/26/2022   Grief at loss of child 06/25/2022   Adjustment disorder with mixed anxiety and depressed mood 06/25/2022   PTSD (post-traumatic stress disorder) 09/27/2022   Nausea 02/18/2023   History of kidney stones 04/29/2023   Hyperkalemia 05/20/2023   Alcohol-induced acute pancreatitis 05/20/2023   Dry skin 05/20/2023   Leukocytosis 08/24/2023   Hypokalemia 08/24/2023   Elevated brain natriuretic peptide (BNP) level 08/24/2023   Resolved Ambulatory Problems    Diagnosis Date Noted   Other changes of lacrimal passages 03/01/2010   Acute upper respiratory infection 08/17/2008  STONE, URINARY CALCULUS,UNSPEC. 12/29/2008   FOOT PAIN, LEFT 04/14/2010   Contusion of hand 05/10/2009   Burn 04/14/2010   Nephrolithiasis 11/06/2010   Bacterial folliculitis 01/09/2011   Acute pharyngitis 01/30/2011   BRONCHITIS, ACUTE 02/04/2011   Abdominal pain 02/04/2011   Past  Medical History:  Diagnosis Date   Anxiety    Cancer (HCC)    Colitis    Dysrhythmia    Gastric ulcer    Headache(784.0)    Heart murmur    IBS (irritable bowel syndrome)    Interstitial cystitis    MVP (mitral valve prolapse)    Urinary calculus or stone      ROS See HPI     Objective:    BP 95/65 (BP Location: Left Arm, Patient Position: Sitting, Cuff Size: Normal)   Pulse 84   Ht 5\' 6"  (1.676 m)   Wt 152 lb 8 oz (69.2 kg)   BMI 24.61 kg/m  BP Readings from Last 3 Encounters:  08/23/23 95/65  05/20/23 138/78  04/29/23 126/83   Wt Readings from Last 3 Encounters:  08/23/23 152 lb 8 oz (69.2 kg)  05/20/23 166 lb (75.3 kg)  04/29/23 176 lb 12 oz (80.2 kg)   SpO2 Readings from Last 3 Encounters:  05/20/23 99%  04/29/23 95%  04/29/23 93%      Physical Exam Constitutional:      Appearance: Normal appearance.  HENT:     Head: Normocephalic and atraumatic.     Right Ear: Ear canal normal.     Left Ear: Ear canal normal.     Ears:     Comments: Fluid behind TMs bilaterally  Erythema surrounding right TM Cardiovascular:     Rate and Rhythm: Normal rate and regular rhythm.     Pulses: Normal pulses.     Heart sounds: Normal heart sounds.  Pulmonary:     Effort: Pulmonary effort is normal.     Breath sounds: Normal breath sounds.  Skin:    General: Skin is warm.  Neurological:     Mental Status: She is alert.  Psychiatric:        Mood and Affect: Mood normal.        Behavior: Behavior normal.    Assessment & Plan:  [name] KitchenMarland KitchenAryelle was seen today for cough.  Diagnoses and all orders for this visit:  Non-recurrent acute suppurative otitis media of right ear without spontaneous rupture of tympanic membrane -     amoxicillin-clavulanate (AUGMENTIN) 875-125 MG tablet; Take 1 tablet by mouth 2 (two) times daily. -     fluticasone (FLONASE) 50 MCG/ACT nasal spray; Place 2 sprays into both nostrils daily.  Elevated brain natriuretic peptide (BNP) level -      Brain natriuretic peptide  Hypokalemia -     CMP14+EGFR  Leukocytosis, unspecified type -     CBC w/Diff/Platelet   Treated Otitis media with augmentin and flonase Follow up as needed Recheck labs from abnormal readings a few months ago   Tandy Gaw, [place]

## 2023-08-23 NOTE — Patient Instructions (Signed)
 Otitis Media, Adult    Otitis media is a condition in which the middle ear is red and swollen (inflamed) and full of fluid. The middle ear is the part of the ear that contains bones for hearing as well as air that helps send sounds to the brain. The condition usually goes away on its own.  What are the causes?  This condition is caused by a blockage in the eustachian tube. This tube connects the middle ear to the back of the nose. It normally allows air into the middle ear. The blockage is caused by fluid or swelling. Problems that can cause blockage include:  A cold or infection that affects the nose, mouth, or throat.  Allergies.  An irritant, such as tobacco smoke.  Adenoids that have become large. The adenoids are soft tissue located in the back of the throat, behind the nose and the roof of the mouth.  Growth or swelling in the upper part of the throat, just behind the nose (nasopharynx).  Damage to the ear caused by a change in pressure. This is called barotrauma.  What increases the risk?  You are more likely to develop this condition if you:  Smoke or are exposed to tobacco smoke.  Have an opening in the roof of your mouth (cleft palate).  Have acid reflux.  Have problems in your body's defense system (immune system).  What are the signs or symptoms?  Symptoms of this condition include:  Ear pain.  Fever.  Problems with hearing.  Being tired.  Fluid leaking from the ear.  Ringing in the ear.  How is this treated?  This condition can go away on its own within 3-5 days. But if the condition is caused by germs (bacteria) and does not go away on its own, or if it keeps coming back, your doctor may:  Give you antibiotic medicines.  Give you medicines for pain.  Follow these instructions at home:  Take over-the-counter and prescription medicines only as told by your doctor.  If you were prescribed an antibiotic medicine, take it as told by your doctor. Do not stop taking it even if you start to feel better.  Keep  all follow-up visits.  Contact a doctor if:  You have bleeding from your nose.  There is a lump on your neck.  You are not feeling better in 5 days.  You feel worse instead of better.  Get help right away if:  You have pain that is not helped with medicine.  You have swelling, redness, or pain around your ear.  You get a stiff neck.  You cannot move part of your face (paralysis).  You notice that the bone behind your ear hurts when you touch it.  You get a very bad headache.  Summary  Otitis media means that the middle ear is red, swollen, and full of fluid.  This condition usually goes away on its own.  If the problem does not go away, treatment may be needed. You may be given medicines to treat the infection or to treat your pain.  If you were prescribed an antibiotic medicine, take it as told by your doctor. Do not stop taking it even if you start to feel better.  Keep all follow-up visits.  This information is not intended to replace advice given to you by your health care provider. Make sure you discuss any questions you have with your health care provider.  Document Revised: 09/08/2020 Document Reviewed: 09/08/2020  Elsevier  Patient Education  2024 [employer].

## 2023-08-24 ENCOUNTER — Encounter: Payer: Self-pay | Admitting: Physician Assistant

## 2023-08-24 DIAGNOSIS — R7989 Other specified abnormal findings of blood chemistry: Secondary | ICD-10-CM

## 2023-08-24 DIAGNOSIS — R0602 Shortness of breath: Secondary | ICD-10-CM

## 2023-08-24 DIAGNOSIS — D72829 Elevated white blood cell count, unspecified: Secondary | ICD-10-CM | POA: Insufficient documentation

## 2023-08-24 DIAGNOSIS — E876 Hypokalemia: Secondary | ICD-10-CM | POA: Insufficient documentation

## 2023-08-24 NOTE — Progress Notes (Signed)
 [name],   Albumin is LOW. You need to make sure you are eating 3 meals a day and 80g of protein!   Hemoglobin is low! Please add ferritin and serum iron. You need to start oral iron. Have you been able to tolerate this in the past? Are you having any blood in stool?

## 2023-08-26 LAB — CMP14+EGFR
ALT: 18 IU/L (ref 0–32)
AST: 22 IU/L (ref 0–40)
Albumin: 2.7 g/dL — ABNORMAL LOW (ref 3.8–4.9)
Alkaline Phosphatase: 101 IU/L (ref 44–121)
BUN/Creatinine Ratio: 15 (ref 9–23)
BUN: 16 mg/dL (ref 6–24)
Bilirubin Total: <0.2 mg/dL (ref 0.0–1.2)
CO2: 23 mmol/L (ref 20–29)
Calcium: 9 mg/dL (ref 8.7–10.2)
Chloride: 99 mmol/L (ref 96–106)
Creatinine, Ser: 1.06 mg/dL — ABNORMAL HIGH (ref 0.57–1.00)
Globulin, Total: 3.4 g/dL (ref 1.5–4.5)
Glucose: 98 mg/dL (ref 70–99)
Potassium: 4.2 mmol/L (ref 3.5–5.2)
Sodium: 139 mmol/L (ref 134–144)
Total Protein: 6.1 g/dL (ref 6.0–8.5)
eGFR: 61 mL/min/{1.73_m2} (ref >59)

## 2023-08-26 LAB — CBC WITH DIFFERENTIAL/PLATELET
Basophils Absolute: 0 10*3/uL (ref 0.0–0.2)
Basos: 0 %
EOS (ABSOLUTE): 0.1 10*3/uL (ref 0.0–0.4)
Eos: 1 %
Hematocrit: 34.1 % (ref 34.0–46.6)
Hemoglobin: 10.8 g/dL — ABNORMAL LOW (ref 11.1–15.9)
Immature Grans (Abs): 0.1 10*3/uL (ref 0.0–0.1)
Immature Granulocytes: 1 %
Lymphocytes Absolute: 1.2 10*3/uL (ref 0.7–3.1)
Lymphs: 11 %
MCH: 27.8 pg (ref 26.6–33.0)
MCHC: 31.7 g/dL (ref 31.5–35.7)
MCV: 88 fL (ref 79–97)
Monocytes Absolute: 1 10*3/uL — ABNORMAL HIGH (ref 0.1–0.9)
Monocytes: 9 %
Neutrophils Absolute: 8.7 10*3/uL — ABNORMAL HIGH (ref 1.4–7.0)
Neutrophils: 78 %
Platelets: 439 10*3/uL (ref 150–450)
RBC: 3.89 x10E6/uL (ref 3.77–5.28)
RDW: 15.5 % — ABNORMAL HIGH (ref 11.7–15.4)
WBC: 11 10*3/uL — ABNORMAL HIGH (ref 3.4–10.8)

## 2023-08-26 LAB — BRAIN NATRIURETIC PEPTIDE: BNP: 210.3 pg/mL — ABNORMAL HIGH (ref 0.0–100.0)

## 2023-08-26 NOTE — Progress Notes (Signed)
 I repeated your BNP because elevated 3 months ago. Has improved but still elevated. I would like to repeat echo of heart? Are you ok with that?

## 2023-09-02 ENCOUNTER — Encounter: Payer: Self-pay | Admitting: Physician Assistant

## 2023-09-07 ENCOUNTER — Other Ambulatory Visit: Payer: Self-pay | Admitting: Physician Assistant

## 2023-09-07 DIAGNOSIS — G8929 Other chronic pain: Secondary | ICD-10-CM | POA: Diagnosis not present

## 2023-09-07 DIAGNOSIS — R102 Pelvic and perineal pain: Secondary | ICD-10-CM | POA: Diagnosis not present

## 2023-09-07 DIAGNOSIS — Z79899 Other long term (current) drug therapy: Secondary | ICD-10-CM | POA: Diagnosis not present

## 2023-09-07 DIAGNOSIS — F1120 Opioid dependence, uncomplicated: Secondary | ICD-10-CM | POA: Diagnosis not present

## 2023-09-07 DIAGNOSIS — Z6825 Body mass index (BMI) 25.0-25.9, adult: Secondary | ICD-10-CM | POA: Diagnosis not present

## 2023-09-07 DIAGNOSIS — H66001 Acute suppurative otitis media without spontaneous rupture of ear drum, right ear: Secondary | ICD-10-CM

## 2023-09-12 ENCOUNTER — Other Ambulatory Visit (HOSPITAL_COMMUNITY): Payer: Self-pay

## 2023-09-13 ENCOUNTER — Encounter (HOSPITAL_COMMUNITY): Payer: Self-pay

## 2023-09-13 ENCOUNTER — Telehealth (HOSPITAL_COMMUNITY): Payer: Self-pay | Admitting: *Deleted

## 2023-09-13 NOTE — Telephone Encounter (Signed)
Pt given instructions for ETT.

## 2023-09-15 ENCOUNTER — Ambulatory Visit (HOSPITAL_COMMUNITY)

## 2023-09-28 ENCOUNTER — Encounter (HOSPITAL_COMMUNITY): Payer: Self-pay

## 2023-09-29 ENCOUNTER — Ambulatory Visit (HOSPITAL_BASED_OUTPATIENT_CLINIC_OR_DEPARTMENT_OTHER)
Admission: RE | Admit: 2023-09-29 | Discharge: 2023-09-29 | Disposition: A | Discharge status: Home / Self Care | Source: Ambulatory Visit | Attending: Physician Assistant | Admitting: Physician Assistant

## 2023-09-29 DIAGNOSIS — R7989 Other specified abnormal findings of blood chemistry: Secondary | ICD-10-CM | POA: Insufficient documentation

## 2023-09-29 DIAGNOSIS — R0602 Shortness of breath: Secondary | ICD-10-CM | POA: Insufficient documentation

## 2023-09-29 LAB — ECHOCARDIOGRAM COMPLETE
AR max vel: 2.46 cm2
AV Area VTI: 2.52 cm2
AV Area mean vel: 2.61 cm2
AV Mean grad: 2 mmHg
AV Peak grad: 4.2 mmHg
Ao pk vel: 1.02 m/s
Area-P 1/2: 2.93 cm2
Calc EF: 59.4 %
MV M vel: 3.64 m/s
MV Peak grad: 53 mmHg
MV VTI: 1.51 cm2
S' Lateral: 2.6 cm
Single Plane A2C EF: 63 %
Single Plane A4C EF: 56.5 %

## 2023-10-03 ENCOUNTER — Ambulatory Visit (HOSPITAL_COMMUNITY): Attending: Physician Assistant

## 2023-10-04 ENCOUNTER — Encounter: Payer: Self-pay | Admitting: Physician Assistant

## 2023-10-04 DIAGNOSIS — I341 Nonrheumatic mitral (valve) prolapse: Secondary | ICD-10-CM | POA: Insufficient documentation

## 2023-10-04 NOTE — Progress Notes (Signed)
 [name],   Ejection fraction is normal. Which is great news.  There is some thickening of heart likely from high blood pressure over the years.  Mild prolapse of the leaflet of the mitral valve without any significant regurgitation.   Overall good echo.

## 2023-10-05 DIAGNOSIS — Z6825 Body mass index (BMI) 25.0-25.9, adult: Secondary | ICD-10-CM | POA: Diagnosis not present

## 2023-10-05 DIAGNOSIS — F17210 Nicotine dependence, cigarettes, uncomplicated: Secondary | ICD-10-CM | POA: Diagnosis not present

## 2023-10-05 DIAGNOSIS — Z79899 Other long term (current) drug therapy: Secondary | ICD-10-CM | POA: Diagnosis not present

## 2023-10-05 DIAGNOSIS — F1120 Opioid dependence, uncomplicated: Secondary | ICD-10-CM | POA: Diagnosis not present

## 2023-10-05 DIAGNOSIS — R102 Pelvic and perineal pain: Secondary | ICD-10-CM | POA: Diagnosis not present

## 2023-10-05 DIAGNOSIS — G8929 Other chronic pain: Secondary | ICD-10-CM | POA: Diagnosis not present

## 2023-10-07 DIAGNOSIS — Z79899 Other long term (current) drug therapy: Secondary | ICD-10-CM | POA: Diagnosis not present

## 2023-10-11 ENCOUNTER — Encounter (HOSPITAL_COMMUNITY): Payer: Self-pay | Admitting: Physician Assistant

## 2023-10-28 ENCOUNTER — Other Ambulatory Visit: Payer: Self-pay | Admitting: Physician Assistant

## 2023-10-28 DIAGNOSIS — N3011 Interstitial cystitis (chronic) with hematuria: Secondary | ICD-10-CM

## 2023-10-28 DIAGNOSIS — G894 Chronic pain syndrome: Secondary | ICD-10-CM

## 2023-10-28 DIAGNOSIS — F4321 Adjustment disorder with depressed mood: Secondary | ICD-10-CM

## 2023-10-31 DIAGNOSIS — R339 Retention of urine, unspecified: Secondary | ICD-10-CM | POA: Diagnosis not present

## 2023-11-03 DIAGNOSIS — G8929 Other chronic pain: Secondary | ICD-10-CM | POA: Diagnosis not present

## 2023-11-03 DIAGNOSIS — R102 Pelvic and perineal pain: Secondary | ICD-10-CM | POA: Diagnosis not present

## 2023-11-03 DIAGNOSIS — Z6825 Body mass index (BMI) 25.0-25.9, adult: Secondary | ICD-10-CM | POA: Diagnosis not present

## 2023-11-03 DIAGNOSIS — Z79891 Long term (current) use of opiate analgesic: Secondary | ICD-10-CM | POA: Diagnosis not present

## 2023-11-03 DIAGNOSIS — R300 Dysuria: Secondary | ICD-10-CM | POA: Diagnosis not present

## 2023-11-03 DIAGNOSIS — Z79899 Other long term (current) drug therapy: Secondary | ICD-10-CM | POA: Diagnosis not present

## 2023-11-21 ENCOUNTER — Other Ambulatory Visit: Payer: Self-pay | Admitting: Physician Assistant

## 2023-11-21 DIAGNOSIS — Z1231 Encounter for screening mammogram for malignant neoplasm of breast: Secondary | ICD-10-CM

## 2023-11-29 DIAGNOSIS — R339 Retention of urine, unspecified: Secondary | ICD-10-CM | POA: Diagnosis not present

## 2023-12-03 ENCOUNTER — Other Ambulatory Visit: Payer: Self-pay | Admitting: Physician Assistant

## 2023-12-05 DIAGNOSIS — M129 Arthropathy, unspecified: Secondary | ICD-10-CM | POA: Diagnosis not present

## 2023-12-05 DIAGNOSIS — Z6826 Body mass index (BMI) 26.0-26.9, adult: Secondary | ICD-10-CM | POA: Diagnosis not present

## 2023-12-05 DIAGNOSIS — F1120 Opioid dependence, uncomplicated: Secondary | ICD-10-CM | POA: Diagnosis not present

## 2023-12-05 DIAGNOSIS — R102 Pelvic and perineal pain: Secondary | ICD-10-CM | POA: Diagnosis not present

## 2023-12-05 DIAGNOSIS — Z79899 Other long term (current) drug therapy: Secondary | ICD-10-CM | POA: Diagnosis not present

## 2023-12-05 DIAGNOSIS — G8929 Other chronic pain: Secondary | ICD-10-CM | POA: Diagnosis not present

## 2023-12-07 ENCOUNTER — Ambulatory Visit

## 2023-12-08 DIAGNOSIS — Z79899 Other long term (current) drug therapy: Secondary | ICD-10-CM | POA: Diagnosis not present

## 2023-12-15 ENCOUNTER — Other Ambulatory Visit: Payer: Self-pay | Admitting: Physician Assistant

## 2023-12-15 DIAGNOSIS — R110 Nausea: Secondary | ICD-10-CM

## 2023-12-28 ENCOUNTER — Ambulatory Visit

## 2023-12-29 DIAGNOSIS — R339 Retention of urine, unspecified: Secondary | ICD-10-CM | POA: Diagnosis not present

## 2023-12-31 DIAGNOSIS — G8929 Other chronic pain: Secondary | ICD-10-CM | POA: Diagnosis not present

## 2023-12-31 DIAGNOSIS — Z79899 Other long term (current) drug therapy: Secondary | ICD-10-CM | POA: Diagnosis not present

## 2023-12-31 DIAGNOSIS — Z6827 Body mass index (BMI) 27.0-27.9, adult: Secondary | ICD-10-CM | POA: Diagnosis not present

## 2023-12-31 DIAGNOSIS — R102 Pelvic and perineal pain: Secondary | ICD-10-CM | POA: Diagnosis not present

## 2023-12-31 DIAGNOSIS — F1120 Opioid dependence, uncomplicated: Secondary | ICD-10-CM | POA: Diagnosis not present

## 2023-12-31 DIAGNOSIS — M5416 Radiculopathy, lumbar region: Secondary | ICD-10-CM | POA: Diagnosis not present

## 2024-01-02 DIAGNOSIS — M9901 Segmental and somatic dysfunction of cervical region: Secondary | ICD-10-CM | POA: Diagnosis not present

## 2024-01-02 DIAGNOSIS — M9903 Segmental and somatic dysfunction of lumbar region: Secondary | ICD-10-CM | POA: Diagnosis not present

## 2024-01-02 DIAGNOSIS — M9905 Segmental and somatic dysfunction of pelvic region: Secondary | ICD-10-CM | POA: Diagnosis not present

## 2024-01-02 DIAGNOSIS — M9902 Segmental and somatic dysfunction of thoracic region: Secondary | ICD-10-CM | POA: Diagnosis not present

## 2024-01-02 DIAGNOSIS — M9904 Segmental and somatic dysfunction of sacral region: Secondary | ICD-10-CM | POA: Diagnosis not present

## 2024-01-03 DIAGNOSIS — M9904 Segmental and somatic dysfunction of sacral region: Secondary | ICD-10-CM | POA: Diagnosis not present

## 2024-01-03 DIAGNOSIS — M9901 Segmental and somatic dysfunction of cervical region: Secondary | ICD-10-CM | POA: Diagnosis not present

## 2024-01-03 DIAGNOSIS — M9903 Segmental and somatic dysfunction of lumbar region: Secondary | ICD-10-CM | POA: Diagnosis not present

## 2024-01-03 DIAGNOSIS — M9902 Segmental and somatic dysfunction of thoracic region: Secondary | ICD-10-CM | POA: Diagnosis not present

## 2024-01-03 DIAGNOSIS — M9905 Segmental and somatic dysfunction of pelvic region: Secondary | ICD-10-CM | POA: Diagnosis not present

## 2024-01-04 DIAGNOSIS — Z79899 Other long term (current) drug therapy: Secondary | ICD-10-CM | POA: Diagnosis not present

## 2024-01-06 DIAGNOSIS — M9903 Segmental and somatic dysfunction of lumbar region: Secondary | ICD-10-CM | POA: Diagnosis not present

## 2024-01-06 DIAGNOSIS — M9901 Segmental and somatic dysfunction of cervical region: Secondary | ICD-10-CM | POA: Diagnosis not present

## 2024-01-06 DIAGNOSIS — M9904 Segmental and somatic dysfunction of sacral region: Secondary | ICD-10-CM | POA: Diagnosis not present

## 2024-01-06 DIAGNOSIS — M9902 Segmental and somatic dysfunction of thoracic region: Secondary | ICD-10-CM | POA: Diagnosis not present

## 2024-01-09 DIAGNOSIS — M9902 Segmental and somatic dysfunction of thoracic region: Secondary | ICD-10-CM | POA: Diagnosis not present

## 2024-01-09 DIAGNOSIS — M9904 Segmental and somatic dysfunction of sacral region: Secondary | ICD-10-CM | POA: Diagnosis not present

## 2024-01-09 DIAGNOSIS — M9903 Segmental and somatic dysfunction of lumbar region: Secondary | ICD-10-CM | POA: Diagnosis not present

## 2024-01-09 DIAGNOSIS — M9901 Segmental and somatic dysfunction of cervical region: Secondary | ICD-10-CM | POA: Diagnosis not present

## 2024-01-11 DIAGNOSIS — M9904 Segmental and somatic dysfunction of sacral region: Secondary | ICD-10-CM | POA: Diagnosis not present

## 2024-01-11 DIAGNOSIS — M9903 Segmental and somatic dysfunction of lumbar region: Secondary | ICD-10-CM | POA: Diagnosis not present

## 2024-01-11 DIAGNOSIS — M9901 Segmental and somatic dysfunction of cervical region: Secondary | ICD-10-CM | POA: Diagnosis not present

## 2024-01-11 DIAGNOSIS — M9902 Segmental and somatic dysfunction of thoracic region: Secondary | ICD-10-CM | POA: Diagnosis not present

## 2024-01-12 ENCOUNTER — Ambulatory Visit

## 2024-01-13 DIAGNOSIS — M9903 Segmental and somatic dysfunction of lumbar region: Secondary | ICD-10-CM | POA: Diagnosis not present

## 2024-01-13 DIAGNOSIS — M9902 Segmental and somatic dysfunction of thoracic region: Secondary | ICD-10-CM | POA: Diagnosis not present

## 2024-01-13 DIAGNOSIS — M9904 Segmental and somatic dysfunction of sacral region: Secondary | ICD-10-CM | POA: Diagnosis not present

## 2024-01-13 DIAGNOSIS — M9901 Segmental and somatic dysfunction of cervical region: Secondary | ICD-10-CM | POA: Diagnosis not present

## 2024-01-16 DIAGNOSIS — M9904 Segmental and somatic dysfunction of sacral region: Secondary | ICD-10-CM | POA: Diagnosis not present

## 2024-01-16 DIAGNOSIS — M9901 Segmental and somatic dysfunction of cervical region: Secondary | ICD-10-CM | POA: Diagnosis not present

## 2024-01-16 DIAGNOSIS — M9902 Segmental and somatic dysfunction of thoracic region: Secondary | ICD-10-CM | POA: Diagnosis not present

## 2024-01-16 DIAGNOSIS — M9903 Segmental and somatic dysfunction of lumbar region: Secondary | ICD-10-CM | POA: Diagnosis not present

## 2024-01-18 DIAGNOSIS — M9901 Segmental and somatic dysfunction of cervical region: Secondary | ICD-10-CM | POA: Diagnosis not present

## 2024-01-18 DIAGNOSIS — M9904 Segmental and somatic dysfunction of sacral region: Secondary | ICD-10-CM | POA: Diagnosis not present

## 2024-01-18 DIAGNOSIS — M9903 Segmental and somatic dysfunction of lumbar region: Secondary | ICD-10-CM | POA: Diagnosis not present

## 2024-01-18 DIAGNOSIS — M9902 Segmental and somatic dysfunction of thoracic region: Secondary | ICD-10-CM | POA: Diagnosis not present

## 2024-01-20 DIAGNOSIS — M9903 Segmental and somatic dysfunction of lumbar region: Secondary | ICD-10-CM | POA: Diagnosis not present

## 2024-01-20 DIAGNOSIS — M9902 Segmental and somatic dysfunction of thoracic region: Secondary | ICD-10-CM | POA: Diagnosis not present

## 2024-01-20 DIAGNOSIS — M9904 Segmental and somatic dysfunction of sacral region: Secondary | ICD-10-CM | POA: Diagnosis not present

## 2024-01-20 DIAGNOSIS — M9901 Segmental and somatic dysfunction of cervical region: Secondary | ICD-10-CM | POA: Diagnosis not present

## 2024-01-23 DIAGNOSIS — M9903 Segmental and somatic dysfunction of lumbar region: Secondary | ICD-10-CM | POA: Diagnosis not present

## 2024-01-23 DIAGNOSIS — M9904 Segmental and somatic dysfunction of sacral region: Secondary | ICD-10-CM | POA: Diagnosis not present

## 2024-01-23 DIAGNOSIS — M9901 Segmental and somatic dysfunction of cervical region: Secondary | ICD-10-CM | POA: Diagnosis not present

## 2024-01-23 DIAGNOSIS — M9902 Segmental and somatic dysfunction of thoracic region: Secondary | ICD-10-CM | POA: Diagnosis not present

## 2024-01-25 DIAGNOSIS — M9901 Segmental and somatic dysfunction of cervical region: Secondary | ICD-10-CM | POA: Diagnosis not present

## 2024-01-25 DIAGNOSIS — M9902 Segmental and somatic dysfunction of thoracic region: Secondary | ICD-10-CM | POA: Diagnosis not present

## 2024-01-25 DIAGNOSIS — M9903 Segmental and somatic dysfunction of lumbar region: Secondary | ICD-10-CM | POA: Diagnosis not present

## 2024-01-25 DIAGNOSIS — M9904 Segmental and somatic dysfunction of sacral region: Secondary | ICD-10-CM | POA: Diagnosis not present

## 2024-01-27 DIAGNOSIS — M9901 Segmental and somatic dysfunction of cervical region: Secondary | ICD-10-CM | POA: Diagnosis not present

## 2024-01-27 DIAGNOSIS — M9902 Segmental and somatic dysfunction of thoracic region: Secondary | ICD-10-CM | POA: Diagnosis not present

## 2024-01-27 DIAGNOSIS — M9904 Segmental and somatic dysfunction of sacral region: Secondary | ICD-10-CM | POA: Diagnosis not present

## 2024-01-27 DIAGNOSIS — M9903 Segmental and somatic dysfunction of lumbar region: Secondary | ICD-10-CM | POA: Diagnosis not present

## 2024-01-30 DIAGNOSIS — R102 Pelvic and perineal pain: Secondary | ICD-10-CM | POA: Diagnosis not present

## 2024-01-30 DIAGNOSIS — Z79899 Other long term (current) drug therapy: Secondary | ICD-10-CM | POA: Diagnosis not present

## 2024-01-30 DIAGNOSIS — F17210 Nicotine dependence, cigarettes, uncomplicated: Secondary | ICD-10-CM | POA: Diagnosis not present

## 2024-01-30 DIAGNOSIS — F1120 Opioid dependence, uncomplicated: Secondary | ICD-10-CM | POA: Diagnosis not present

## 2024-01-30 DIAGNOSIS — G8929 Other chronic pain: Secondary | ICD-10-CM | POA: Diagnosis not present

## 2024-01-30 DIAGNOSIS — Z6828 Body mass index (BMI) 28.0-28.9, adult: Secondary | ICD-10-CM | POA: Diagnosis not present

## 2024-01-31 DIAGNOSIS — M9901 Segmental and somatic dysfunction of cervical region: Secondary | ICD-10-CM | POA: Diagnosis not present

## 2024-01-31 DIAGNOSIS — M9902 Segmental and somatic dysfunction of thoracic region: Secondary | ICD-10-CM | POA: Diagnosis not present

## 2024-01-31 DIAGNOSIS — M9904 Segmental and somatic dysfunction of sacral region: Secondary | ICD-10-CM | POA: Diagnosis not present

## 2024-01-31 DIAGNOSIS — M9903 Segmental and somatic dysfunction of lumbar region: Secondary | ICD-10-CM | POA: Diagnosis not present

## 2024-02-02 ENCOUNTER — Telehealth: Payer: Self-pay

## 2024-02-02 NOTE — Telephone Encounter (Signed)
 Lab work is not usually done with an AWV - should I let this patient know that labs will not be drawn for this visit?

## 2024-02-02 NOTE — Telephone Encounter (Signed)
 Copied from CRM #[id number]. Topic: Clinical - Request for Lab/Test Order >> Feb 02, 2024 11:38 AM Zane F wrote: Reason for CRM:   Patient is looking to have all routine labs placed for her upcoming aws visit on sept 5th.   Please place labs and contact the patient if the provider would like them scheduled ahead of time.   Callback Number: [phone]

## 2024-02-03 DIAGNOSIS — M9904 Segmental and somatic dysfunction of sacral region: Secondary | ICD-10-CM | POA: Diagnosis not present

## 2024-02-03 DIAGNOSIS — M9902 Segmental and somatic dysfunction of thoracic region: Secondary | ICD-10-CM | POA: Diagnosis not present

## 2024-02-03 DIAGNOSIS — M9901 Segmental and somatic dysfunction of cervical region: Secondary | ICD-10-CM | POA: Diagnosis not present

## 2024-02-03 DIAGNOSIS — M9903 Segmental and somatic dysfunction of lumbar region: Secondary | ICD-10-CM | POA: Diagnosis not present

## 2024-02-03 NOTE — Telephone Encounter (Signed)
 Will you call her and explain what a AMV is and then say can schedule a CPE.

## 2024-02-03 NOTE — Telephone Encounter (Signed)
 Attempted call to patient. Left a voice mail message requesting a return call.

## 2024-02-06 DIAGNOSIS — M9902 Segmental and somatic dysfunction of thoracic region: Secondary | ICD-10-CM | POA: Diagnosis not present

## 2024-02-06 DIAGNOSIS — M9903 Segmental and somatic dysfunction of lumbar region: Secondary | ICD-10-CM | POA: Diagnosis not present

## 2024-02-06 DIAGNOSIS — M9901 Segmental and somatic dysfunction of cervical region: Secondary | ICD-10-CM | POA: Diagnosis not present

## 2024-02-06 DIAGNOSIS — M9904 Segmental and somatic dysfunction of sacral region: Secondary | ICD-10-CM | POA: Diagnosis not present

## 2024-02-06 NOTE — Telephone Encounter (Signed)
 Spoke with patient. Changed the 02/17/2024 visit to regular OV - for discussion about bloodwork from another provider.  Transferred to front desk staff to re-schedule the AWV .

## 2024-02-07 DIAGNOSIS — Z79899 Other long term (current) drug therapy: Secondary | ICD-10-CM | POA: Diagnosis not present

## 2024-02-10 DIAGNOSIS — M9901 Segmental and somatic dysfunction of cervical region: Secondary | ICD-10-CM | POA: Diagnosis not present

## 2024-02-10 DIAGNOSIS — M9902 Segmental and somatic dysfunction of thoracic region: Secondary | ICD-10-CM | POA: Diagnosis not present

## 2024-02-10 DIAGNOSIS — M9904 Segmental and somatic dysfunction of sacral region: Secondary | ICD-10-CM | POA: Diagnosis not present

## 2024-02-10 DIAGNOSIS — M9903 Segmental and somatic dysfunction of lumbar region: Secondary | ICD-10-CM | POA: Diagnosis not present

## 2024-02-14 DIAGNOSIS — M9903 Segmental and somatic dysfunction of lumbar region: Secondary | ICD-10-CM | POA: Diagnosis not present

## 2024-02-14 DIAGNOSIS — M9904 Segmental and somatic dysfunction of sacral region: Secondary | ICD-10-CM | POA: Diagnosis not present

## 2024-02-14 DIAGNOSIS — M9902 Segmental and somatic dysfunction of thoracic region: Secondary | ICD-10-CM | POA: Diagnosis not present

## 2024-02-14 DIAGNOSIS — M9901 Segmental and somatic dysfunction of cervical region: Secondary | ICD-10-CM | POA: Diagnosis not present

## 2024-02-16 ENCOUNTER — Ambulatory Visit (INDEPENDENT_AMBULATORY_CARE_PROVIDER_SITE_OTHER)

## 2024-02-16 ENCOUNTER — Encounter

## 2024-02-16 VITALS — Ht 65.0 in | Wt 164.0 lb

## 2024-02-16 DIAGNOSIS — Z0000 Encounter for general adult medical examination without abnormal findings: Secondary | ICD-10-CM | POA: Diagnosis not present

## 2024-02-16 NOTE — Progress Notes (Signed)
 Subjective:   [name] is a 58 y.o. female who presents for Medicare Annual (Subsequent) preventive examination.  Visit Complete: Virtual I connected with  [name] on 02/16/24 by a audio enabled telemedicine application and verified that I am speaking with the correct person using two identifiers.  Patient Location: Home  Provider Location: Office/Clinic  I discussed the limitations of evaluation and management by telemedicine. The patient expressed understanding and agreed to proceed.  Vital Signs: Because this visit was a virtual/telehealth visit, some criteria may be missing or patient reported. Any vitals not documented were not able to be obtained and vitals that have been documented are patient reported.  Patient Medicare AWV questionnaire was completed by the patient on n/a; I have confirmed that all information answered by patient is correct and no changes since this date.  Cardiac Risk Factors include: hypertension;smoking/ tobacco exposure     Objective:    Today's Vitals   02/16/24 1452  Weight: 164 lb (74.4 kg)  Height: 5' 5 (1.651 m)   Body mass index is 27.29 kg/m.     02/16/2024    3:03 PM 03/24/2022   10:54 AM 06/10/2021   11:00 AM 06/09/2021    7:55 PM 03/16/2021    1:15 PM 11/22/2016    8:51 AM 12/11/2012    7:25 AM  Advanced Directives  Does Patient Have a Medical Advance Directive? Yes Yes Yes Yes Yes Yes  Patient does not have advance directive;Patient would not like information   Type of Public librarian Power of [place];Living will Living will Living will;Healthcare Power of Attorney Living will;Healthcare Power of Attorney Living will;Healthcare Power of Attorney Healthcare Power of Attorney   Does patient want to make changes to medical advance directive? No - Patient declined No - Patient declined No - Patient declined  No - Patient declined    Copy of Healthcare Power of Attorney in Chart?   No - copy requested  No -  copy requested Yes    Pre-existing out of facility DNR order (yellow form or pink MOST form)       No      Data saved with a previous flowsheet row definition    Current Medications (verified) Outpatient Encounter Medications as of 02/16/2024  Medication Sig   atenolol  (TENORMIN ) 50 MG tablet TAKE ONE-HALF TABLET BY MOUTH ONCE DAILY   DULoxetine  (CYMBALTA ) 30 MG capsule TAKE THREE CAPSULES BY MOUTH EVERYDAY   FLUoxetine  (PROZAC ) 10 MG capsule TAKE 1 CAPSULE BY MOUTH EVERY DAY   hydrOXYzine  (ATARAX /VISTARIL ) 25 MG tablet Take 25 mg by mouth 3 (three) times daily as needed for anxiety.   lidocaine  (XYLOCAINE ) 2 % injection 10 mLs by Other route See admin instructions. Once to twice daily as needed for pain   linaclotide  (LINZESS ) 290 MCG CAPS capsule Take 1 capsule (290 mcg total) by mouth daily before breakfast. (Patient taking differently: Take 290 mcg by mouth daily as needed.)   morphine  (MS CONTIN ) 60 MG 12 hr tablet Take 60 mg by mouth every 12 (twelve) hours.   pantoprazole  (PROTONIX ) 40 MG tablet TAKE 1 TABLET DAILY   promethazine  (PHENERGAN ) 25 MG tablet TAKE 1 TABLET DAILY AS     NEEDED FOR NAUSEA   fluticasone  (FLONASE ) 50 MCG/ACT nasal spray SPRAY 2 SPRAYS INTO EACH NOSTRIL EVERY DAY (Patient not taking: Reported on 02/16/2024)   potassium chloride  (KLOR-CON  M) 10 MEQ tablet TAKE 1 TABLET BY MOUTH EVERY DAY (Patient not taking: Reported on  02/16/2024)   pramipexole  (MIRAPEX ) 0.125 MG tablet TAKE ONE TABLET BY MOUTH AT BEDTIME AS NEEDED FOR RESTLESS LEGS (Patient not taking: Reported on 02/16/2024)   [DISCONTINUED] amoxicillin -clavulanate (AUGMENTIN ) 875-125 MG tablet Take 1 tablet by mouth 2 (two) times daily.   [DISCONTINUED] nitrofurantoin  (MACRODANTIN ) 50 MG capsule Take 50 mg by mouth daily.   No facility-administered encounter medications on file as of 02/16/2024.    Allergies (verified) Gabapentin, Lyrica  [pregabalin ], Ambien [zolpidem], Amitriptyline, Bactrim   [sulfamethoxazole -trimethoprim ], and Oxycodone    History: Past Medical History:  Diagnosis Date   Anxiety    Cancer (HCC)    cervical   Colitis    lymphacitic   Depression    Dysrhythmia    pvc    Gastric ulcer    Headache(784.0)    Heart murmur    IBS (irritable bowel syndrome)    Interstitial cystitis    MVP (mitral valve prolapse)    Urinary calculus or stone    Past Surgical History:  Procedure Laterality Date   APPENDECTOMY  06/15/2003   BILATERAL OOPHORECTOMY  06/14/2010   DILATION AND CURETTAGE OF UTERUS     x 4  one after each SAB   EXPLORATORY LAPAROTOMY  06/14/2005   LAPAROSCOPY N/A 08/04/2012   Procedure: LAPAROSCOPY DIAGNOSTIC;  Surgeon: Camellia CHRISTELLA Blush, MD,FACS;  Location: [facility];  Service: General;  Laterality: N/A;   TONSILLECTOMY  30 years ago   TOTAL ABDOMINAL HYSTERECTOMY  06/15/2003   TUBAL LIGATION  06/14/1998   Family History  Problem Relation Age of Onset   Heart attack Mother    Cirrhosis Mother    Cancer Father        tongue   Cirrhosis Father    Cancer Paternal Uncle    Colon cancer Neg Hx    Esophageal cancer Neg Hx    Rectal cancer Neg Hx    Stomach cancer Neg Hx    Breast cancer Neg Hx    Social History   Socioeconomic History   Marital status: Married    Spouse name: Ozell   Number of children: 2   Years of education: 12   Highest education level: 12th grade  Occupational History   Occupation: Legally disabled  Tobacco Use   Smoking status: Every Day    Current packs/day: 1.00    Average packs/day: 1 pack/day for 45.1 years (45.1 ttl pk-yrs)    Types: Cigarettes    Start date: 01/19/1979   Smokeless tobacco: Never   Tobacco comments:    03/24/22 Started smoking 0.5 pack per day since October, 2022.  Vaping Use   Vaping status: Never Used  Substance and Sexual Activity   Alcohol use: Yes    Comment: rarely   Drug use: No   Sexual activity: Yes    Partners: Male  Other Topics Concern   Not on file  Social History  Narrative   Lives with her husband. She has two children. She enjoys collecting gemstones and selling them.   Social Drivers of Corporate investment banker Strain: Low Risk  (02/16/2024)   Overall Financial Resource Strain (CARDIA)    Difficulty of Paying Living Expenses: Not hard at all  Food Insecurity: No Food Insecurity (02/16/2024)   Hunger Vital Sign    Worried About Running Out of Food in the Last Year: Never true    Ran Out of Food in the Last Year: Never true  Transportation Needs: No Transportation Needs (02/16/2024)   PRAPARE - Transportation    Lack  of Transportation (Medical): No    Lack of Transportation (Non-Medical): No  Physical Activity: Insufficiently Active (02/16/2024)   Exercise Vital Sign    Days of Exercise per Week: 7 days    Minutes of Exercise per Session: 20 min  Stress: No Stress Concern Present (02/16/2024)   [employer] of Occupational Health - Occupational Stress Questionnaire    Feeling of Stress: Not at all  Social Connections: Moderately Integrated (02/16/2024)   Social Connection and Isolation Panel    Frequency of Communication with Friends and Family: More than three times a week    Frequency of Social Gatherings with Friends and Family: Twice a week    Attends Religious Services: Never    Database administrator or Organizations: Yes    Attends Engineer, structural: More than 4 times per year    Marital Status: Married    Tobacco Counseling Ready to quit: Not Answered Counseling given: Not Answered Tobacco comments: 03/24/22 Started smoking 0.5 pack per day since October, 2022.   Clinical Intake:  Pre-visit preparation completed: Yes  Pain : No/denies pain     BMI - recorded: 27.29 Nutritional Status: BMI 25 -29 Overweight Nutritional Risks: None Diabetes: No  How often do you need to have someone help you when you read instructions, pamphlets, or other written materials from your doctor or pharmacy?: 1 - Never What is the  last grade level you completed in school?: 12  Interpreter Needed?: No      Activities of Daily Living    02/16/2024    2:53 PM  In your present state of health, do you have any difficulty performing the following activities:  Hearing? 0  Vision? 0  Difficulty concentrating or making decisions? 1  Walking or climbing stairs? 0  Dressing or bathing? 0  Doing errands, shopping? 0  Preparing Food and eating ? N  Using the Toilet? N  In the past six months, have you accidently leaked urine? Y  Do you have problems with loss of bowel control? N  Managing your Medications? N  Managing your Finances? N  Housekeeping or managing your Housekeeping? N    Patient Care Team: Breeback, Jade L, PA-C as PCP - General (Family Medicine) Dia Kathryn E, PA-C (Physician Assistant) Janit Lamar PARAS, MD (Urology)  Indicate any recent Medical Services you may have received from other than [facility] providers in the past year (date may be approximate).     Assessment:   This is a routine wellness examination for [name].  Hearing/Vision screen No results found.   Goals Addressed             This Visit's Progress    Patient Stated       Patient states she would like to continue a healthy lifestyle.        Depression Screen    02/16/2024    3:02 PM 02/18/2023   12:43 PM 10/25/2022    1:03 PM 10/25/2022   10:52 AM 09/27/2022   11:34 AM 03/24/2022   10:47 AM 08/17/2021   10:42 AM  PHQ 2/9 Scores  PHQ - 2 Score 0 4 3 2 6 1 3   PHQ- 9 Score  9 5  18  8     Fall Risk    02/16/2024    3:04 PM 02/18/2023    9:23 AM 03/24/2022   10:45 AM 02/26/2022   11:01 AM 08/17/2021   10:42 AM  Fall Risk   Falls in the past year?  0 0 0 1 0  Number falls in past yr: 0 0 0 1 0  Injury with Fall?  0 0 1 0  Risk for fall due to : No Fall Risks No Fall Risks No Fall Risks History of fall(s) No Fall Risks  Follow up Falls evaluation completed Falls evaluation completed Falls evaluation completed  Falls  evaluation completed  Falls evaluation completed      Data saved with a previous flowsheet row definition    MEDICARE RISK AT HOME: Medicare Risk at Home Any stairs in or around the home?: Yes If so, are there any without handrails?: Yes Home free of loose throw rugs in walkways, pet beds, electrical cords, etc?: Yes Adequate lighting in your home to reduce risk of falls?: Yes Life alert?: No Use of a cane, walker or w/c?: No Grab bars in the bathroom?: No Shower chair or bench in shower?: Yes Elevated toilet seat or a handicapped toilet?: No  TIMED UP AND GO:  Was the test performed?  No    Cognitive Function:      02/26/2022   11:12 AM  Montreal Cognitive Assessment   Visuospatial/ Executive (0/5) 3  Naming (0/3) 2  Attention: Read list of digits (0/2) 2  Attention: Read list of letters (0/1) 1  Attention: Serial 7 subtraction starting at 100 (0/3) 3  Language: Repeat phrase (0/2) 0  Language : Fluency (0/1) 0  Abstraction (0/2) 2  Delayed Recall (0/5) 4  Orientation (0/6) 5  Total 22  Adjusted Score (based on education) 23      02/16/2024    3:05 PM 03/24/2022   10:54 AM 03/16/2021    1:27 PM  6CIT Screen  What Year? 0 points 0 points 0 points  What month? 0 points 0 points 0 points  What time? 0 points 0 points 0 points  Count back from 20 0 points 0 points 0 points  Months in reverse 0 points 0 points 0 points  Repeat phrase 0 points 0 points 0 points  Total Score 0 points 0 points 0 points    Immunizations Immunization History  Administered Date(s) Administered   Influenza, Seasonal, Injecte, Preservative Fre 05/20/2023   Influenza,inj,Quad PF,6+ Mos 02/24/2015, 03/22/2016, 03/16/2017, 04/18/2018, 03/17/2021   Influenza,inj,quad, With Preservative 03/22/2016   Influenza-Unspecified 06/10/2012, 01/27/2022   PPD Test 05/29/2012   Td 05/16/2009   Tdap 09/08/2011    TDAP status: Due, Education has been provided regarding the importance of this vaccine.  Advised may receive this vaccine at local pharmacy or Health Dept. Aware to provide a copy of the vaccination record if obtained from local pharmacy or Health Dept. Verbalized acceptance and understanding.  Flu Vaccine status: Due, Education has been provided regarding the importance of this vaccine. Advised may receive this vaccine at local pharmacy or Health Dept. Aware to provide a copy of the vaccination record if obtained from local pharmacy or Health Dept. Verbalized acceptance and understanding.  Pneumococcal vaccine status: Declined,  Education has been provided regarding the importance of this vaccine but patient still declined. Advised may receive this vaccine at local pharmacy or Health Dept. Aware to provide a copy of the vaccination record if obtained from local pharmacy or Health Dept. Verbalized acceptance and understanding.   Covid-19 vaccine status: Declined, Education has been provided regarding the importance of this vaccine but patient still declined. Advised may receive this vaccine at local pharmacy or Health Dept.or vaccine clinic. Aware to provide a copy of the vaccination record  if obtained from local pharmacy or Health Dept. Verbalized acceptance and understanding.  Qualifies for Shingles Vaccine? Yes   Zostavax completed No   Shingrix Completed?: No.    Education has been provided regarding the importance of this vaccine. Patient has been advised to call insurance company to determine out of pocket expense if they have not yet received this vaccine. Advised may also receive vaccine at local pharmacy or Health Dept. Verbalized acceptance and understanding.  Screening Tests Health Maintenance  Topic Date Due   Pneumococcal Vaccine: 50+ Years (1 of 2 - PCV) Never done   Hepatitis B Vaccines 19-59 Average Risk (1 of 3 - 19+ 3-dose series) Never done   Lung Cancer Screening  Never done   DTaP/Tdap/Td (3 - Td or Tdap) 09/07/2021   INFLUENZA VACCINE  01/13/2024   MAMMOGRAM   10/05/2024   Medicare Annual Wellness (AWV)  02/15/2025   Colonoscopy  04/02/2025   Hepatitis C Screening  Completed   HIV Screening  Completed   HPV VACCINES  Aged Out   Meningococcal B Vaccine  Aged Out   COVID-19 Vaccine  Discontinued   Zoster Vaccines- Shingrix  Discontinued    Health Maintenance  Health Maintenance Due  Topic Date Due   Pneumococcal Vaccine: 50+ Years (1 of 2 - PCV) Never done   Hepatitis B Vaccines 19-59 Average Risk (1 of 3 - 19+ 3-dose series) Never done   Lung Cancer Screening  Never done   DTaP/Tdap/Td (3 - Td or Tdap) 09/07/2021   INFLUENZA VACCINE  01/13/2024    Colorectal cancer screening: Type of screening: Colonoscopy. Completed 04/03/2015. Repeat every 10 years  Mammogram status: Completed 10/06/2022. Repeat every year   Lung Cancer Screening: (Low Dose CT Chest recommended if Age 71-80 years, 20 pack-year currently smoking OR have quit w/in 15years.) does qualify.   Lung Cancer Screening Referral: Patient declined.   Additional Screening:  Hepatitis C Screening: does qualify; Completed 08/29/2020  Vision Screening: Recommended annual ophthalmology exams for early detection of glaucoma and other disorders of the eye. Is the patient up to date with their annual eye exam?  Yes  Who is the provider or what is the name of the office in which the patient attends annual eye exams? [facility] If pt is not established with a provider, would they like to be referred to a provider to establish care? N/a.   Dental Screening: Recommended annual dental exams for proper oral hygiene   Community Resource Referral / Chronic Care Management: CRR required this visit?  No   CCM required this visit?  No     Plan:     I have personally reviewed and noted the following in the patient's chart:   Medical and social history Use of alcohol, tobacco or illicit drugs  Current medications and supplements including opioid prescriptions. Patient is not  currently taking opioid prescriptions. Functional ability and status Nutritional status Physical activity Advanced directives List of other physicians Hospitalizations # 1, surgeries # 0, and ER # 1 visits in previous 12 months Vitals Screenings to include cognitive, depression, and falls Referrals and appointments  In addition, I have reviewed and discussed with patient certain preventive protocols, quality metrics, and best practice recommendations. A written personalized care plan for preventive services as well as general preventive health recommendations were provided to patient.     [name], CMA   02/16/2024   After Visit Summary: (MyChart) Due to this being a telephonic visit, the after visit  summary with patients personalized plan was offered to patient via MyChart   Nurse Notes:   [name] is a 58 y.o. female patient of [name], Shelly CROME, PA-C who had a [employer] Visit today via telephone. [name] is Disabled and lives with her spouse. She reports that she is socially active and does interact with friends/family regularly. She is minimally physically active and enjoys collecting gemstones and selling them.

## 2024-02-16 NOTE — Patient Instructions (Signed)
 [name] , Thank you for taking time to come for your Medicare Wellness Visit. I appreciate your ongoing commitment to your health goals. Please review the following plan we discussed and let me know if I can assist you in the future.   These are the goals we discussed:  Goals       Patient Stated (pt-stated)      03/16/2021 AWV Goal: Exercise for General Health  Patient will verbalize understanding of the benefits of increased physical activity: Exercising regularly is important. It will improve your overall fitness, flexibility, and endurance. Regular exercise also will improve your overall health. It can help you control your weight, reduce stress, and improve your bone density. Over the next year, patient will increase physical activity as tolerated with a goal of at least 150 minutes of moderate physical activity per week.  You can tell that you are exercising at a moderate intensity if your heart starts beating faster and you start breathing faster but can still hold a conversation. Moderate-intensity exercise ideas include: Walking 1 mile (1.6 km) in about 15 minutes Biking Hiking Golfing Dancing Water aerobics Patient will verbalize understanding of everyday activities that increase physical activity by providing examples like the following: Yard work, such as: Insurance underwriter Gardening Washing windows or floors Patient will be able to explain general safety guidelines for exercising:  Before you start a new exercise program, talk with your health care provider. Do not exercise so much that you hurt yourself, feel dizzy, or get very short of breath. Wear comfortable clothes and wear shoes with good support. Drink plenty of water while you exercise to prevent dehydration or heat stroke. Work out until your breathing and your heartbeat get faster.       Patient Stated (pt-stated)       03/24/2022 AWV Goal: Exercise for General Health  Patient will verbalize understanding of the benefits of increased physical activity: Exercising regularly is important. It will improve your overall fitness, flexibility, and endurance. Regular exercise also will improve your overall health. It can help you control your weight, reduce stress, and improve your bone density. Over the next year, patient will increase physical activity as tolerated with a goal of at least 150 minutes of moderate physical activity per week.  You can tell that you are exercising at a moderate intensity if your heart starts beating faster and you start breathing faster but can still hold a conversation. Moderate-intensity exercise ideas include: Walking 1 mile (1.6 km) in about 15 minutes Biking Hiking Golfing Dancing Water aerobics Patient will verbalize understanding of everyday activities that increase physical activity by providing examples like the following: Yard work, such as: Insurance underwriter Gardening Washing windows or floors Patient will be able to explain general safety guidelines for exercising:  Before you start a new exercise program, talk with your health care provider. Do not exercise so much that you hurt yourself, feel dizzy, or get very short of breath. Wear comfortable clothes and wear shoes with good support. Drink plenty of water while you exercise to prevent dehydration or heat stroke. Work out until your breathing and your heartbeat get faster.       Patient Stated      Patient states she would like to continue a healthy lifestyle.         This  is a list of the screening recommended for you and due dates:  Health Maintenance  Topic Date Due   Pneumococcal Vaccine for age over 12 (1 of 2 - PCV) Never done   Hepatitis B Vaccine (1 of 3 - 19+ 3-dose series) Never done   Screening for Lung Cancer   Never done   DTaP/Tdap/Td vaccine (3 - Td or Tdap) 09/07/2021   Flu Shot  01/13/2024   Mammogram  10/05/2024   Medicare Annual Wellness Visit  02/15/2025   Colon Cancer Screening  04/02/2025   Hepatitis C Screening  Completed   HIV Screening  Completed   HPV Vaccine  Aged Out   Meningitis B Vaccine  Aged Out   COVID-19 Vaccine  Discontinued   Zoster (Shingles) Vaccine  Discontinued

## 2024-02-17 ENCOUNTER — Ambulatory Visit (INDEPENDENT_AMBULATORY_CARE_PROVIDER_SITE_OTHER): Admitting: Physician Assistant

## 2024-02-17 VITALS — BP 99/77 | HR 70 | Wt 160.0 lb

## 2024-02-17 DIAGNOSIS — M9903 Segmental and somatic dysfunction of lumbar region: Secondary | ICD-10-CM | POA: Diagnosis not present

## 2024-02-17 DIAGNOSIS — Z13820 Encounter for screening for osteoporosis: Secondary | ICD-10-CM

## 2024-02-17 DIAGNOSIS — Z8719 Personal history of other diseases of the digestive system: Secondary | ICD-10-CM

## 2024-02-17 DIAGNOSIS — M9901 Segmental and somatic dysfunction of cervical region: Secondary | ICD-10-CM | POA: Diagnosis not present

## 2024-02-17 DIAGNOSIS — Z23 Encounter for immunization: Secondary | ICD-10-CM

## 2024-02-17 DIAGNOSIS — Z9189 Other specified personal risk factors, not elsewhere classified: Secondary | ICD-10-CM | POA: Diagnosis not present

## 2024-02-17 DIAGNOSIS — F1011 Alcohol abuse, in remission: Secondary | ICD-10-CM

## 2024-02-17 DIAGNOSIS — M9904 Segmental and somatic dysfunction of sacral region: Secondary | ICD-10-CM | POA: Diagnosis not present

## 2024-02-17 DIAGNOSIS — M2550 Pain in unspecified joint: Secondary | ICD-10-CM | POA: Diagnosis not present

## 2024-02-17 DIAGNOSIS — R768 Other specified abnormal immunological findings in serum: Secondary | ICD-10-CM | POA: Insufficient documentation

## 2024-02-17 DIAGNOSIS — M9902 Segmental and somatic dysfunction of thoracic region: Secondary | ICD-10-CM | POA: Diagnosis not present

## 2024-02-17 NOTE — Patient Instructions (Signed)
 Get bone density Will call with lab results

## 2024-02-17 NOTE — Progress Notes (Signed)
 SABRA

## 2024-02-20 ENCOUNTER — Encounter: Payer: Self-pay | Admitting: Physician Assistant

## 2024-02-20 ENCOUNTER — Ambulatory Visit: Payer: Self-pay | Admitting: Physician Assistant

## 2024-02-20 DIAGNOSIS — M9903 Segmental and somatic dysfunction of lumbar region: Secondary | ICD-10-CM | POA: Diagnosis not present

## 2024-02-20 DIAGNOSIS — M9901 Segmental and somatic dysfunction of cervical region: Secondary | ICD-10-CM | POA: Diagnosis not present

## 2024-02-20 DIAGNOSIS — Z9189 Other specified personal risk factors, not elsewhere classified: Secondary | ICD-10-CM | POA: Insufficient documentation

## 2024-02-20 DIAGNOSIS — M8588 Other specified disorders of bone density and structure, other site: Secondary | ICD-10-CM

## 2024-02-20 DIAGNOSIS — Z13820 Encounter for screening for osteoporosis: Secondary | ICD-10-CM | POA: Insufficient documentation

## 2024-02-20 DIAGNOSIS — R768 Other specified abnormal immunological findings in serum: Secondary | ICD-10-CM

## 2024-02-20 DIAGNOSIS — M9904 Segmental and somatic dysfunction of sacral region: Secondary | ICD-10-CM | POA: Diagnosis not present

## 2024-02-20 DIAGNOSIS — Z8261 Family history of arthritis: Secondary | ICD-10-CM | POA: Insufficient documentation

## 2024-02-20 DIAGNOSIS — M353 Polymyalgia rheumatica: Secondary | ICD-10-CM | POA: Insufficient documentation

## 2024-02-20 DIAGNOSIS — M9902 Segmental and somatic dysfunction of thoracic region: Secondary | ICD-10-CM | POA: Diagnosis not present

## 2024-02-20 DIAGNOSIS — Z8719 Personal history of other diseases of the digestive system: Secondary | ICD-10-CM | POA: Insufficient documentation

## 2024-02-20 NOTE — Progress Notes (Signed)
   Established Patient Office Visit  Subjective   Patient ID: [name], female    DOB: Apr 11, 1966  Age: 58 y.o. MRN: [id number]  Chief Complaint  Patient presents with   Medical Management of Chronic Issues    HPI Pt is a 58 yo female who presents to the clinic with husband to discuss labs that came back positive for RA at pain clinic, Dr. Dorothyann Doing. She suggested to follow up with PCP. Pt admits her pain in joints has been worse.   She continues not to drink any alcohol. She would like her lipase recheck due to hx of pancreatitis.    ROS See HPI.    Objective:     BP 99/77 (BP Location: Right Arm, Patient Position: Sitting, Cuff Size: Normal)   Pulse 70   Wt 160 lb (72.6 kg)   SpO2 100%   BMI 26.63 kg/m  BP Readings from Last 3 Encounters:  02/17/24 99/77  08/23/23 95/65  05/20/23 138/78   Wt Readings from Last 3 Encounters:  02/17/24 160 lb (72.6 kg)  02/16/24 164 lb (74.4 kg)  08/23/23 152 lb 8 oz (69.2 kg)      Physical Exam NSR Normal breathing No upper abdominal tenderness Stable lower abdominal tenderness   The 10-year ASCVD risk score (Arnett DK, et al., 2019) is: 4.1%    Assessment & Plan:  [name] was seen today for medical management of chronic issues.  Diagnoses and all orders for this visit:  Polyarthralgia -     ANA,IFA RA Diag Pnl w/rflx Tit/Patn -     CMP14+EGFR -     CBC w/Diff/Platelet -     Lipase -     Iron -     C-reactive protein -     Sed Rate (ESR)  Positive ANA (antinuclear antibody) -     ANA,IFA RA Diag Pnl w/rflx Tit/Patn -     C-reactive protein -     Sed Rate (ESR)  History of pancreatitis -     Lipase  Osteoporosis screening -     DG Bone Density; Future  At high risk for osteoporosis -     DG Bone Density; Future  Immunization due -     Flu vaccine trivalent PF, 6mos and older(Flulaval,Afluria,Fluarix,Fluzone)  Alcohol abuse, in remission   Will get labs to re-evaluate for  RA Ordered bone density Flu shot given today IC symptoms improving once became sober Colonoscopy scheduled for October 7th.      Return if symptoms worsen or fail to improve.    Madigan Rosensteel, PA-C

## 2024-02-20 NOTE — Progress Notes (Signed)
 Graviela,   Rheumatoid factor is increased. -referral made to rheumatology.  Lipase normal Iron looks great ESR, inflammation, up some.  CRP, inflammation marker, stable.  Liver enzymes look good.  GFR is down some, make sure drinking plenty of water, your kidneys look a little dry.

## 2024-02-21 LAB — CMP14+EGFR
ALT: 17 IU/L (ref 0–32)
AST: 22 IU/L (ref 0–40)
Albumin: 4 g/dL (ref 3.8–4.9)
Alkaline Phosphatase: 106 IU/L (ref 44–121)
BUN/Creatinine Ratio: 21 (ref 9–23)
BUN: 23 mg/dL (ref 6–24)
Bilirubin Total: 0.3 mg/dL (ref 0.0–1.2)
CO2: 22 mmol/L (ref 20–29)
Calcium: 9.1 mg/dL (ref 8.7–10.2)
Chloride: 104 mmol/L (ref 96–106)
Creatinine, Ser: 1.1 mg/dL — ABNORMAL HIGH (ref 0.57–1.00)
Globulin, Total: 2.7 g/dL (ref 1.5–4.5)
Glucose: 110 mg/dL — ABNORMAL HIGH (ref 70–99)
Potassium: 4.5 mmol/L (ref 3.5–5.2)
Sodium: 142 mmol/L (ref 134–144)
Total Protein: 6.7 g/dL (ref 6.0–8.5)
eGFR: 58 mL/min/1.73 — ABNORMAL LOW (ref >59)

## 2024-02-21 LAB — CBC WITH DIFFERENTIAL/PLATELET
Basophils Absolute: 0.1 x10E3/uL (ref 0.0–0.2)
Basos: 0 %
EOS (ABSOLUTE): 0.4 x10E3/uL (ref 0.0–0.4)
Eos: 3 %
Hematocrit: 45 % (ref 34.0–46.6)
Hemoglobin: 14.7 g/dL (ref 11.1–15.9)
Immature Grans (Abs): 0 x10E3/uL (ref 0.0–0.1)
Immature Granulocytes: 0 %
Lymphocytes Absolute: 2.5 x10E3/uL (ref 0.7–3.1)
Lymphs: 21 %
MCH: 29.8 pg (ref 26.6–33.0)
MCHC: 32.7 g/dL (ref 31.5–35.7)
MCV: 91 fL (ref 79–97)
Monocytes Absolute: 0.9 x10E3/uL (ref 0.1–0.9)
Monocytes: 8 %
Neutrophils Absolute: 7.8 x10E3/uL — ABNORMAL HIGH (ref 1.4–7.0)
Neutrophils: 68 %
Platelets: 355 x10E3/uL (ref 150–450)
RBC: 4.94 x10E6/uL (ref 3.77–5.28)
RDW: 13.4 % (ref 11.7–15.4)
WBC: 11.7 x10E3/uL — ABNORMAL HIGH (ref 3.4–10.8)

## 2024-02-21 LAB — ANA,IFA RA DIAG PNL W/RFLX TIT/PATN
Cyclic Citrullin Peptide Ab: 7 U (ref 0–19)
Rheumatoid fact SerPl-aCnc: 120.6 [IU]/mL — ABNORMAL HIGH (ref <14.0)

## 2024-02-21 LAB — C-REACTIVE PROTEIN: CRP: 8 mg/L (ref 0–10)

## 2024-02-21 LAB — LIPASE: Lipase: 22 U/L (ref 14–72)

## 2024-02-21 LAB — IRON: Iron: 62 ug/dL (ref 27–159)

## 2024-02-21 LAB — SEDIMENTATION RATE: Sed Rate: 24 mm/h (ref 0–40)

## 2024-02-24 DIAGNOSIS — M5033 Other cervical disc degeneration, cervicothoracic region: Secondary | ICD-10-CM | POA: Diagnosis not present

## 2024-02-24 DIAGNOSIS — M9906 Segmental and somatic dysfunction of lower extremity: Secondary | ICD-10-CM | POA: Diagnosis not present

## 2024-02-24 DIAGNOSIS — M9902 Segmental and somatic dysfunction of thoracic region: Secondary | ICD-10-CM | POA: Diagnosis not present

## 2024-02-24 DIAGNOSIS — M9905 Segmental and somatic dysfunction of pelvic region: Secondary | ICD-10-CM | POA: Diagnosis not present

## 2024-02-24 DIAGNOSIS — M5136 Other intervertebral disc degeneration, lumbar region with discogenic back pain only: Secondary | ICD-10-CM | POA: Diagnosis not present

## 2024-02-24 DIAGNOSIS — M5134 Other intervertebral disc degeneration, thoracic region: Secondary | ICD-10-CM | POA: Diagnosis not present

## 2024-02-24 DIAGNOSIS — M9904 Segmental and somatic dysfunction of sacral region: Secondary | ICD-10-CM | POA: Diagnosis not present

## 2024-02-24 DIAGNOSIS — M9903 Segmental and somatic dysfunction of lumbar region: Secondary | ICD-10-CM | POA: Diagnosis not present

## 2024-02-24 DIAGNOSIS — M9901 Segmental and somatic dysfunction of cervical region: Secondary | ICD-10-CM | POA: Diagnosis not present

## 2024-02-24 DIAGNOSIS — M5441 Lumbago with sciatica, right side: Secondary | ICD-10-CM | POA: Diagnosis not present

## 2024-02-24 DIAGNOSIS — M5032 Other cervical disc degeneration, mid-cervical region, unspecified level: Secondary | ICD-10-CM | POA: Diagnosis not present

## 2024-02-27 DIAGNOSIS — M5033 Other cervical disc degeneration, cervicothoracic region: Secondary | ICD-10-CM | POA: Diagnosis not present

## 2024-02-27 DIAGNOSIS — M9902 Segmental and somatic dysfunction of thoracic region: Secondary | ICD-10-CM | POA: Diagnosis not present

## 2024-02-27 DIAGNOSIS — M9903 Segmental and somatic dysfunction of lumbar region: Secondary | ICD-10-CM | POA: Diagnosis not present

## 2024-02-27 DIAGNOSIS — M9904 Segmental and somatic dysfunction of sacral region: Secondary | ICD-10-CM | POA: Diagnosis not present

## 2024-02-27 DIAGNOSIS — M9901 Segmental and somatic dysfunction of cervical region: Secondary | ICD-10-CM | POA: Diagnosis not present

## 2024-02-27 DIAGNOSIS — M5441 Lumbago with sciatica, right side: Secondary | ICD-10-CM | POA: Diagnosis not present

## 2024-02-27 DIAGNOSIS — M5032 Other cervical disc degeneration, mid-cervical region, unspecified level: Secondary | ICD-10-CM | POA: Diagnosis not present

## 2024-02-27 DIAGNOSIS — M5134 Other intervertebral disc degeneration, thoracic region: Secondary | ICD-10-CM | POA: Diagnosis not present

## 2024-02-27 DIAGNOSIS — M5136 Other intervertebral disc degeneration, lumbar region with discogenic back pain only: Secondary | ICD-10-CM | POA: Diagnosis not present

## 2024-02-27 DIAGNOSIS — M9906 Segmental and somatic dysfunction of lower extremity: Secondary | ICD-10-CM | POA: Diagnosis not present

## 2024-02-27 DIAGNOSIS — M9905 Segmental and somatic dysfunction of pelvic region: Secondary | ICD-10-CM | POA: Diagnosis not present

## 2024-03-01 DIAGNOSIS — Z6828 Body mass index (BMI) 28.0-28.9, adult: Secondary | ICD-10-CM | POA: Diagnosis not present

## 2024-03-01 DIAGNOSIS — F1120 Opioid dependence, uncomplicated: Secondary | ICD-10-CM | POA: Diagnosis not present

## 2024-03-01 DIAGNOSIS — R102 Pelvic and perineal pain: Secondary | ICD-10-CM | POA: Diagnosis not present

## 2024-03-01 DIAGNOSIS — F17210 Nicotine dependence, cigarettes, uncomplicated: Secondary | ICD-10-CM | POA: Diagnosis not present

## 2024-03-01 DIAGNOSIS — Z79891 Long term (current) use of opiate analgesic: Secondary | ICD-10-CM | POA: Diagnosis not present

## 2024-03-01 DIAGNOSIS — G8929 Other chronic pain: Secondary | ICD-10-CM | POA: Diagnosis not present

## 2024-03-01 DIAGNOSIS — Z79899 Other long term (current) drug therapy: Secondary | ICD-10-CM | POA: Diagnosis not present

## 2024-03-05 DIAGNOSIS — M5032 Other cervical disc degeneration, mid-cervical region, unspecified level: Secondary | ICD-10-CM | POA: Diagnosis not present

## 2024-03-05 DIAGNOSIS — M5136 Other intervertebral disc degeneration, lumbar region with discogenic back pain only: Secondary | ICD-10-CM | POA: Diagnosis not present

## 2024-03-05 DIAGNOSIS — M9903 Segmental and somatic dysfunction of lumbar region: Secondary | ICD-10-CM | POA: Diagnosis not present

## 2024-03-05 DIAGNOSIS — M9904 Segmental and somatic dysfunction of sacral region: Secondary | ICD-10-CM | POA: Diagnosis not present

## 2024-03-05 DIAGNOSIS — M9905 Segmental and somatic dysfunction of pelvic region: Secondary | ICD-10-CM | POA: Diagnosis not present

## 2024-03-05 DIAGNOSIS — M5441 Lumbago with sciatica, right side: Secondary | ICD-10-CM | POA: Diagnosis not present

## 2024-03-05 DIAGNOSIS — M9906 Segmental and somatic dysfunction of lower extremity: Secondary | ICD-10-CM | POA: Diagnosis not present

## 2024-03-05 DIAGNOSIS — M9902 Segmental and somatic dysfunction of thoracic region: Secondary | ICD-10-CM | POA: Diagnosis not present

## 2024-03-05 DIAGNOSIS — M5134 Other intervertebral disc degeneration, thoracic region: Secondary | ICD-10-CM | POA: Diagnosis not present

## 2024-03-05 DIAGNOSIS — M5033 Other cervical disc degeneration, cervicothoracic region: Secondary | ICD-10-CM | POA: Diagnosis not present

## 2024-03-08 ENCOUNTER — Ambulatory Visit (INDEPENDENT_AMBULATORY_CARE_PROVIDER_SITE_OTHER)

## 2024-03-08 DIAGNOSIS — Z1231 Encounter for screening mammogram for malignant neoplasm of breast: Secondary | ICD-10-CM

## 2024-03-09 DIAGNOSIS — M5134 Other intervertebral disc degeneration, thoracic region: Secondary | ICD-10-CM | POA: Diagnosis not present

## 2024-03-09 DIAGNOSIS — M9904 Segmental and somatic dysfunction of sacral region: Secondary | ICD-10-CM | POA: Diagnosis not present

## 2024-03-09 DIAGNOSIS — M5441 Lumbago with sciatica, right side: Secondary | ICD-10-CM | POA: Diagnosis not present

## 2024-03-09 DIAGNOSIS — M9905 Segmental and somatic dysfunction of pelvic region: Secondary | ICD-10-CM | POA: Diagnosis not present

## 2024-03-09 DIAGNOSIS — M5136 Other intervertebral disc degeneration, lumbar region with discogenic back pain only: Secondary | ICD-10-CM | POA: Diagnosis not present

## 2024-03-09 DIAGNOSIS — M9902 Segmental and somatic dysfunction of thoracic region: Secondary | ICD-10-CM | POA: Diagnosis not present

## 2024-03-09 DIAGNOSIS — M9906 Segmental and somatic dysfunction of lower extremity: Secondary | ICD-10-CM | POA: Diagnosis not present

## 2024-03-09 DIAGNOSIS — M9901 Segmental and somatic dysfunction of cervical region: Secondary | ICD-10-CM | POA: Diagnosis not present

## 2024-03-09 DIAGNOSIS — M9903 Segmental and somatic dysfunction of lumbar region: Secondary | ICD-10-CM | POA: Diagnosis not present

## 2024-03-09 DIAGNOSIS — M5032 Other cervical disc degeneration, mid-cervical region, unspecified level: Secondary | ICD-10-CM | POA: Diagnosis not present

## 2024-03-09 DIAGNOSIS — M5033 Other cervical disc degeneration, cervicothoracic region: Secondary | ICD-10-CM | POA: Diagnosis not present

## 2024-03-12 ENCOUNTER — Ambulatory Visit: Payer: Self-pay | Admitting: Physician Assistant

## 2024-03-12 DIAGNOSIS — M5033 Other cervical disc degeneration, cervicothoracic region: Secondary | ICD-10-CM | POA: Diagnosis not present

## 2024-03-12 DIAGNOSIS — M5441 Lumbago with sciatica, right side: Secondary | ICD-10-CM | POA: Diagnosis not present

## 2024-03-12 DIAGNOSIS — M5134 Other intervertebral disc degeneration, thoracic region: Secondary | ICD-10-CM | POA: Diagnosis not present

## 2024-03-12 DIAGNOSIS — M9902 Segmental and somatic dysfunction of thoracic region: Secondary | ICD-10-CM | POA: Diagnosis not present

## 2024-03-12 DIAGNOSIS — M9905 Segmental and somatic dysfunction of pelvic region: Secondary | ICD-10-CM | POA: Diagnosis not present

## 2024-03-12 DIAGNOSIS — M9906 Segmental and somatic dysfunction of lower extremity: Secondary | ICD-10-CM | POA: Diagnosis not present

## 2024-03-12 DIAGNOSIS — M9904 Segmental and somatic dysfunction of sacral region: Secondary | ICD-10-CM | POA: Diagnosis not present

## 2024-03-12 DIAGNOSIS — M9903 Segmental and somatic dysfunction of lumbar region: Secondary | ICD-10-CM | POA: Diagnosis not present

## 2024-03-12 DIAGNOSIS — M5136 Other intervertebral disc degeneration, lumbar region with discogenic back pain only: Secondary | ICD-10-CM | POA: Diagnosis not present

## 2024-03-12 DIAGNOSIS — M5032 Other cervical disc degeneration, mid-cervical region, unspecified level: Secondary | ICD-10-CM | POA: Diagnosis not present

## 2024-03-12 DIAGNOSIS — M9901 Segmental and somatic dysfunction of cervical region: Secondary | ICD-10-CM | POA: Diagnosis not present

## 2024-03-12 NOTE — Progress Notes (Signed)
 Jara,   Normal mammogram. Follow up in 1 year.

## 2024-03-13 NOTE — Telephone Encounter (Signed)
 Attempted call to patient to assist her in scheduling an appt for urine freuqency , urgency and burning - requested a return call to aschld.

## 2024-03-13 NOTE — Telephone Encounter (Signed)
 LVM advising pt of result note in MyChart. Callback information provided.

## 2024-03-14 ENCOUNTER — Other Ambulatory Visit: Payer: Self-pay | Admitting: Physician Assistant

## 2024-03-14 DIAGNOSIS — G2581 Restless legs syndrome: Secondary | ICD-10-CM

## 2024-03-14 NOTE — Telephone Encounter (Signed)
 Attempted call again to assist patient in scheduling an appt . Left a voice mail message requesting a return call.

## 2024-03-15 DIAGNOSIS — M5441 Lumbago with sciatica, right side: Secondary | ICD-10-CM | POA: Diagnosis not present

## 2024-03-15 DIAGNOSIS — M9905 Segmental and somatic dysfunction of pelvic region: Secondary | ICD-10-CM | POA: Diagnosis not present

## 2024-03-15 DIAGNOSIS — M9904 Segmental and somatic dysfunction of sacral region: Secondary | ICD-10-CM | POA: Diagnosis not present

## 2024-03-15 DIAGNOSIS — M9906 Segmental and somatic dysfunction of lower extremity: Secondary | ICD-10-CM | POA: Diagnosis not present

## 2024-03-15 DIAGNOSIS — M9901 Segmental and somatic dysfunction of cervical region: Secondary | ICD-10-CM | POA: Diagnosis not present

## 2024-03-15 DIAGNOSIS — M9903 Segmental and somatic dysfunction of lumbar region: Secondary | ICD-10-CM | POA: Diagnosis not present

## 2024-03-15 DIAGNOSIS — M9902 Segmental and somatic dysfunction of thoracic region: Secondary | ICD-10-CM | POA: Diagnosis not present

## 2024-03-15 DIAGNOSIS — M5033 Other cervical disc degeneration, cervicothoracic region: Secondary | ICD-10-CM | POA: Diagnosis not present

## 2024-03-15 DIAGNOSIS — M5136 Other intervertebral disc degeneration, lumbar region with discogenic back pain only: Secondary | ICD-10-CM | POA: Diagnosis not present

## 2024-03-15 DIAGNOSIS — M5032 Other cervical disc degeneration, mid-cervical region, unspecified level: Secondary | ICD-10-CM | POA: Diagnosis not present

## 2024-03-15 DIAGNOSIS — M5134 Other intervertebral disc degeneration, thoracic region: Secondary | ICD-10-CM | POA: Diagnosis not present

## 2024-03-20 DIAGNOSIS — G8929 Other chronic pain: Secondary | ICD-10-CM | POA: Diagnosis not present

## 2024-03-20 DIAGNOSIS — I10 Essential (primary) hypertension: Secondary | ICD-10-CM | POA: Diagnosis not present

## 2024-03-20 DIAGNOSIS — K635 Polyp of colon: Secondary | ICD-10-CM | POA: Diagnosis not present

## 2024-03-20 DIAGNOSIS — D124 Benign neoplasm of descending colon: Secondary | ICD-10-CM | POA: Diagnosis not present

## 2024-03-20 DIAGNOSIS — F17210 Nicotine dependence, cigarettes, uncomplicated: Secondary | ICD-10-CM | POA: Diagnosis not present

## 2024-03-20 DIAGNOSIS — Z1211 Encounter for screening for malignant neoplasm of colon: Secondary | ICD-10-CM | POA: Diagnosis not present

## 2024-03-20 DIAGNOSIS — F1011 Alcohol abuse, in remission: Secondary | ICD-10-CM | POA: Diagnosis not present

## 2024-03-20 DIAGNOSIS — D122 Benign neoplasm of ascending colon: Secondary | ICD-10-CM | POA: Diagnosis not present

## 2024-03-20 DIAGNOSIS — K648 Other hemorrhoids: Secondary | ICD-10-CM | POA: Diagnosis not present

## 2024-03-20 DIAGNOSIS — F32A Depression, unspecified: Secondary | ICD-10-CM | POA: Diagnosis not present

## 2024-03-20 DIAGNOSIS — Z888 Allergy status to other drugs, medicaments and biological substances status: Secondary | ICD-10-CM | POA: Diagnosis not present

## 2024-03-20 DIAGNOSIS — K861 Other chronic pancreatitis: Secondary | ICD-10-CM | POA: Diagnosis not present

## 2024-03-20 DIAGNOSIS — K589 Irritable bowel syndrome without diarrhea: Secondary | ICD-10-CM | POA: Diagnosis not present

## 2024-03-20 DIAGNOSIS — Z885 Allergy status to narcotic agent status: Secondary | ICD-10-CM | POA: Diagnosis not present

## 2024-03-20 DIAGNOSIS — K862 Cyst of pancreas: Secondary | ICD-10-CM | POA: Diagnosis not present

## 2024-03-20 DIAGNOSIS — K219 Gastro-esophageal reflux disease without esophagitis: Secondary | ICD-10-CM | POA: Diagnosis not present

## 2024-03-20 DIAGNOSIS — Z8541 Personal history of malignant neoplasm of cervix uteri: Secondary | ICD-10-CM | POA: Diagnosis not present

## 2024-03-26 ENCOUNTER — Encounter: Payer: Self-pay | Admitting: Physician Assistant

## 2024-03-26 DIAGNOSIS — M5136 Other intervertebral disc degeneration, lumbar region with discogenic back pain only: Secondary | ICD-10-CM | POA: Diagnosis not present

## 2024-03-26 DIAGNOSIS — M9903 Segmental and somatic dysfunction of lumbar region: Secondary | ICD-10-CM | POA: Diagnosis not present

## 2024-03-26 DIAGNOSIS — M5441 Lumbago with sciatica, right side: Secondary | ICD-10-CM | POA: Diagnosis not present

## 2024-03-26 DIAGNOSIS — M9906 Segmental and somatic dysfunction of lower extremity: Secondary | ICD-10-CM | POA: Diagnosis not present

## 2024-03-26 DIAGNOSIS — M5033 Other cervical disc degeneration, cervicothoracic region: Secondary | ICD-10-CM | POA: Diagnosis not present

## 2024-03-26 DIAGNOSIS — M9901 Segmental and somatic dysfunction of cervical region: Secondary | ICD-10-CM | POA: Diagnosis not present

## 2024-03-26 DIAGNOSIS — M9905 Segmental and somatic dysfunction of pelvic region: Secondary | ICD-10-CM | POA: Diagnosis not present

## 2024-03-26 DIAGNOSIS — M9904 Segmental and somatic dysfunction of sacral region: Secondary | ICD-10-CM | POA: Diagnosis not present

## 2024-03-26 DIAGNOSIS — M5032 Other cervical disc degeneration, mid-cervical region, unspecified level: Secondary | ICD-10-CM | POA: Diagnosis not present

## 2024-03-26 DIAGNOSIS — M9902 Segmental and somatic dysfunction of thoracic region: Secondary | ICD-10-CM | POA: Diagnosis not present

## 2024-03-26 DIAGNOSIS — M5134 Other intervertebral disc degeneration, thoracic region: Secondary | ICD-10-CM | POA: Diagnosis not present

## 2024-03-26 NOTE — Telephone Encounter (Signed)
 FYI for up front.

## 2024-03-27 NOTE — Telephone Encounter (Signed)
 The results are showing under lab in the patient's chart.

## 2024-03-28 ENCOUNTER — Encounter: Payer: Self-pay | Admitting: Physician Assistant

## 2024-03-28 DIAGNOSIS — D126 Benign neoplasm of colon, unspecified: Secondary | ICD-10-CM | POA: Insufficient documentation

## 2024-03-29 DIAGNOSIS — F1120 Opioid dependence, uncomplicated: Secondary | ICD-10-CM | POA: Diagnosis not present

## 2024-03-29 DIAGNOSIS — Z79899 Other long term (current) drug therapy: Secondary | ICD-10-CM | POA: Diagnosis not present

## 2024-03-29 DIAGNOSIS — R3915 Urgency of urination: Secondary | ICD-10-CM | POA: Diagnosis not present

## 2024-03-29 DIAGNOSIS — Z6829 Body mass index (BMI) 29.0-29.9, adult: Secondary | ICD-10-CM | POA: Diagnosis not present

## 2024-03-29 DIAGNOSIS — G8929 Other chronic pain: Secondary | ICD-10-CM | POA: Diagnosis not present

## 2024-03-29 DIAGNOSIS — R102 Pelvic and perineal pain unspecified side: Secondary | ICD-10-CM | POA: Diagnosis not present

## 2024-03-30 NOTE — Telephone Encounter (Signed)
 03/30/2024-Left message on patients voicemail to contact the office to schedule patients daughter an appointment with Vermell Bologna, PA.

## 2024-04-02 DIAGNOSIS — M9902 Segmental and somatic dysfunction of thoracic region: Secondary | ICD-10-CM | POA: Diagnosis not present

## 2024-04-02 DIAGNOSIS — M9906 Segmental and somatic dysfunction of lower extremity: Secondary | ICD-10-CM | POA: Diagnosis not present

## 2024-04-02 DIAGNOSIS — M5032 Other cervical disc degeneration, mid-cervical region, unspecified level: Secondary | ICD-10-CM | POA: Diagnosis not present

## 2024-04-02 DIAGNOSIS — M5134 Other intervertebral disc degeneration, thoracic region: Secondary | ICD-10-CM | POA: Diagnosis not present

## 2024-04-02 DIAGNOSIS — M9905 Segmental and somatic dysfunction of pelvic region: Secondary | ICD-10-CM | POA: Diagnosis not present

## 2024-04-02 DIAGNOSIS — M5033 Other cervical disc degeneration, cervicothoracic region: Secondary | ICD-10-CM | POA: Diagnosis not present

## 2024-04-02 DIAGNOSIS — M9901 Segmental and somatic dysfunction of cervical region: Secondary | ICD-10-CM | POA: Diagnosis not present

## 2024-04-02 DIAGNOSIS — M9903 Segmental and somatic dysfunction of lumbar region: Secondary | ICD-10-CM | POA: Diagnosis not present

## 2024-04-02 DIAGNOSIS — M5136 Other intervertebral disc degeneration, lumbar region with discogenic back pain only: Secondary | ICD-10-CM | POA: Diagnosis not present

## 2024-04-02 DIAGNOSIS — M5441 Lumbago with sciatica, right side: Secondary | ICD-10-CM | POA: Diagnosis not present

## 2024-04-02 DIAGNOSIS — M9904 Segmental and somatic dysfunction of sacral region: Secondary | ICD-10-CM | POA: Diagnosis not present

## 2024-04-06 ENCOUNTER — Other Ambulatory Visit: Payer: Self-pay | Admitting: Physician Assistant

## 2024-04-06 DIAGNOSIS — R110 Nausea: Secondary | ICD-10-CM

## 2024-04-09 DIAGNOSIS — M5136 Other intervertebral disc degeneration, lumbar region with discogenic back pain only: Secondary | ICD-10-CM | POA: Diagnosis not present

## 2024-04-09 DIAGNOSIS — M9902 Segmental and somatic dysfunction of thoracic region: Secondary | ICD-10-CM | POA: Diagnosis not present

## 2024-04-09 DIAGNOSIS — M9901 Segmental and somatic dysfunction of cervical region: Secondary | ICD-10-CM | POA: Diagnosis not present

## 2024-04-09 DIAGNOSIS — M5032 Other cervical disc degeneration, mid-cervical region, unspecified level: Secondary | ICD-10-CM | POA: Diagnosis not present

## 2024-04-09 DIAGNOSIS — M9904 Segmental and somatic dysfunction of sacral region: Secondary | ICD-10-CM | POA: Diagnosis not present

## 2024-04-09 DIAGNOSIS — M9906 Segmental and somatic dysfunction of lower extremity: Secondary | ICD-10-CM | POA: Diagnosis not present

## 2024-04-09 DIAGNOSIS — M5134 Other intervertebral disc degeneration, thoracic region: Secondary | ICD-10-CM | POA: Diagnosis not present

## 2024-04-09 DIAGNOSIS — M5033 Other cervical disc degeneration, cervicothoracic region: Secondary | ICD-10-CM | POA: Diagnosis not present

## 2024-04-09 DIAGNOSIS — M9905 Segmental and somatic dysfunction of pelvic region: Secondary | ICD-10-CM | POA: Diagnosis not present

## 2024-04-09 DIAGNOSIS — M5441 Lumbago with sciatica, right side: Secondary | ICD-10-CM | POA: Diagnosis not present

## 2024-04-09 DIAGNOSIS — M9903 Segmental and somatic dysfunction of lumbar region: Secondary | ICD-10-CM | POA: Diagnosis not present

## 2024-04-23 DIAGNOSIS — M5033 Other cervical disc degeneration, cervicothoracic region: Secondary | ICD-10-CM | POA: Diagnosis not present

## 2024-04-23 DIAGNOSIS — M9905 Segmental and somatic dysfunction of pelvic region: Secondary | ICD-10-CM | POA: Diagnosis not present

## 2024-04-23 DIAGNOSIS — M5136 Other intervertebral disc degeneration, lumbar region with discogenic back pain only: Secondary | ICD-10-CM | POA: Diagnosis not present

## 2024-04-23 DIAGNOSIS — M9906 Segmental and somatic dysfunction of lower extremity: Secondary | ICD-10-CM | POA: Diagnosis not present

## 2024-04-23 DIAGNOSIS — M5441 Lumbago with sciatica, right side: Secondary | ICD-10-CM | POA: Diagnosis not present

## 2024-04-23 DIAGNOSIS — M9901 Segmental and somatic dysfunction of cervical region: Secondary | ICD-10-CM | POA: Diagnosis not present

## 2024-04-23 DIAGNOSIS — M9904 Segmental and somatic dysfunction of sacral region: Secondary | ICD-10-CM | POA: Diagnosis not present

## 2024-04-23 DIAGNOSIS — M5134 Other intervertebral disc degeneration, thoracic region: Secondary | ICD-10-CM | POA: Diagnosis not present

## 2024-04-23 DIAGNOSIS — M5032 Other cervical disc degeneration, mid-cervical region, unspecified level: Secondary | ICD-10-CM | POA: Diagnosis not present

## 2024-04-23 DIAGNOSIS — M9902 Segmental and somatic dysfunction of thoracic region: Secondary | ICD-10-CM | POA: Diagnosis not present

## 2024-04-23 DIAGNOSIS — M9903 Segmental and somatic dysfunction of lumbar region: Secondary | ICD-10-CM | POA: Diagnosis not present

## 2024-04-25 ENCOUNTER — Ambulatory Visit (INDEPENDENT_AMBULATORY_CARE_PROVIDER_SITE_OTHER)

## 2024-04-25 DIAGNOSIS — Z13820 Encounter for screening for osteoporosis: Secondary | ICD-10-CM

## 2024-04-25 DIAGNOSIS — M8580 Other specified disorders of bone density and structure, unspecified site: Secondary | ICD-10-CM | POA: Insufficient documentation

## 2024-04-25 DIAGNOSIS — Z9189 Other specified personal risk factors, not elsewhere classified: Secondary | ICD-10-CM

## 2024-04-25 NOTE — Progress Notes (Signed)
 Osteopenia, low bone mass, goal is prevention with vitamin D /Calcium and low weight strength training or body resistance training. Recheck in 2 year.

## 2024-04-27 DIAGNOSIS — I10 Essential (primary) hypertension: Secondary | ICD-10-CM | POA: Diagnosis not present

## 2024-04-27 DIAGNOSIS — F1120 Opioid dependence, uncomplicated: Secondary | ICD-10-CM | POA: Diagnosis not present

## 2024-04-27 DIAGNOSIS — R102 Pelvic and perineal pain unspecified side: Secondary | ICD-10-CM | POA: Diagnosis not present

## 2024-04-27 DIAGNOSIS — Z6829 Body mass index (BMI) 29.0-29.9, adult: Secondary | ICD-10-CM | POA: Diagnosis not present

## 2024-04-27 DIAGNOSIS — Z79891 Long term (current) use of opiate analgesic: Secondary | ICD-10-CM | POA: Diagnosis not present

## 2024-04-27 DIAGNOSIS — G8929 Other chronic pain: Secondary | ICD-10-CM | POA: Diagnosis not present

## 2024-04-27 DIAGNOSIS — Z79899 Other long term (current) drug therapy: Secondary | ICD-10-CM | POA: Diagnosis not present

## 2024-04-30 DIAGNOSIS — M5033 Other cervical disc degeneration, cervicothoracic region: Secondary | ICD-10-CM | POA: Diagnosis not present

## 2024-04-30 DIAGNOSIS — M5441 Lumbago with sciatica, right side: Secondary | ICD-10-CM | POA: Diagnosis not present

## 2024-04-30 DIAGNOSIS — M9905 Segmental and somatic dysfunction of pelvic region: Secondary | ICD-10-CM | POA: Diagnosis not present

## 2024-04-30 DIAGNOSIS — M9901 Segmental and somatic dysfunction of cervical region: Secondary | ICD-10-CM | POA: Diagnosis not present

## 2024-04-30 DIAGNOSIS — M9903 Segmental and somatic dysfunction of lumbar region: Secondary | ICD-10-CM | POA: Diagnosis not present

## 2024-04-30 DIAGNOSIS — M5134 Other intervertebral disc degeneration, thoracic region: Secondary | ICD-10-CM | POA: Diagnosis not present

## 2024-04-30 DIAGNOSIS — M5032 Other cervical disc degeneration, mid-cervical region, unspecified level: Secondary | ICD-10-CM | POA: Diagnosis not present

## 2024-04-30 DIAGNOSIS — M5136 Other intervertebral disc degeneration, lumbar region with discogenic back pain only: Secondary | ICD-10-CM | POA: Diagnosis not present

## 2024-04-30 DIAGNOSIS — M9904 Segmental and somatic dysfunction of sacral region: Secondary | ICD-10-CM | POA: Diagnosis not present

## 2024-04-30 DIAGNOSIS — M9906 Segmental and somatic dysfunction of lower extremity: Secondary | ICD-10-CM | POA: Diagnosis not present

## 2024-04-30 DIAGNOSIS — M9902 Segmental and somatic dysfunction of thoracic region: Secondary | ICD-10-CM | POA: Diagnosis not present

## 2024-05-02 ENCOUNTER — Other Ambulatory Visit: Payer: Self-pay | Admitting: Physician Assistant

## 2024-05-02 DIAGNOSIS — F4321 Adjustment disorder with depressed mood: Secondary | ICD-10-CM

## 2024-05-07 DIAGNOSIS — M5033 Other cervical disc degeneration, cervicothoracic region: Secondary | ICD-10-CM | POA: Diagnosis not present

## 2024-05-07 DIAGNOSIS — M9905 Segmental and somatic dysfunction of pelvic region: Secondary | ICD-10-CM | POA: Diagnosis not present

## 2024-05-07 DIAGNOSIS — M5441 Lumbago with sciatica, right side: Secondary | ICD-10-CM | POA: Diagnosis not present

## 2024-05-07 DIAGNOSIS — M9902 Segmental and somatic dysfunction of thoracic region: Secondary | ICD-10-CM | POA: Diagnosis not present

## 2024-05-07 DIAGNOSIS — M9904 Segmental and somatic dysfunction of sacral region: Secondary | ICD-10-CM | POA: Diagnosis not present

## 2024-05-07 DIAGNOSIS — M9901 Segmental and somatic dysfunction of cervical region: Secondary | ICD-10-CM | POA: Diagnosis not present

## 2024-05-07 DIAGNOSIS — M9906 Segmental and somatic dysfunction of lower extremity: Secondary | ICD-10-CM | POA: Diagnosis not present

## 2024-05-07 DIAGNOSIS — M5136 Other intervertebral disc degeneration, lumbar region with discogenic back pain only: Secondary | ICD-10-CM | POA: Diagnosis not present

## 2024-05-07 DIAGNOSIS — M9903 Segmental and somatic dysfunction of lumbar region: Secondary | ICD-10-CM | POA: Diagnosis not present

## 2024-05-07 DIAGNOSIS — M5134 Other intervertebral disc degeneration, thoracic region: Secondary | ICD-10-CM | POA: Diagnosis not present

## 2024-05-07 DIAGNOSIS — M5032 Other cervical disc degeneration, mid-cervical region, unspecified level: Secondary | ICD-10-CM | POA: Diagnosis not present

## 2024-05-14 DIAGNOSIS — M9905 Segmental and somatic dysfunction of pelvic region: Secondary | ICD-10-CM | POA: Diagnosis not present

## 2024-05-14 DIAGNOSIS — M9906 Segmental and somatic dysfunction of lower extremity: Secondary | ICD-10-CM | POA: Diagnosis not present

## 2024-05-14 DIAGNOSIS — M5032 Other cervical disc degeneration, mid-cervical region, unspecified level: Secondary | ICD-10-CM | POA: Diagnosis not present

## 2024-05-14 DIAGNOSIS — M9904 Segmental and somatic dysfunction of sacral region: Secondary | ICD-10-CM | POA: Diagnosis not present

## 2024-05-14 DIAGNOSIS — M5033 Other cervical disc degeneration, cervicothoracic region: Secondary | ICD-10-CM | POA: Diagnosis not present

## 2024-05-14 DIAGNOSIS — M5134 Other intervertebral disc degeneration, thoracic region: Secondary | ICD-10-CM | POA: Diagnosis not present

## 2024-05-14 DIAGNOSIS — M5441 Lumbago with sciatica, right side: Secondary | ICD-10-CM | POA: Diagnosis not present

## 2024-05-14 DIAGNOSIS — M9903 Segmental and somatic dysfunction of lumbar region: Secondary | ICD-10-CM | POA: Diagnosis not present

## 2024-05-14 DIAGNOSIS — M9901 Segmental and somatic dysfunction of cervical region: Secondary | ICD-10-CM | POA: Diagnosis not present

## 2024-05-14 DIAGNOSIS — M5136 Other intervertebral disc degeneration, lumbar region with discogenic back pain only: Secondary | ICD-10-CM | POA: Diagnosis not present

## 2024-05-14 DIAGNOSIS — M9902 Segmental and somatic dysfunction of thoracic region: Secondary | ICD-10-CM | POA: Diagnosis not present

## 2024-05-21 ENCOUNTER — Encounter: Payer: Self-pay | Admitting: Physician Assistant

## 2024-05-21 DIAGNOSIS — M9904 Segmental and somatic dysfunction of sacral region: Secondary | ICD-10-CM | POA: Diagnosis not present

## 2024-05-21 DIAGNOSIS — M5134 Other intervertebral disc degeneration, thoracic region: Secondary | ICD-10-CM | POA: Diagnosis not present

## 2024-05-21 DIAGNOSIS — M5136 Other intervertebral disc degeneration, lumbar region with discogenic back pain only: Secondary | ICD-10-CM | POA: Diagnosis not present

## 2024-05-21 DIAGNOSIS — M5032 Other cervical disc degeneration, mid-cervical region, unspecified level: Secondary | ICD-10-CM | POA: Diagnosis not present

## 2024-05-21 DIAGNOSIS — M9901 Segmental and somatic dysfunction of cervical region: Secondary | ICD-10-CM | POA: Diagnosis not present

## 2024-05-21 DIAGNOSIS — M5441 Lumbago with sciatica, right side: Secondary | ICD-10-CM | POA: Diagnosis not present

## 2024-05-21 DIAGNOSIS — M5033 Other cervical disc degeneration, cervicothoracic region: Secondary | ICD-10-CM | POA: Diagnosis not present

## 2024-05-21 DIAGNOSIS — M9903 Segmental and somatic dysfunction of lumbar region: Secondary | ICD-10-CM | POA: Diagnosis not present

## 2024-05-21 DIAGNOSIS — M9902 Segmental and somatic dysfunction of thoracic region: Secondary | ICD-10-CM | POA: Diagnosis not present

## 2024-05-21 DIAGNOSIS — M9906 Segmental and somatic dysfunction of lower extremity: Secondary | ICD-10-CM | POA: Diagnosis not present

## 2024-05-21 DIAGNOSIS — M9905 Segmental and somatic dysfunction of pelvic region: Secondary | ICD-10-CM | POA: Diagnosis not present

## 2024-05-21 NOTE — Telephone Encounter (Signed)
 Get her worked in with me tomorrow.

## 2024-05-22 ENCOUNTER — Ambulatory Visit: Admitting: Physician Assistant

## 2024-05-26 ENCOUNTER — Encounter: Payer: Self-pay | Admitting: Physician Assistant

## 2024-05-30 DIAGNOSIS — I10 Essential (primary) hypertension: Secondary | ICD-10-CM | POA: Diagnosis not present

## 2024-05-30 DIAGNOSIS — F1120 Opioid dependence, uncomplicated: Secondary | ICD-10-CM | POA: Diagnosis not present

## 2024-05-30 DIAGNOSIS — Z6830 Body mass index (BMI) 30.0-30.9, adult: Secondary | ICD-10-CM | POA: Diagnosis not present

## 2024-05-30 DIAGNOSIS — G8929 Other chronic pain: Secondary | ICD-10-CM | POA: Diagnosis not present

## 2024-05-30 DIAGNOSIS — R102 Pelvic and perineal pain unspecified side: Secondary | ICD-10-CM | POA: Diagnosis not present

## 2024-06-01 DIAGNOSIS — M5134 Other intervertebral disc degeneration, thoracic region: Secondary | ICD-10-CM | POA: Diagnosis not present

## 2024-06-01 DIAGNOSIS — M9904 Segmental and somatic dysfunction of sacral region: Secondary | ICD-10-CM | POA: Diagnosis not present

## 2024-06-01 DIAGNOSIS — M5136 Other intervertebral disc degeneration, lumbar region with discogenic back pain only: Secondary | ICD-10-CM | POA: Diagnosis not present

## 2024-06-01 DIAGNOSIS — M9902 Segmental and somatic dysfunction of thoracic region: Secondary | ICD-10-CM | POA: Diagnosis not present

## 2024-06-01 DIAGNOSIS — M9905 Segmental and somatic dysfunction of pelvic region: Secondary | ICD-10-CM | POA: Diagnosis not present

## 2024-06-01 DIAGNOSIS — M9906 Segmental and somatic dysfunction of lower extremity: Secondary | ICD-10-CM | POA: Diagnosis not present

## 2024-06-01 DIAGNOSIS — M9903 Segmental and somatic dysfunction of lumbar region: Secondary | ICD-10-CM | POA: Diagnosis not present

## 2024-06-01 DIAGNOSIS — M5032 Other cervical disc degeneration, mid-cervical region, unspecified level: Secondary | ICD-10-CM | POA: Diagnosis not present

## 2024-06-01 DIAGNOSIS — M5033 Other cervical disc degeneration, cervicothoracic region: Secondary | ICD-10-CM | POA: Diagnosis not present

## 2024-06-01 DIAGNOSIS — M5441 Lumbago with sciatica, right side: Secondary | ICD-10-CM | POA: Diagnosis not present

## 2024-06-01 DIAGNOSIS — M9901 Segmental and somatic dysfunction of cervical region: Secondary | ICD-10-CM | POA: Diagnosis not present

## 2024-06-02 ENCOUNTER — Other Ambulatory Visit: Payer: Self-pay | Admitting: Physician Assistant

## 2024-06-02 DIAGNOSIS — G894 Chronic pain syndrome: Secondary | ICD-10-CM

## 2024-06-02 DIAGNOSIS — N3011 Interstitial cystitis (chronic) with hematuria: Secondary | ICD-10-CM

## 2024-06-03 ENCOUNTER — Other Ambulatory Visit: Payer: Self-pay | Admitting: Physician Assistant

## 2024-06-03 DIAGNOSIS — R110 Nausea: Secondary | ICD-10-CM

## 2024-07-20 NOTE — Progress Notes (Unsigned)
 "  Office Visit Note  Patient: [name]             Date of Birth: 01-Jul-1965           MRN: [id number]             PCP: Antoniette Vermell LITTIE DEVONNA Referring: Antoniette Vermell LITTIE DEVONNA Visit Date: 08/03/2024 Occupation: Data Unavailable  Subjective:  No chief complaint on file.   History of Present Illness: [name] is a 59 y.o. female ***     Activities of Daily Living:  Patient reports morning stiffness for *** {minute/hour:19697}.   Patient {ACTIONS;DENIES/REPORTS:21021675::Denies} nocturnal pain.  Difficulty dressing/grooming: {ACTIONS;DENIES/REPORTS:21021675::Denies} Difficulty climbing stairs: {ACTIONS;DENIES/REPORTS:21021675::Denies} Difficulty getting out of chair: {ACTIONS;DENIES/REPORTS:21021675::Denies} Difficulty using hands for taps, buttons, cutlery, and/or writing: {ACTIONS;DENIES/REPORTS:21021675::Denies}  No Rheumatology ROS completed.   PMFS History:  Patient Active Problem List   Diagnosis Date Noted   Osteopenia 04/25/2024   Adenomatous colon polyp 03/28/2024   At high risk for osteoporosis 02/20/2024   Osteoporosis screening 02/20/2024   History of pancreatitis 02/20/2024   Family history of rheumatoid arthritis 02/20/2024   Polymyalgia 02/20/2024   Polyarthralgia 02/17/2024   Elevated rheumatoid factor 02/17/2024   MVP (mitral valve prolapse) 10/04/2023   Leukocytosis 08/24/2023   Hypokalemia 08/24/2023   Elevated brain natriuretic peptide (BNP) level 08/24/2023   Hyperkalemia 05/20/2023   Alcohol-induced acute pancreatitis 05/20/2023   Dry skin 05/20/2023   History of kidney stones 04/29/2023   Nausea 02/18/2023   PTSD (post-traumatic stress disorder) 09/27/2022   Grief at loss of child 06/25/2022   Adjustment disorder with mixed anxiety and depressed mood 06/25/2022   Alcohol abuse, in remission 02/26/2022   Left forearm pain 02/26/2022   Alcohol withdrawal hallucinosis (HCC) 02/26/2022   Abnormal urine odor 02/26/2022    Frequent falls 02/26/2022   Memory changes 02/26/2022   Hypotension 11/20/2021   Malaise 11/20/2021   History of pyelonephritis 11/20/2021   Elevated lipase 06/17/2021   Fatty liver 06/17/2021   Common bile duct dilatation 06/17/2021   Hyponatremia 06/10/2021   Anorexia 06/10/2021   Adult failure to thrive 06/10/2021   Acute lower UTI 06/10/2021   Abnormal MRI of head 05/19/2021   Mild episode of recurrent major depressive disorder 03/17/2021   Stuttering 03/17/2021   Elevated LDL cholesterol level 03/17/2021   Word finding difficulty 03/17/2021   Sleep talking 03/17/2021   History of gastric ulcer 04/09/2019   RLS (restless legs syndrome) 04/09/2019   Abnormal weight gain 03/16/2017   BMI 27.0-27.9,adult 03/16/2017   Breast pain, right 09/14/2016   Primary osteoarthritis of left hand 02/27/2016   Depression 09/03/2015   Hiatal hernia 07/16/2015   Post-menopausal 06/20/2015   Family history of cardiovascular disease 06/20/2015   Drug induced constipation 06/20/2015   No energy 06/20/2015   Dysuria 06/20/2015   Status post implantation of urinary electronic stimulator device 05/05/2015   Pain in joint, ankle and foot 04/21/2015   Incontinence in female 06/26/2014   Acute bilateral lower abdominal pain 06/26/2014   Primary osteoarthritis of right knee 12/20/2013   Chronic, continuous use of opioids 09/28/2013   Interstitial cystitis (chronic) with hematuria 04/13/2013   Lower abdominal pain 07/26/2012   Chronic pain syndrome 06/21/2012   Fibromyalgia 06/21/2012   Carpal tunnel syndrome, bilateral 06/21/2012   Degenerative disc disease, cervical 06/21/2012   Facet arthropathy, cervical 06/21/2012   DDD (degenerative disc disease), lumbosacral 06/21/2012   EBV infection 04/21/2012   Lymphocytic colitis 12/01/2011   Depressive disorder,  not elsewhere classified 11/06/2010   NEPHROLITHIASIS 03/06/2010   OVARIAN CYST 03/06/2010   Diarrhea 11/04/2009   TOBACCO ABUSE  05/28/2009   Mitral valve disease 05/28/2009   Smoker 05/28/2009   Gastric ulcer 05/26/2009   History of cardiovascular disorder 05/26/2009   Cardiac dysrhythmia 05/16/2009   PALPITATIONS 05/16/2009   HEMATURIA UNSPECIFIED 12/19/2008   TMJ PAIN 07/26/2008    Past Medical History:  Diagnosis Date   Anxiety    Cancer (HCC)    cervical   Colitis    lymphacitic   Depression    Dysrhythmia    pvc    Gastric ulcer    Headache(784.0)    Heart murmur    IBS (irritable bowel syndrome)    Interstitial cystitis    MVP (mitral valve prolapse)    Urinary calculus or stone     Family History  Problem Relation Age of Onset   Heart attack Mother    Cirrhosis Mother    Cancer Father        tongue   Cirrhosis Father    Cancer Paternal Uncle    Colon cancer Neg Hx    Esophageal cancer Neg Hx    Rectal cancer Neg Hx    Stomach cancer Neg Hx    Breast cancer Neg Hx    Past Surgical History:  Procedure Laterality Date   APPENDECTOMY  06/15/2003   BILATERAL OOPHORECTOMY  06/14/2010   DILATION AND CURETTAGE OF UTERUS     x 4  one after each SAB   EXPLORATORY LAPAROTOMY  06/14/2005   LAPAROSCOPY N/A 08/04/2012   Procedure: LAPAROSCOPY DIAGNOSTIC;  Surgeon: Camellia CHRISTELLA Blush, MD,FACS;  Location: [facility];  Service: General;  Laterality: N/A;   TONSILLECTOMY  30 years ago   TOTAL ABDOMINAL HYSTERECTOMY  06/15/2003   TUBAL LIGATION  06/14/1998   Social History[1] Social History   Social History Narrative   Lives with her husband. She has two children. She enjoys collecting gemstones and selling them.     Immunization History  Administered Date(s) Administered   Influenza, Seasonal, Injecte, Preservative Fre 05/20/2023, 02/17/2024   Influenza,inj,Quad PF,6+ Mos 02/24/2015, 03/22/2016, 03/16/2017, 04/18/2018, 03/17/2021   Influenza,inj,quad, With Preservative 03/22/2016   Influenza-Unspecified 06/10/2012, 01/27/2022   PPD Test 05/29/2012   Td 05/16/2009   Tdap 09/08/2011      Objective: Vital Signs: There were no vitals taken for this visit.   Physical Exam   Musculoskeletal Exam: ***  CDAI Exam: CDAI Score: -- Patient Global: --; Provider Global: -- Swollen: --; Tender: -- Joint Exam 08/03/2024   No joint exam has been documented for this visit   There is currently no information documented on the homunculus. Go to the Rheumatology activity and complete the homunculus joint exam.  Investigation: No additional findings.  Imaging: No results found.  Recent Labs: Lab Results  Component Value Date   WBC 11.7 (H) 02/17/2024   HGB 14.7 02/17/2024   PLT 355 02/17/2024   NA 142 02/17/2024   K 4.5 02/17/2024   CL 104 02/17/2024   CO2 22 02/17/2024   GLUCOSE 110 (H) 02/17/2024   BUN 23 02/17/2024   CREATININE 1.10 (H) 02/17/2024   BILITOT 0.3 02/17/2024   ALKPHOS 106 02/17/2024   AST 22 02/17/2024   ALT 17 02/17/2024   PROT 6.7 02/17/2024   ALBUMIN 4.0 02/17/2024   CALCIUM 9.1 02/17/2024   GFRAA 91 08/29/2020   February 17, 2024 rheumatoid factor I20.6, anti-CCP negative, CRP 8, sed rate 24, ANA  negative Speciality Comments: No specialty comments available.  Procedures:  No procedures performed Allergies: Gabapentin, Lyrica  [pregabalin ], Ambien [zolpidem], Amitriptyline, Bactrim  [sulfamethoxazole -trimethoprim ], and Oxycodone    Assessment / Plan:     Visit Diagnoses: Rheumatoid factor positive  Polyarthralgia  Primary osteoarthritis of left hand  Primary osteoarthritis of right knee  Carpal tunnel syndrome, bilateral  Degenerative disc disease, cervical  Degeneration of intervertebral disc of lumbosacral region without discogenic pain or lower extremity pain  Fibromyalgia  Osteopenia of multiple sites  Chronic pain syndrome  Chronic, continuous use of opioids  Elevated LDL cholesterol level  Fatty liver  Hiatal hernia  History of gastric ulcer  Lymphocytic colitis  Drug induced constipation  History of  pancreatitis  History of kidney stones  Adenomatous polyp of colon, unspecified part of colon  MVP (mitral valve prolapse)  Adjustment disorder with mixed anxiety and depressed mood  Alcohol abuse, in remission  Family history of rheumatoid arthritis  Interstitial cystitis (chronic) with hematuria  NEPHROLITHIASIS  Memory changes  Word finding difficulty  Frequent falls  RLS (restless legs syndrome)  PTSD (post-traumatic stress disorder)  TOBACCO ABUSE  Orders: No orders of the defined types were placed in this encounter.  No orders of the defined types were placed in this encounter.   Face-to-face time spent with patient was *** minutes. Greater than 50% of time was spent in counseling and coordination of care.  Follow-Up Instructions: No follow-ups on file.   Maya Nash, MD  Note - This record has been created using Animal nutritionist.  Chart creation errors have been sought, but may not always  have been located. Such creation errors do not reflect on  the standard of medical care.    [1]  Social History Tobacco Use   Smoking status: Every Day    Current packs/day: 1.00    Average packs/day: 1 pack/day for 45.5 years (45.5 ttl pk-yrs)    Types: Cigarettes    Start date: 01/19/1979   Smokeless tobacco: Never   Tobacco comments:    03/24/22 Started smoking 0.5 pack per day since October, 2022.  Vaping Use   Vaping status: Never Used  Substance Use Topics   Alcohol use: Yes    Comment: rarely   Drug use: No   "

## 2024-08-03 ENCOUNTER — Encounter: Admitting: Rheumatology

## 2024-08-03 DIAGNOSIS — M19042 Primary osteoarthritis, left hand: Secondary | ICD-10-CM

## 2024-08-03 DIAGNOSIS — R296 Repeated falls: Secondary | ICD-10-CM

## 2024-08-03 DIAGNOSIS — Z8719 Personal history of other diseases of the digestive system: Secondary | ICD-10-CM

## 2024-08-03 DIAGNOSIS — R7689 Other specified abnormal immunological findings in serum: Secondary | ICD-10-CM

## 2024-08-03 DIAGNOSIS — M5030 Other cervical disc degeneration, unspecified cervical region: Secondary | ICD-10-CM

## 2024-08-03 DIAGNOSIS — Z8711 Personal history of peptic ulcer disease: Secondary | ICD-10-CM

## 2024-08-03 DIAGNOSIS — K760 Fatty (change of) liver, not elsewhere classified: Secondary | ICD-10-CM

## 2024-08-03 DIAGNOSIS — E7800 Pure hypercholesterolemia, unspecified: Secondary | ICD-10-CM

## 2024-08-03 DIAGNOSIS — Z8261 Family history of arthritis: Secondary | ICD-10-CM

## 2024-08-03 DIAGNOSIS — M2550 Pain in unspecified joint: Secondary | ICD-10-CM

## 2024-08-03 DIAGNOSIS — F1011 Alcohol abuse, in remission: Secondary | ICD-10-CM

## 2024-08-03 DIAGNOSIS — G2581 Restless legs syndrome: Secondary | ICD-10-CM

## 2024-08-03 DIAGNOSIS — N3011 Interstitial cystitis (chronic) with hematuria: Secondary | ICD-10-CM

## 2024-08-03 DIAGNOSIS — G894 Chronic pain syndrome: Secondary | ICD-10-CM

## 2024-08-03 DIAGNOSIS — F1190 Opioid use, unspecified, uncomplicated: Secondary | ICD-10-CM

## 2024-08-03 DIAGNOSIS — M51379 Other intervertebral disc degeneration, lumbosacral region without mention of lumbar back pain or lower extremity pain: Secondary | ICD-10-CM

## 2024-08-03 DIAGNOSIS — M8589 Other specified disorders of bone density and structure, multiple sites: Secondary | ICD-10-CM

## 2024-08-03 DIAGNOSIS — K449 Diaphragmatic hernia without obstruction or gangrene: Secondary | ICD-10-CM

## 2024-08-03 DIAGNOSIS — K52832 Lymphocytic colitis: Secondary | ICD-10-CM

## 2024-08-03 DIAGNOSIS — M797 Fibromyalgia: Secondary | ICD-10-CM

## 2024-08-03 DIAGNOSIS — F4310 Post-traumatic stress disorder, unspecified: Secondary | ICD-10-CM

## 2024-08-03 DIAGNOSIS — Z87442 Personal history of urinary calculi: Secondary | ICD-10-CM

## 2024-08-03 DIAGNOSIS — N200 Calculus of kidney: Secondary | ICD-10-CM

## 2024-08-03 DIAGNOSIS — M1711 Unilateral primary osteoarthritis, right knee: Secondary | ICD-10-CM

## 2024-08-03 DIAGNOSIS — G5603 Carpal tunnel syndrome, bilateral upper limbs: Secondary | ICD-10-CM

## 2024-08-03 DIAGNOSIS — R4789 Other speech disturbances: Secondary | ICD-10-CM

## 2024-08-03 DIAGNOSIS — D126 Benign neoplasm of colon, unspecified: Secondary | ICD-10-CM

## 2024-08-03 DIAGNOSIS — K5903 Drug induced constipation: Secondary | ICD-10-CM

## 2024-08-03 DIAGNOSIS — F17200 Nicotine dependence, unspecified, uncomplicated: Secondary | ICD-10-CM

## 2024-08-03 DIAGNOSIS — I341 Nonrheumatic mitral (valve) prolapse: Secondary | ICD-10-CM

## 2024-08-03 DIAGNOSIS — R413 Other amnesia: Secondary | ICD-10-CM

## 2024-08-03 DIAGNOSIS — F4323 Adjustment disorder with mixed anxiety and depressed mood: Secondary | ICD-10-CM

## 2024-08-31 ENCOUNTER — Ambulatory Visit: Admitting: Rheumatology
